# Patient Record
Sex: Male | Born: 1945 | Race: Black or African American | Hispanic: No | Marital: Married | Smoking: Former smoker
Health system: Southern US, Community
[De-identification: ages and names within clinical notes are randomized; demographics above are authoritative.]

## PROBLEM LIST (undated history)

## (undated) DIAGNOSIS — N529 Male erectile dysfunction, unspecified: Secondary | ICD-10-CM

## (undated) DIAGNOSIS — J449 Chronic obstructive pulmonary disease, unspecified: Secondary | ICD-10-CM

## (undated) DIAGNOSIS — R0902 Hypoxemia: Secondary | ICD-10-CM

## (undated) DIAGNOSIS — I739 Peripheral vascular disease, unspecified: Secondary | ICD-10-CM

## (undated) DIAGNOSIS — R0600 Dyspnea, unspecified: Secondary | ICD-10-CM

## (undated) DIAGNOSIS — R0989 Other specified symptoms and signs involving the circulatory and respiratory systems: Secondary | ICD-10-CM

## (undated) DIAGNOSIS — I272 Pulmonary hypertension, unspecified: Secondary | ICD-10-CM

## (undated) DIAGNOSIS — E785 Hyperlipidemia, unspecified: Secondary | ICD-10-CM

## (undated) DIAGNOSIS — J45909 Unspecified asthma, uncomplicated: Secondary | ICD-10-CM

## (undated) DIAGNOSIS — Z87442 Personal history of urinary calculi: Secondary | ICD-10-CM

## (undated) DIAGNOSIS — I10 Essential (primary) hypertension: Secondary | ICD-10-CM

## (undated) DIAGNOSIS — R0609 Other forms of dyspnea: Secondary | ICD-10-CM

## (undated) DIAGNOSIS — E119 Type 2 diabetes mellitus without complications: Secondary | ICD-10-CM

## (undated) HISTORY — PX: OTHER SURGICAL HISTORY: SHX169

## (undated) HISTORY — DX: Personal history of urinary calculi: Z87.442

## (undated) HISTORY — DX: Other specified symptoms and signs involving the circulatory and respiratory systems: R09.89

## (undated) HISTORY — DX: Male erectile dysfunction, unspecified: N52.9

## (undated) HISTORY — DX: Essential (primary) hypertension: I10

## (undated) HISTORY — DX: Peripheral vascular disease, unspecified: I73.9

## (undated) HISTORY — DX: Type 2 diabetes mellitus without complications: E11.9

## (undated) HISTORY — DX: Hyperlipidemia, unspecified: E78.5

---

## 1993-11-11 HISTORY — PX: ROTATOR CUFF REPAIR: SHX139

## 2007-11-06 ENCOUNTER — Inpatient Hospital Stay (HOSPITAL_COMMUNITY): Admission: EM | Admit: 2007-11-06 | Discharge: 2007-11-08 | Payer: Self-pay | Admitting: Emergency Medicine

## 2007-11-07 ENCOUNTER — Ambulatory Visit: Payer: Self-pay | Admitting: Internal Medicine

## 2007-11-09 ENCOUNTER — Ambulatory Visit (HOSPITAL_COMMUNITY): Admission: RE | Admit: 2007-11-09 | Discharge: 2007-11-09 | Payer: Self-pay | Admitting: Internal Medicine

## 2007-11-19 ENCOUNTER — Ambulatory Visit: Payer: Self-pay | Admitting: Internal Medicine

## 2007-11-25 ENCOUNTER — Ambulatory Visit (HOSPITAL_COMMUNITY): Admission: RE | Admit: 2007-11-25 | Discharge: 2007-11-25 | Payer: Self-pay | Admitting: Internal Medicine

## 2007-11-25 ENCOUNTER — Encounter: Payer: Self-pay | Admitting: Internal Medicine

## 2007-11-25 ENCOUNTER — Ambulatory Visit: Payer: Self-pay | Admitting: Internal Medicine

## 2007-11-25 HISTORY — PX: COLONOSCOPY: SHX174

## 2007-12-01 ENCOUNTER — Ambulatory Visit (HOSPITAL_COMMUNITY): Admission: RE | Admit: 2007-12-01 | Discharge: 2007-12-01 | Payer: Self-pay | Admitting: Urology

## 2007-12-28 ENCOUNTER — Encounter (INDEPENDENT_AMBULATORY_CARE_PROVIDER_SITE_OTHER): Payer: Self-pay | Admitting: *Deleted

## 2008-01-20 ENCOUNTER — Ambulatory Visit (HOSPITAL_COMMUNITY): Admission: RE | Admit: 2008-01-20 | Discharge: 2008-01-20 | Payer: Self-pay | Admitting: Urology

## 2008-01-21 ENCOUNTER — Ambulatory Visit (HOSPITAL_COMMUNITY): Admission: RE | Admit: 2008-01-21 | Discharge: 2008-01-21 | Payer: Self-pay | Admitting: Urology

## 2008-02-17 ENCOUNTER — Ambulatory Visit (HOSPITAL_COMMUNITY): Admission: RE | Admit: 2008-02-17 | Discharge: 2008-02-17 | Payer: Self-pay | Admitting: Urology

## 2008-03-01 ENCOUNTER — Ambulatory Visit: Payer: Self-pay | Admitting: Family Medicine

## 2008-03-01 LAB — CONVERTED CEMR LAB: Microalb, Ur: 1.09 mg/dL (ref 0.00–1.89)

## 2008-03-03 ENCOUNTER — Encounter: Payer: Self-pay | Admitting: Family Medicine

## 2008-03-03 LAB — CONVERTED CEMR LAB
AST: 13 units/L (ref 0–37)
Alkaline Phosphatase: 53 units/L (ref 39–117)
Bilirubin, Direct: 0.1 mg/dL (ref 0.0–0.3)
Calcium: 9.6 mg/dL (ref 8.4–10.5)
Cholesterol: 219 mg/dL — ABNORMAL HIGH (ref 0–200)
Eosinophils Absolute: 0.1 10*3/uL (ref 0.0–0.7)
Eosinophils Relative: 2 % (ref 0–5)
HDL: 31 mg/dL — ABNORMAL LOW (ref >39)
Hemoglobin: 15 g/dL (ref 13.0–17.0)
Indirect Bilirubin: 0.5 mg/dL (ref 0.0–0.9)
LDL Cholesterol: 167 mg/dL — ABNORMAL HIGH (ref 0–99)
Lymphs Abs: 2.6 10*3/uL (ref 0.7–4.0)
MCV: 88.7 fL (ref 78.0–100.0)
Potassium: 4.3 meq/L (ref 3.5–5.3)
RBC: 5.03 M/uL (ref 4.22–5.81)
RDW: 13.9 % (ref 11.5–15.5)
Total Bilirubin: 0.6 mg/dL (ref 0.3–1.2)
Total CHOL/HDL Ratio: 7.1
Total Protein: 7.4 g/dL (ref 6.0–8.3)
Triglycerides: 104 mg/dL (ref <150)
VLDL: 21 mg/dL (ref 0–40)

## 2008-03-09 ENCOUNTER — Encounter (INDEPENDENT_AMBULATORY_CARE_PROVIDER_SITE_OTHER): Payer: Self-pay | Admitting: *Deleted

## 2008-03-09 DIAGNOSIS — I10 Essential (primary) hypertension: Secondary | ICD-10-CM

## 2008-05-19 ENCOUNTER — Ambulatory Visit (HOSPITAL_COMMUNITY): Admission: RE | Admit: 2008-05-19 | Discharge: 2008-05-19 | Payer: Self-pay | Admitting: Urology

## 2008-06-06 ENCOUNTER — Ambulatory Visit: Payer: Self-pay | Admitting: Family Medicine

## 2008-06-06 ENCOUNTER — Encounter: Payer: Self-pay | Admitting: Family Medicine

## 2008-06-10 ENCOUNTER — Encounter: Payer: Self-pay | Admitting: Family Medicine

## 2008-06-10 LAB — CONVERTED CEMR LAB
Calcium: 9.4 mg/dL (ref 8.4–10.5)
Chloride: 105 meq/L (ref 96–112)
Cholesterol: 145 mg/dL (ref 0–200)
Creatinine, Ser: 0.94 mg/dL (ref 0.40–1.50)
HDL: 30 mg/dL — ABNORMAL LOW (ref >39)
Total Bilirubin: 0.6 mg/dL (ref 0.3–1.2)
Total Protein: 7.4 g/dL (ref 6.0–8.3)
Triglycerides: 97 mg/dL (ref <150)

## 2008-09-08 ENCOUNTER — Ambulatory Visit: Payer: Self-pay | Admitting: Family Medicine

## 2008-09-08 LAB — CONVERTED CEMR LAB: Hgb A1c MFr Bld: 7.1 %

## 2008-11-11 HISTORY — PX: LITHOTRIPSY: SUR834

## 2008-12-14 ENCOUNTER — Ambulatory Visit: Payer: Self-pay | Admitting: Family Medicine

## 2008-12-14 DIAGNOSIS — N3090 Cystitis, unspecified without hematuria: Secondary | ICD-10-CM | POA: Insufficient documentation

## 2008-12-14 DIAGNOSIS — E785 Hyperlipidemia, unspecified: Secondary | ICD-10-CM | POA: Insufficient documentation

## 2008-12-14 DIAGNOSIS — F528 Other sexual dysfunction not due to a substance or known physiological condition: Secondary | ICD-10-CM

## 2008-12-14 LAB — CONVERTED CEMR LAB: Hgb A1c MFr Bld: 7.8 %

## 2008-12-15 LAB — CONVERTED CEMR LAB
BUN: 13 mg/dL (ref 6–23)
Bilirubin Urine: NEGATIVE
CO2: 23 meq/L (ref 19–32)
Calcium: 9.5 mg/dL (ref 8.4–10.5)
Chloride: 103 meq/L (ref 96–112)
Cholesterol: 137 mg/dL (ref 0–200)
Glucose, Bld: 144 mg/dL — ABNORMAL HIGH (ref 70–99)
HDL: 31 mg/dL — ABNORMAL LOW (ref >39)
Hemoglobin, Urine: NEGATIVE
Ketones, ur: NEGATIVE mg/dL
LDL Cholesterol: 77 mg/dL (ref 0–99)
Nitrite: NEGATIVE
Total CHOL/HDL Ratio: 4.4
Total Protein: 7.5 g/dL (ref 6.0–8.3)
Triglycerides: 145 mg/dL (ref <150)
Urine Glucose: NEGATIVE mg/dL
VLDL: 29 mg/dL (ref 0–40)
pH: 7 (ref 5.0–8.0)

## 2009-01-03 ENCOUNTER — Encounter: Payer: Self-pay | Admitting: Family Medicine

## 2009-03-15 ENCOUNTER — Ambulatory Visit: Payer: Self-pay | Admitting: Family Medicine

## 2009-03-15 LAB — CONVERTED CEMR LAB
Blood Glucose, Fasting: 167 mg/dL
Hgb A1c MFr Bld: 7.1 %

## 2009-03-16 ENCOUNTER — Encounter: Payer: Self-pay | Admitting: Family Medicine

## 2009-03-17 LAB — CONVERTED CEMR LAB
Creatinine, Urine: 233.6 mg/dL
Eosinophils Absolute: 0.2 10*3/uL (ref 0.0–0.7)
Eosinophils Relative: 2 % (ref 0–5)
HCT: 47 % (ref 39.0–52.0)
Lymphocytes Relative: 31 % (ref 12–46)
Lymphs Abs: 2.5 10*3/uL (ref 0.7–4.0)
Microalb Creat Ratio: 11.3 mg/g (ref 0.0–30.0)
Microalb, Ur: 2.63 mg/dL — ABNORMAL HIGH (ref 0.00–1.89)
Monocytes Absolute: 0.6 10*3/uL (ref 0.1–1.0)
Neutro Abs: 4.7 10*3/uL (ref 1.7–7.7)
Neutrophils Relative %: 59 % (ref 43–77)
RDW: 14.4 % (ref 11.5–15.5)

## 2009-04-26 ENCOUNTER — Ambulatory Visit: Payer: Self-pay | Admitting: Family Medicine

## 2009-05-01 DIAGNOSIS — K5289 Other specified noninfective gastroenteritis and colitis: Secondary | ICD-10-CM

## 2009-06-20 ENCOUNTER — Ambulatory Visit (HOSPITAL_COMMUNITY): Admission: RE | Admit: 2009-06-20 | Discharge: 2009-06-20 | Payer: Self-pay | Admitting: Family Medicine

## 2009-06-21 ENCOUNTER — Encounter: Payer: Self-pay | Admitting: Family Medicine

## 2009-06-21 LAB — CONVERTED CEMR LAB
ALT: 14 units/L (ref 0–53)
Calcium: 9.7 mg/dL (ref 8.4–10.5)
Chloride: 107 meq/L (ref 96–112)
Cholesterol: 137 mg/dL (ref 0–200)
Indirect Bilirubin: 0.4 mg/dL (ref 0.0–0.9)
Potassium: 4.5 meq/L (ref 3.5–5.3)
Sodium: 143 meq/L (ref 135–145)
Total Bilirubin: 0.5 mg/dL (ref 0.3–1.2)
Total Protein: 7.1 g/dL (ref 6.0–8.3)
Triglycerides: 72 mg/dL (ref <150)

## 2009-07-03 ENCOUNTER — Ambulatory Visit: Payer: Self-pay | Admitting: Family Medicine

## 2009-10-24 ENCOUNTER — Ambulatory Visit: Payer: Self-pay | Admitting: Family Medicine

## 2009-10-24 LAB — CONVERTED CEMR LAB
Glucose, Bld: 167 mg/dL
OCCULT 1: NEGATIVE

## 2009-10-27 LAB — CONVERTED CEMR LAB
ALT: 10 units/L (ref 0–53)
Albumin: 4.7 g/dL (ref 3.5–5.2)
BUN: 12 mg/dL (ref 6–23)
Bilirubin, Direct: 0.1 mg/dL (ref 0.0–0.3)
CO2: 23 meq/L (ref 19–32)
Chloride: 106 meq/L (ref 96–112)
Cholesterol: 160 mg/dL (ref 0–200)
Glucose, Bld: 142 mg/dL — ABNORMAL HIGH (ref 70–99)
HDL: 34 mg/dL — ABNORMAL LOW (ref >39)
Indirect Bilirubin: 0.2 mg/dL (ref 0.0–0.9)
Potassium: 4.4 meq/L (ref 3.5–5.3)
Total Bilirubin: 0.3 mg/dL (ref 0.3–1.2)
Total Protein: 7.6 g/dL (ref 6.0–8.3)

## 2009-11-02 ENCOUNTER — Ambulatory Visit: Payer: Self-pay | Admitting: Cardiology

## 2009-11-02 DIAGNOSIS — F17200 Nicotine dependence, unspecified, uncomplicated: Secondary | ICD-10-CM

## 2009-11-06 DIAGNOSIS — R0989 Other specified symptoms and signs involving the circulatory and respiratory systems: Secondary | ICD-10-CM | POA: Insufficient documentation

## 2009-11-07 ENCOUNTER — Ambulatory Visit: Payer: Self-pay | Admitting: Cardiology

## 2009-11-07 ENCOUNTER — Encounter (INDEPENDENT_AMBULATORY_CARE_PROVIDER_SITE_OTHER): Payer: Self-pay | Admitting: *Deleted

## 2009-11-07 DIAGNOSIS — R9439 Abnormal result of other cardiovascular function study: Secondary | ICD-10-CM

## 2009-11-14 ENCOUNTER — Encounter: Payer: Self-pay | Admitting: Cardiology

## 2009-11-14 ENCOUNTER — Ambulatory Visit (HOSPITAL_COMMUNITY): Admission: RE | Admit: 2009-11-14 | Discharge: 2009-11-14 | Payer: Self-pay | Admitting: Family Medicine

## 2009-11-28 ENCOUNTER — Ambulatory Visit: Payer: Self-pay | Admitting: Family Medicine

## 2009-11-28 ENCOUNTER — Encounter: Payer: Self-pay | Admitting: Cardiology

## 2009-11-28 ENCOUNTER — Ambulatory Visit: Payer: Self-pay | Admitting: Cardiology

## 2009-11-28 ENCOUNTER — Ambulatory Visit (HOSPITAL_COMMUNITY): Admission: RE | Admit: 2009-11-28 | Discharge: 2009-11-28 | Payer: Self-pay | Admitting: Cardiology

## 2010-01-25 ENCOUNTER — Ambulatory Visit: Payer: Self-pay | Admitting: Family Medicine

## 2010-01-25 DIAGNOSIS — R5381 Other malaise: Secondary | ICD-10-CM

## 2010-01-25 DIAGNOSIS — R5383 Other fatigue: Secondary | ICD-10-CM

## 2010-01-29 ENCOUNTER — Encounter: Payer: Self-pay | Admitting: Family Medicine

## 2010-05-31 ENCOUNTER — Ambulatory Visit: Payer: Self-pay | Admitting: Family Medicine

## 2010-05-31 DIAGNOSIS — I739 Peripheral vascular disease, unspecified: Secondary | ICD-10-CM

## 2010-05-31 DIAGNOSIS — L259 Unspecified contact dermatitis, unspecified cause: Secondary | ICD-10-CM

## 2010-06-01 LAB — CONVERTED CEMR LAB
BUN: 14 mg/dL (ref 6–23)
Basophils Absolute: 0 10*3/uL (ref 0.0–0.1)
Eosinophils Absolute: 0.2 10*3/uL (ref 0.0–0.7)
Eosinophils Relative: 2 % (ref 0–5)
HCT: 45.1 % (ref 39.0–52.0)
HDL: 30 mg/dL — ABNORMAL LOW (ref >39)
Hemoglobin: 15.1 g/dL (ref 13.0–17.0)
LDL Cholesterol: 94 mg/dL (ref 0–99)
Lymphocytes Relative: 27 % (ref 12–46)
Lymphs Abs: 2.2 10*3/uL (ref 0.7–4.0)
MCHC: 33.5 g/dL (ref 30.0–36.0)
Microalb Creat Ratio: 8.9 mg/g (ref 0.0–30.0)
Microalb, Ur: 1.66 mg/dL (ref 0.00–1.89)
Monocytes Absolute: 0.4 10*3/uL (ref 0.1–1.0)
Neutro Abs: 5.4 10*3/uL (ref 1.7–7.7)
Neutrophils Relative %: 66 % (ref 43–77)
PSA: 0.37 ng/mL (ref 0.10–4.00)
Potassium: 4.6 meq/L (ref 3.5–5.3)
RBC: 5.06 M/uL (ref 4.22–5.81)
Sodium: 140 meq/L (ref 135–145)

## 2010-06-07 ENCOUNTER — Ambulatory Visit (HOSPITAL_COMMUNITY)
Admission: RE | Admit: 2010-06-07 | Discharge: 2010-06-07 | Payer: Self-pay | Source: Home / Self Care | Admitting: Family Medicine

## 2010-06-16 ENCOUNTER — Encounter: Payer: Self-pay | Admitting: Family Medicine

## 2010-08-02 ENCOUNTER — Ambulatory Visit: Payer: Self-pay | Admitting: Family Medicine

## 2010-08-27 ENCOUNTER — Telehealth: Payer: Self-pay | Admitting: Family Medicine

## 2010-08-27 ENCOUNTER — Ambulatory Visit: Payer: Self-pay | Admitting: Family Medicine

## 2010-08-27 DIAGNOSIS — J209 Acute bronchitis, unspecified: Secondary | ICD-10-CM | POA: Insufficient documentation

## 2010-08-27 DIAGNOSIS — J019 Acute sinusitis, unspecified: Secondary | ICD-10-CM | POA: Insufficient documentation

## 2010-09-14 ENCOUNTER — Ambulatory Visit: Payer: Self-pay | Admitting: Family Medicine

## 2010-09-17 LAB — CONVERTED CEMR LAB
BUN: 14 mg/dL (ref 6–23)
Calcium: 10.2 mg/dL (ref 8.4–10.5)
Creatinine, Ser: 0.94 mg/dL (ref 0.40–1.50)
Glucose, Bld: 142 mg/dL — ABNORMAL HIGH (ref 70–99)
Potassium: 4.7 meq/L (ref 3.5–5.3)
Sodium: 145 meq/L (ref 135–145)
Testosterone: 295.67 ng/dL (ref 250–890)

## 2010-12-11 NOTE — Assessment & Plan Note (Signed)
Summary: cold   Vital Signs:  Patient profile:   65 year old male Weight:      195.04 pounds Pulse rhythm:   regular Resp:     14 per minute BP sitting:   150 / 90  (right arm)  Primary Care Provider:  Dr. Tula Nakayama   History of Present Illness: 1 week h/o  cough and chest congestion, also has has clear nasal drainage. No fever or chills.Symptoms are worsening anfd at times his sputum is yellow. e has sleep disturbance and fatigue. Prior to tis he hd been doing well.Reports  that they are doing well.  Denies chest pain, palpitations, PND, orthopnea or leg swelling. Denies abdominal pain, nausea, vomitting, diarrhea or constipation. Denies change in bowel movements or bloody stool. Denies dysuria , frequency, incontinence or hesitancy. Denies  joint pain, swelling, or reduced mobility. Denies headaches, vertigo, seizures. Denies depression, anxiety or insomnia. Denies  rash, lesions, or itch.     Allergies: No Known Drug Allergies  Review of Systems      See HPI General:  Complains of chills, fatigue, malaise, and sleep disorder. ENT:  Complains of nasal congestion, postnasal drainage, and sinus pressure. Resp:  Complains of cough, shortness of breath, sputum productive, and wheezing. Endo:  Denies excessive thirst and excessive urination. Heme:  Denies abnormal bruising and bleeding. Allergy:  Denies hives or rash and itching eyes.  Physical Exam  General:  Well-developed,well-nourished,in no acute distress; alert,appropriate and cooperative throughout examinationill appearing. HEENT: No facial asymmetry,  EOMI, frontal and maxillary  sinus tenderness, TM's Clear, oropharynx  pink and moist.   Chest: decreased air entry, scattered crackles and wheezes.  CVS: S1, S2, No murmurs, No S3.   Abd: Soft, Nontender.  MS: Adequate ROM spine, hips, shoulders and knees.  Ext: No edema.   CNS: CN 2-12 intact, power tone and sensation normal throughout.   Skin:  Intact,decreased hyperpigmentation of the skin on his legs, no ulceration Psych: Good eye contact, normal affect.  Memory intact, not anxious or depressed appearing.    Impression & Recommendations:  Problem # 1:  ACUTE BRONCHITIS (ICD-466.0) Assessment Comment Only  His updated medication list for this problem includes:    Penicillin V Potassium 500 Mg Tabs (Penicillin v potassium) .[name]... Take 1 tablet by mouth three times a day  Orders: Depo- Medrol 78m (J1040) Rocephin  2543m([id number] of Therapeutic Inj  intramuscular or subcutaneous ([id number] Electronic Prescription ([id number] Problem # 2:  ACUTE SINUSITIS, UNSPECIFIED (ICD-461.9) Assessment: Comment Only  His updated medication list for this problem includes:    Penicillin V Potassium 500 Mg Tabs (Penicillin v potassium) ...[name]. Take 1 tablet by mouth three times a day  Problem # 3:  DERMATITIS (ICD-692.9) Assessment: Improved  Problem # 4:  HYPERTENSION (ICD-401.9) Assessment: Improved  His updated medication list for this problem includes:    Lotensin 40 Mg Tabs (Benazepril hcl) ...[name]. Take 1 tablet by mouth once a day    Amlodipine Besylate 2.5 Mg Tabs (Amlodipine besylate) ...[name]. Take 1 tablet by mouth once a day Patient advised to follow low sodium diet rich in fruit and vegetables, and to commit to at least 30 minutes 5 days per week of regular exercise , to improve blood presure control.   Orders: T-Basic Metabolic Panel ([id number] BP today: 150/90 Prior BP: 160/100 (08/02/2010)  Prior 10 Yr Risk Heart Disease: 47 % (11/07/2009)  Labs Reviewed: K+: 4.6 (05/31/2010) Creat: : 1.01 (05/31/2010)   Chol:  151 (05/31/2010)   HDL: 30 (05/31/2010)   LDL: 94 (05/31/2010)   TG: 137 (05/31/2010)  Problem # 5:  HYPERLIPIDEMIA (ICD-272.4) Assessment: Comment Only  His updated medication list for this problem includes:    Lovastatin 40 Mg Tabs (Lovastatin) .[name]... Take 1 tab by mouth at bedtime  Labs  Reviewed: SGOT: 13 (10/24/2009)   SGPT: 10 (10/24/2009)  Prior 10 Yr Risk Heart Disease: 47 % (11/07/2009)   HDL:30 (05/31/2010), 34 (10/24/2009)  LDL:94 (05/31/2010), 106 (10/24/2009)  Chol:151 (05/31/2010), 160 (10/24/2009)  Trig:137 (05/31/2010), 102 (10/24/2009)  Problem # 6:  DIABETES MELLITUS-NO INSULIN (ICD-250.00) Assessment: Comment Only  His updated medication list for this problem includes:    Metformin Hcl 1000 Mg Tabs (Metformin hcl) .[name]... Take 1 tablet by mouth two times a day    Lotensin 40 Mg Tabs (Benazepril hcl) .[name]... Take 1 tablet by mouth once a day Patient advised to reduce carbs and sweets, commit to regular physical activity, take meds as prescribed, test blood sugars as directed, and attempt to lose weight , to improve blood sugar control.  Orders: T- Hemoglobin A1C ([id number])  Labs Reviewed: Creat: 1.01 (05/31/2010)     Last Eye Exam: normal (10/24/2009) Reviewed HgBA1c results: 7.0 (05/31/2010)  6.7 (01/25/2010)  Problem # 7:  TOBACCO ABUSE (ICD-305.1) Assessment: Unchanged  Encouraged smoking cessation and discussed different methods for smoking cessation.   Complete Medication List: 1)  Lovastatin 40 Mg Tabs (Lovastatin) .... Take 1 tab by mouth at bedtime 2)  Metformin Hcl 1000 Mg Tabs (Metformin hcl) .... Take 1 tablet by mouth two times a day 3)  Lotensin 40 Mg Tabs (Benazepril hcl) .... Take 1 tablet by mouth once a day 4)  Amlodipine Besylate 2.5 Mg Tabs (Amlodipine besylate) .... Take 1 tablet by mouth once a day 5)  Penicillin V Potassium 500 Mg Tabs (Penicillin v potassium) .... Take 1 tablet by mouth three times a day  Other Orders: T-TSH ([id number])  Patient Instructions: 1)  f/u as before. 2)  you are being treated for uncontrolled allergies with flare of cOPD/bronchitis. 3)  you will  get rocephin and depomedrol in the office. 4)  Meds are also being sent in for infection and allergies pls take all. 5)  Delsyn 1 teaspoon 3 times  daily for 6 days, samples from the office  will be given Prescriptions: PREDNISONE (PAK) 5 MG TABS (PREDNISONE) Use as directed  #21 x 0   Entered and Authorized by:   Tula Nakayama MD   Signed by:   Tula Nakayama MD on 08/27/2010   Method used:   Electronically to        [facility] [phone]* (retail)       [address] [place]  [address]       Ph: [phone]       Fax: [phone]   RxID:   [id number] PENICILLIN V POTASSIUM 500 MG TABS (PENICILLIN V POTASSIUM) Take 1 tablet by mouth three times a day  #30 x 0   Entered and Authorized by:   Tula Nakayama MD   Signed by:   Tula Nakayama MD on 08/27/2010   Method used:   Electronically to        [facility] [phone]* (retail)       [address] [place]  [address]       Ph: [phone]  Fax: [phone]   RxID:   [id number]    Medication Administration  Injection # 1:    Medication: Depo- Medrol 61m    Diagnosis: ACUTE BRONCHITIS (ICD-466.0)    Route: IM    Site: R deltoid    Exp Date: 06/12    Lot #: [id number]    Mfr: Pharmacia    Patient tolerated injection without complications    Given by: JBaldomero LamyLPN (October 17, 270964:40 PM)  Injection # 2:    Medication: Rocephin  2552m   Diagnosis: ACUTE BRONCHITIS (ICD-466.0)    Route: IM    Site: RUOQ gluteus    Exp Date: 05/14    Lot #: [id number]  Mfr: novaplus    Comments: rocephin 50012miven    Patient tolerated injection without complications    Given by: JaiBaldomero LamyN (October 17, 201840341 PM)  Orders Added: 1)  Est. Patient Level IV [99214] 2)  T-Basic Metabolic Panel [[id number]]  T- Hemoglobin A1C [83036-23375] 4)  T-TSH [84[[id number]]  Depo- Medrol 23m55m1040] 6)  Rocephin  250mg38m696] 7)  Admin of Therapeutic Inj  intramuscular or subcutaneous [96372] 8)  Medicare Electronic Prescription [G855[H9093]Medication Administration  Injection # 1:    Medication: Depo-  Medrol 23mg 68miagnosis: ACUTE BRONCHITIS (ICD-466.0)    Route: IM    Site: R deltoid    Exp Date: 06/12    Lot #: [id number]    Mfr: Pharmacia    Patient tolerated injection without complications    Given by: Jaime Baldomero LamyOctober 17, 2011 41121PM)  Injection # 2:    Medication: Rocephin  250mg  19magnosis: ACUTE BRONCHITIS (ICD-466.0)    Route: IM    Site: RUOQ gluteus    Exp Date: 05/14    Lot #: [id number]: novaplus    Comments: rocephin 500mg gi53m   Patient tolerated injection without complications    Given by: Jaime BoBaldomero Lamytober 17, 2011 4:45072)  Orders Added: 1)  Est. Patient Level IV [99214] [25750]asic Metabolic Panel [80048-2[51833-58251]Hemoglobin A1C [83036-23375] 4)  T-TSH [84443-2[89842-10312]o- Medrol 23mg [J169m 6)  Rocephin  250mg [J0663m7)  Admin of Therapeutic Inj  intramuscular or subcutaneous [96372] 8)  Medicare Electronic Prescription [G8553][O1188]

## 2010-12-11 NOTE — Progress Notes (Signed)
Summary: sick  Phone Note Call from Patient   Summary of Call: pt thinks he has a sinus infection and wants to see doc. but no appts today. can he get a antibotic called in [phone] Initial call taken by: Lenn Cal,  August 27, 2010 11:09 AM  Follow-up for Phone Call        no fever, no chills or body aches  complains of cough runny nose, clear mucous  states been going on for a couple weeks has tried otc meds Follow-up by: Baldomero Lamy LPN,  August 27, 9233 11:57 AM  Additional Follow-up for Phone Call Additional follow up Details #1::        pls set up appt with PA Additional Follow-up by: Tula Nakayama MD,  August 27, 2010 1:09 PM    Additional Follow-up for Phone Call Additional follow up Details #2::    [place] 10.18.11 @ 8:30 AND HE SAID THAT HE WOULD JUST GO TO URGENT CARE . THERE IS NOTHING OPEN EVEN WITH DAWN Follow-up by: Dierdre Harness,  August 27, 2010 1:18 PM  Additional Follow-up for Phone Call Additional follow up Details #3:: Details for Additional Follow-up Action Taken: seen today Additional Follow-up by: Tula Nakayama MD,  August 27, 2010 5:13 PM

## 2010-12-11 NOTE — Assessment & Plan Note (Signed)
Summary: office visit   Vital Signs:  Patient profile:   65 year old male Height:      75 inches Weight:      195 pounds BMI:     24.46 O2 Sat:      96 % Pulse rate:   78 / minute Pulse rhythm:   regular Resp:     16 per minute BP sitting:   148 / 78  (left arm) Cuff size:   regular  Vitals Entered By: Kate Sable LPN (January 25, 5461 7:03 AM) CC: Follow up chronic problems   Primary Care Provider:  Dr. Tula Nakayama  CC:  Follow up chronic problems.  History of Present Illness: Reports  thathe has been  doing well. Denies recent fever or chills. Denies sinus pressure, nasal congestion , ear pain or sore throat. Denies chest congestion, or cough productive of sputum. Denies chest pain, palpitations, PND, orthopnea or leg swelling. Denies abdominal pain, nausea, vomitting, diarrhea or constipation. Denies change in bowel movements or bloody stool. Denies dysuria , frequency, incontinence or hesitancy. Denies  joint pain, swelling, or reduced mobility. Denies headaches, vertigo, seizures. Denies depression, anxiety or insomnia. Denies  rash, lesions, or itch.     Preventive Screening-Counseling & Management  Alcohol-Tobacco     Smoking Cessation Counseling: yes  Current Medications (verified): 1)  Lovastatin 40 Mg  Tabs (Lovastatin) .... Take 1 Tab By Mouth At Bedtime 2)  Metformin Hcl 1000 Mg  Tabs (Metformin Hcl) .... Take 1 Tablet By Mouth Two Times A Day 3)  Lotensin 40 Mg Tabs (Benazepril Hcl) .... Take 1 Tablet By Mouth Once A Day  Allergies (verified): No Known Drug Allergies  Review of Systems      See HPI Eyes:  Denies blurring, discharge, eye pain, and red eye. Endo:  Denies cold intolerance, excessive hunger, excessive thirst, excessive urination, heat intolerance, polyuria, and weight change. Heme:  Denies abnormal bruising and bleeding. Allergy:  Complains of seasonal allergies; denies hives or rash and itching eyes.  Physical Exam  General:   Well-developed,well-nourished,in no acute distress; alert,appropriate and cooperative throughout examination HEENT: No facial asymmetry,  EOMI, No sinus tenderness, TM's Clear, oropharynx  pink and moist.   Chest: Clear to auscultation bilaterally.  CVS: S1, S2, No murmurs, No S3.   Abd: Soft, Nontender.  MS: Adequate ROM spine, hips, shoulders and knees.  Ext: No edema.   CNS: CN 2-12 intact, power tone and sensation normal throughout.   Skin: Intact, no visible lesions or rashes.  Psych: Good eye contact, normal affect.  Memory intact, not anxious or depressed appearing.    Impression & Recommendations:  Problem # 1:  HYPERTENSION (ICD-401.9) Assessment Deteriorated  The following medications were removed from the medication list:    Chlorthalidone 25 Mg Tabs (Chlorthalidone) .[name]... Take one half  tablet by mouth daily His updated medication list for this problem includes:    Lotensin 40 Mg Tabs (Benazepril hcl) .[name]... Take 1 tablet by mouth once a day  Orders: T-Basic Metabolic Panel ([id number])  BP today: 148/78, has not taking med today Prior BP: 120/74 (11/28/2009)  Prior 10 Yr Risk Heart Disease: 47 % (11/07/2009)  Labs Reviewed: K+: 4.4 (10/24/2009) Creat: : 0.96 (10/24/2009)   Chol: 160 (10/24/2009)   HDL: 34 (10/24/2009)   LDL: 106 (10/24/2009)   TG: 102 (10/24/2009)  Problem # 2:  HYPERLIPIDEMIA (ICD-272.4) Assessment: Deteriorated  His updated medication list for this problem includes:    Lovastatin 40 Mg Tabs (  Lovastatin) .[name]... Take 1 tab by mouth at bedtime  Orders: T-Hepatic Function [id number]) T-Lipid Profile [phone])  Labs Reviewed: SGOT: 13 (10/24/2009)   SGPT: 10 (10/24/2009)  Prior 10 Yr Risk Heart Disease: 47 % (11/07/2009)   HDL:34 (10/24/2009), 31 (06/21/2009)  LDL:106 (10/24/2009), 92 (06/21/2009)  Chol:160 (10/24/2009), 137 (06/21/2009)  Trig:102 (10/24/2009), 72 (06/21/2009)  Problem # 3:  TOBACCO ABUSE (ICD-305.1) Assessment:  Unchanged  Encouraged smoking cessation and discussed different methods for smoking cessation.   Problem # 4:  DIABETES MELLITUS-NO INSULIN (ICD-250.00) Assessment: Comment Only  His updated medication list for this problem includes:    Metformin Hcl 1000 Mg Tabs (Metformin hcl) .[name]... Take 1 tablet by mouth two times a day    Lotensin 40 Mg Tabs (Benazepril hcl) .[name]... Take 1 tablet by mouth once a day  Orders: T- Hemoglobin A1C ([id number]) T- Hemoglobin A1C ([id number]) T-Urine Microalbumin w/creat. ratio [phone])  Labs Reviewed: Creat: 0.96 (10/24/2009)     Last Eye Exam: normal (10/24/2009) Reviewed HgBA1c results: 6.7 (10/24/2009)  6.7 (07/03/2009)  Complete Medication List: 1)  Lovastatin 40 Mg Tabs (Lovastatin) .... Take 1 tab by mouth at bedtime 2)  Metformin Hcl 1000 Mg Tabs (Metformin hcl) .... Take 1 tablet by mouth two times a day 3)  Lotensin 40 Mg Tabs (Benazepril hcl) .... Take 1 tablet by mouth once a day  Other Orders: T-CBC w/Diff ([id number]) T-PSA ([id number])  Patient Instructions: 1)  Please schedule a follow-up appointment in 4 months. 2)  It is important that you exercise regularly at least 20 minutes 5 times a week. If you develop chest pain, have severe difficulty breathing, or feel very tired , stop exercising immediately and seek medical attention. 3)  Tobacco is very bad for your health and your loved ones! You Should stop smoking!. 4)  Stop Smoking Tips: Choose a Quit date. Cut down before the Quit date. decide what you will do as a substitute when you feel the urge to smoke(gum,toothpick,exercise). 5)  bp  is 148/80, pls make changes discussed. 6)  hBA1c today 7)  BMP prior to visit, ICD-9: 8)  Hepatic Panel prior to visit, ICD-9: 9)  Lipid Panel prior to visit, ICD-9: 10)  CBC w/ Diff prior to visit, ICD-9:   fasting in 3.5 months 11)  HbgA1C prior to visit, ICD-9: 12)  Urine Microalbumin prior to visit, ICD-9: 13)  pSA

## 2010-12-11 NOTE — Letter (Signed)
Summary: Letter  Letter   Imported By: Dierdre Harness 01/29/2010 15:32:52  _____________________________________________________________________  External Attachment:    Type:   Image     Comment:   External Document

## 2010-12-11 NOTE — Assessment & Plan Note (Signed)
Summary: office visit   Vital Signs:  Patient profile:   65 year old male Height:      75 inches Weight:      196 pounds BMI:     24.59 O2 Sat:      95 % Pulse rate:   83 / minute Pulse rhythm:   regular Resp:     16 per minute BP sitting:   160 / 100  (right arm)  Vitals Entered By: Kate Sable LPN (August 02, 1609 8:35 AM) CC: Follow up chronic problems    Primary Care Provider:  Dr. Tula Nakayama  CC:  Follow up chronic problems .  History of Present Illness: Reports  that he has been doing well. He has cut down to.75 PPD , and is trying to quit. He unfortunately has not taken his amlodipine tabs for over 1 mth,andwillbe checking to see if it ios at home Denies recent fever or chills. Denies sinus pressure, nasal congestion , ear pain or sore throat. Denies chest congestion, or cough productive of sputum. Denies chest pain, palpitations, PND, orthopnea or leg swelling. Denies abdominal pain, nausea, vomitting, diarrhea or constipation. Denies change in bowel movements or bloody stool. Denies dysuria , frequency, incontinence or hesitancy. Denies  joint pain, swelling, or reduced mobility. Denies headaches, vertigo, seizures. Denies depression, anxiety or insomnia. reports improvement in the rash on his legs, using temovate and elevating legs.     Preventive Screening-Counseling & Management  Alcohol-Tobacco     Smoking Cessation Counseling: yes  Allergies: No Known Drug Allergies  Review of Systems      See HPI Eyes:  Denies discharge, eye pain, and red eye. Derm:  Complains of itching and rash; improvement in stasis dermatits on temovate and elevating legs. Endo:  Denies excessive thirst and excessive urination; increased sweet tea, not testing sugars. Heme:  Denies abnormal bruising and bleeding. Allergy:  Complains of seasonal allergies; denies hives or rash and itching eyes.  Physical Exam  General:  Well-developed,well-nourished,in no acute  distress; alert,appropriate and cooperative throughout examination HEENT: No facial asymmetry,  EOMI, No sinus tenderness, TM's Clear, oropharynx  pink and moist.   Chest: Clear to auscultation bilaterally.  CVS: S1, S2, No murmurs, No S3.   Abd: Soft, Nontender.  MS: Adequate ROM spine, hips, shoulders and knees.  Ext: No edema.   CNS: CN 2-12 intact, power tone and sensation normal throughout.   Skin: Intact,decreased hyperpigmentation of the skin on his legs, no ulceration Psych: Good eye contact, normal affect.  Memory intact, not anxious or depressed appearing.    Impression & Recommendations:  Problem # 1:  HYPERLIPIDEMIA (RUE-454.4) Assessment Improved  His updated medication list for this problem includes:    Lovastatin 40 Mg Tabs (Lovastatin) .[name]... Take 1 tab by mouth at bedtime Low fat dietdiscussed and encouraged  Labs Reviewed: SGOT: 13 (10/24/2009)   SGPT: 10 (10/24/2009)  Prior 10 Yr Risk Heart Disease: 47 % (11/07/2009)   HDL:30 (05/31/2010), 34 (10/24/2009)  LDL:94 (05/31/2010), 106 (10/24/2009)  Chol:151 (05/31/2010), 160 (10/24/2009)  Trig:137 (05/31/2010), 102 (10/24/2009)  Problem # 2:  DERMATITIS (ICD-692.9) Assessment: Improved  Problem # 3:  TOBACCO ABUSE (ICD-305.1) Assessment: Unchanged  Encouraged smoking cessation and discussed different methods for smoking cessation.   Problem # 4:  HYPERTENSION (ICD-401.9) Assessment: Deteriorated  His updated medication list for this problem includes:    Lotensin 40 Mg Tabs (Benazepril hcl) .[name]... Take 1 tablet by mouth once a day    Amlodipine Besylate  2.5 Mg Tabs (Amlodipine besylate) .[name]... Take 1 tablet by mouth once a day pt does not have the amlodipine BP today: 160/100 Prior BP: 152/90 (05/31/2010)  Prior 10 Yr Risk Heart Disease: 47 % (11/07/2009)  Labs Reviewed: K+: 4.6 (05/31/2010) Creat: : 1.01 (05/31/2010)   Chol: 151 (05/31/2010)   HDL: 30 (05/31/2010)   LDL: 94 (05/31/2010)   TG: 137  (05/31/2010)  Problem # 5:  DIABETES MELLITUS-NO INSULIN (ICD-250.00) Assessment: Comment Only  His updated medication list for this problem includes:    Metformin Hcl 1000 Mg Tabs (Metformin hcl) .[name]... Take 1 tablet by mouth two times a day    Lotensin 40 Mg Tabs (Benazepril hcl) .[name]... Take 1 tablet by mouth once a day Patient advised to reduce carbs and sweets, commit to regular physical activity, take meds as prescribed, test blood sugars as directed, and attempt to lose weight , to improve blood sugar control.  Labs Reviewed: Creat: 1.01 (05/31/2010)     Last Eye Exam: normal (10/24/2009) Reviewed HgBA1c results: 7.0 (05/31/2010)  6.7 (01/25/2010)  Complete Medication List: 1)  Lovastatin 40 Mg Tabs (Lovastatin) .... Take 1 tab by mouth at bedtime 2)  Metformin Hcl 1000 Mg Tabs (Metformin hcl) .... Take 1 tablet by mouth two times a day 3)  Lotensin 40 Mg Tabs (Benazepril hcl) .... Take 1 tablet by mouth once a day 4)  Amlodipine Besylate 2.5 Mg Tabs (Amlodipine besylate) .... Take 1 tablet by mouth once a day  Patient Instructions: 1)  F/U in 6 weeks 2)  Your BP is too high, PLS find your otherpill, amlodipine and start taking it also. 3)  It is important that you exercise regularly at least 20 minutes 5 times a week. If you develop chest pain, have severe difficulty breathing, or feel very tired , stop exercising immediately and seek medical attention. 4)  You need to lose weight. Consider a lower calorie diet and regular exercise.  5)  Tobacco is very bad for your health and your loved ones! You Should stop smoking!. 6)  Stop Smoking Tips: Choose a Quit date. Cut down before the Quit date. decide what you will do as a substitute when you feel the urge to smoke(gum,toothpick,exercise). 7)  The medication list was reviewed and reconciled..All changed/newly prescribed medications were explained. A complete medication list was provided to the patient/caregiver.

## 2010-12-11 NOTE — Assessment & Plan Note (Signed)
Summary: office visit   Vital Signs:  Patient profile:   65 year old male Height:      75 inches Weight:      194 pounds BMI:     24.34 O2 Sat:      98 % Pulse rate:   84 / minute Pulse rhythm:   irregular Resp:     16 per minute BP sitting:   152 / 90  (left arm) Cuff size:   regular  Vitals Entered By: Kate Sable LPN (May 31, 7528 5:53 AM) CC: Follow up chronic problems   Primary Care Provider:  Dr. Tula Nakayama  CC:  Follow up chronic problems.  History of Present Illness: Reports  that he has been  doing well. His nicotine use has not changed since his last visit , however he realizes the need to change this. Denies recent fever or chills. Denies sinus pressure, nasal congestion , ear pain or sore throat. Denies chest congestion, or cough productive of sputum. Denies chest pain, palpitations, PND, orthopnea or leg swelling. Denies abdominal pain, nausea, vomitting, diarrhea or constipation. Denies change in bowel movements or bloody stool. Denies dysuria , frequency, incontinence or hesitancy. Occasional   joint pain, swelling, and reduced mobility. Denies headaches, vertigo, seizures. Denies depression, anxiety or insomnia. c/o hyperpigmented rash on both lower extremeties     Current Medications (verified): 1)  Lovastatin 40 Mg  Tabs (Lovastatin) .... Take 1 Tab By Mouth At Bedtime 2)  Metformin Hcl 1000 Mg  Tabs (Metformin Hcl) .... Take 1 Tablet By Mouth Two Times A Day 3)  Lotensin 40 Mg Tabs (Benazepril Hcl) .... Take 1 Tablet By Mouth Once A Day  Allergies (verified): No Known Drug Allergies  Review of Systems      See HPI General:  Complains of fatigue. Eyes:  Denies double vision and red eye. MS:  Complains of stiffness; intermiyyrnt stiffness of knees nd shoulders. Derm:  Complains of changes in color of skin and lesion(s); both lower extremeties, distal to mid calf. Endo:  Denies cold intolerance, excessive hunger, excessive thirst,  excessive urination, heat intolerance, polyuria, and weight change; test infrequently, but when he does, regardless of the time of day, his blood sugars are under 130. Heme:  Denies abnormal bruising and bleeding. Allergy:  Complains of seasonal allergies; denies hives or rash and itching eyes.  Physical Exam  General:  Well-developed,well-nourished,in no acute distress; alert,appropriate and cooperative throughout examination HEENT: No facial asymmetry,  EOMI, No sinus tenderness, TM's Clear, oropharynx  pink and moist.   Chest: Clear to auscultation bilaterally.  CVS: S1, S2, No murmurs, No S3. Decreased dP bilaterally, left greater than ight  Abd: Soft, Nontender.  MS: Adequate ROM spine, hips, shoulders and knees.  Ext: No edema.   CNS: CN 2-12 intact, power tone and sensation normal throughout.   Skin: Intact, hyperpigmented macular lesions on both lower extremeties  Psych: Good eye contact, normal affect.  Memory intact, not anxious or depressed appearing.   Diabetes Management Exam:    Foot Exam (with socks and/or shoes not present):       Sensory-Monofilament:          Left foot: diminished          Right foot: diminished       Inspection:          Left foot: normal          Right foot: normal       Nails:  Left foot: normal          Right foot: normal   Impression & Recommendations:  Problem # 1:  DERMATITIS (ICD-692.9) Assessment Deteriorated  Orders: Dermatology Referral (Derma)  Problem # 2:  PERIPHERAL VASCULAR DISEASE (ICD-443.9) Assessment: Comment Only  Orders: Radiology Referral (Radiology), decreased dorsalis pedis in left lower ext with skin changes suggestive of PVD bilaterally  Problem # 3:  HYPERLIPIDEMIA (ICD-272.4) Assessment: Comment Only  His updated medication list for this problem includes:    Lovastatin 40 Mg Tabs (Lovastatin) .[name]... Take 1 tab by mouth at bedtime  Orders: T-Lipid Profile ([id number]), past due  Labs  Reviewed: SGOT: 13 (10/24/2009)   SGPT: 10 (10/24/2009)  Prior 10 Yr Risk Heart Disease: 47 % (11/07/2009)   HDL:34 (10/24/2009), 31 (06/21/2009)  LDL:106 (10/24/2009), 92 (06/21/2009)  Chol:160 (10/24/2009), 137 (06/21/2009)  Trig:102 (10/24/2009), 72 (06/21/2009)  Problem # 4:  HYPERTENSION (ICD-401.9) Assessment: Deteriorated  His updated medication list for this problem includes:    Lotensin 40 Mg Tabs (Benazepril hcl) .[name]... Take 1 tablet by mouth once a day    Amlodipine Besylate 2.5 Mg Tabs (Amlodipine besylate) .[name]... Take 1 tablet by mouth once a day  Orders: T-Basic Metabolic Panel ([id number])  BP today: 152/90 Prior BP: 148/78 (01/25/2010)  Prior 10 Yr Risk Heart Disease: 47 % (11/07/2009)  Labs Reviewed: K+: 4.4 (10/24/2009) Creat: : 0.96 (10/24/2009)   Chol: 160 (10/24/2009)   HDL: 34 (10/24/2009)   LDL: 106 (10/24/2009)   TG: 102 (10/24/2009)  Problem # 5:  TOBACCO ABUSE (ICD-305.1) Assessment: Unchanged  Encouraged smoking cessation and discussed different methods for smoking cessation.   Problem # 6:  DIABETES MELLITUS-NO INSULIN (ICD-250.00) Assessment: Comment Only  His updated medication list for this problem includes:    Metformin Hcl 1000 Mg Tabs (Metformin hcl) .[name]... Take 1 tablet by mouth two times a day    Lotensin 40 Mg Tabs (Benazepril hcl) .[name]... Take 1 tablet by mouth once a day  Orders: T- Hemoglobin A1C ([id number]) T-Urine Microalbumin w/creat. ratio [id number])  Labs Reviewed: Creat: 0.96 (10/24/2009)     Last Eye Exam: normal (10/24/2009) Reviewed HgBA1c results: 6.7 (01/25/2010)  6.7 (10/24/2009)  Complete Medication List: 1)  Lovastatin 40 Mg Tabs (Lovastatin) .... Take 1 tab by mouth at bedtime 2)  Metformin Hcl 1000 Mg Tabs (Metformin hcl) .... Take 1 tablet by mouth two times a day 3)  Lotensin 40 Mg Tabs (Benazepril hcl) .... Take 1 tablet by mouth once a day 4)  Amlodipine Besylate 2.5 Mg Tabs (Amlodipine besylate)  .... Take 1 tablet by mouth once a day  Other Orders: T-CBC w/Diff ([id number]) T-PSA ([id number])  Patient Instructions: 1)  f/u in 2 months. 2)  new med for your bP pls continue all other meds. 3)  pls cut back on smoking as we discussed , goal is 7/day. 4)  BMP prior to visit, ICD-9:, with egfr 5)  Lipid Panel prior to visit, ICD-9: 6)  CBC w/ Diff prior to visit, ICD-9:   today 7)  PSA prior to visit, ICD-9: 8)  HbgA1C prior to visit, ICD-9: 9)  Urine Microalbumin prior to visit, ICD-9: 10)  you will be referred for testing of your circulationan also to the dermatologist 11)  pls continue to enjoy life Prescriptions: AMLODIPINE BESYLATE 2.5 MG TABS (AMLODIPINE BESYLATE) Take 1 tablet by mouth once a day  #90 x 1   Entered by:   Kate Sable LPN   Authorized by:   Joycelyn Schmid  Simpson MD   Signed by:   Kate Sable LPN on 09/07/9021   Method used:   Faxed to ...       [facility])       [place]  [address]       Ph: [phone]       Fax: [phone]   RxID:   [id number] AMLODIPINE BESYLATE 2.5 MG TABS (AMLODIPINE BESYLATE) Take 1 tablet by mouth once a day  #30 x 0   Entered by:   Kate Sable LPN   Authorized by:   Tula Nakayama MD   Signed by:   Kate Sable LPN on 97/18/2099   Method used:   Electronically to        [facility]. [phone]* (retail)       [address]       [place], [place]  [address]       Ph: [phone] or [phone]       Fax: [phone]   RxID:   [id number] AMLODIPINE BESYLATE 2.5 MG TABS (AMLODIPINE BESYLATE) Take 1 tablet by mouth once a day  #30 x 4   Entered and Authorized by:   Tula Nakayama MD   Signed by:   Tula Nakayama MD on 05/31/2010   Method used:   Printed then faxed to ...       [facility]  [address]       Ph: [phone]       Fax: [phone]   RxID:   [id number]  Patient wanted one month to  local pharmacy and then a 3 month supply to [facility]

## 2010-12-11 NOTE — Assessment & Plan Note (Signed)
Summary: F UP   Vital Signs:  Patient profile:   65 year old male Height:      75 inches Weight:      196.75 pounds BMI:     24.68 O2 Sat:      98 % on Room air Pulse rate:   96 / minute Pulse rhythm:   regular Resp:     16 per minute BP sitting:   130 / 70  (left arm)  Vitals Entered By: Baldomero Lamy LPN (September 14, 5464 7:58 AM)  O2 Flow:  Room air CC: follow-up visit Is Patient Diabetic? Yes Did you bring your meter with you today? No Pain Assessment Patient in pain? no        Primary Care Faatima Tench:  Dr. Tula Nakayama  CC:  follow-up visit.  History of Present Illness: Nicotine approx 14/day, plans to quit. Reports  that he has been  doing well. Denies current  fever or chills.Hwe recently rcovered from acute bronchitis fully. Denies sinus pressure, nasal congestion , ear pain or sore throat. Denies chest congestion, or cough productive of sputum. Denies chest pain, palpitations, PND, orthopnea or leg swelling. Denies abdominal pain, nausea, vomitting, diarrhea or constipation. Denies change in bowel movements or bloody stool. Denies dysuria , frequency, incontinence or hesitancy.He c/o ED wants testosterone injections if he qualifies. Denies  joint pain, swelling, or reduced mobility. Denies headaches, vertigo, seizures. Denies depression, anxiety or insomnia. Denies  rash, lesions, or itch.     Preventive Screening-Counseling & Management  Alcohol-Tobacco     Smoking Cessation Counseling: yes  Current Medications (verified): 1)  Lovastatin 40 Mg  Tabs (Lovastatin) .... Take 1 Tab By Mouth At Bedtime 2)  Metformin Hcl 1000 Mg  Tabs (Metformin Hcl) .... Take 1 Tablet By Mouth Two Times A Day 3)  Lotensin 40 Mg Tabs (Benazepril Hcl) .... Take 1 Tablet By Mouth Once A Day 4)  Amlodipine Besylate 2.5 Mg Tabs (Amlodipine Besylate) .... Take 1 Tablet By Mouth Once A Day  Allergies (verified): No Known Drug Allergies  Review of Systems      See  HPI General:  Complains of fatigue. Eyes:  Denies discharge, eye pain, and red eye. GU:  Complains of decreased libido. Endo:  Denies cold intolerance, excessive thirst, excessive urination, and heat intolerance. Heme:  Denies abnormal bruising and bleeding. Allergy:  Denies hives or rash and itching eyes.  Physical Exam  General:  Well-developed,well-nourished,in no acute distress; alert,appropriate and cooperative throughout examination HEENT: No facial asymmetry,  EOMI, No sinus tenderness, TM's Clear, oropharynx  pink and moist.   Chest: Clear to auscultation bilaterally.  CVS: S1, S2, No murmurs, No S3.   Abd: Soft, Nontender.  MS: Adequate ROM spine, hips, shoulders and knees.  Ext: No edema.   CNS: CN 2-12 intact, power tone and sensation normal throughout.   Skin: Intact, no visible lesions or rashes.  Psych: Good eye contact, normal affect.  Memory intact, not anxious or depressed appearing.    Impression & Recommendations:  Problem # 1:  DERMATITIS (ICD-692.9) Assessment Improved  Problem # 2:  HYPERTENSION (ICD-401.9) Assessment: Improved  His updated medication list for this problem includes:    Lotensin 40 Mg Tabs (Benazepril hcl) .[name]... Take 1 tablet by mouth once a day    Amlodipine Besylate 2.5 Mg Tabs (Amlodipine besylate) .[name]... Take 1 tablet by mouth once a day  Orders: T-Basic Metabolic Panel ([id number])  BP today: 130/70 Prior BP: 150/90 (08/27/2010)  Prior  10 Yr Risk Heart Disease: 47 % (11/07/2009)  Labs Reviewed: K+: 4.6 (05/31/2010) Creat: : 1.01 (05/31/2010)   Chol: 151 (05/31/2010)   HDL: 30 (05/31/2010)   LDL: 94 (05/31/2010)   TG: 137 (05/31/2010)  Problem # 3:  HYPERLIPIDEMIA (ICD-272.4) Assessment: Unchanged  His updated medication list for this problem includes:    Lovastatin 40 Mg Tabs (Lovastatin) .[name]... Take 1 tab by mouth at bedtime  Orders: T-Hepatic Function [phone]) T-Lipid Profile [phone])  Labs  Reviewed: SGOT: 13 (10/24/2009)   SGPT: 10 (10/24/2009)  Prior 10 Yr Risk Heart Disease: 47 % (11/07/2009)   HDL:30 (05/31/2010), 34 (10/24/2009)  LDL:94 (05/31/2010), 106 (10/24/2009)  Chol:151 (05/31/2010), 160 (10/24/2009)  Trig:137 (05/31/2010), 102 (10/24/2009)  Problem # 4:  TOBACCO ABUSE (ICD-305.1) Assessment: Unchanged  Encouraged smoking cessation and discussed different methods for smoking cessation.   Problem # 5:  ERECTILE DYSFUNCTION (ICD-302.72) Assessment: Deteriorated  Orders: T-Testosterone; Total [phone])  Problem # 6:  DIABETES MELLITUS-NO INSULIN (ICD-250.00) Assessment: Deteriorated  His updated medication list for this problem includes:    Metformin Hcl 1000 Mg Tabs (Metformin hcl) .[name]... Take 1 tablet by mouth two times a day    Lotensin 40 Mg Tabs (Benazepril hcl) .[name]... Take 1 tablet by mouth once a day    Glipizide 5 Mg Xr24h-tab (Glipizide) .[name]... Take 1 tablet by mouth once a day  Orders: T- Hemoglobin A1C ([id number]) Patient advised to reduce carbs and sweets, commit to regular physical activity, take meds as prescribed, test blood sugars as directed, and attempt to lose weight , to improve blood sugar control.  Labs Reviewed: Creat: 1.01 (05/31/2010)     Last Eye Exam: normal (10/24/2009) Reviewed HgBA1c results: 7.0 (05/31/2010)  6.7 (01/25/2010)  Complete Medication List: 1)  Lovastatin 40 Mg Tabs (Lovastatin) .... Take 1 tab by mouth at bedtime 2)  Metformin Hcl 1000 Mg Tabs (Metformin hcl) .... Take 1 tablet by mouth two times a day 3)  Lotensin 40 Mg Tabs (Benazepril hcl) .... Take 1 tablet by mouth once a day 4)  Amlodipine Besylate 2.5 Mg Tabs (Amlodipine besylate) .... Take 1 tablet by mouth once a day 5)  Glipizide 5 Mg Xr24h-tab (Glipizide) .... Take 1 tablet by mouth once a day  Patient Instructions: 1)  Please schedule a follow-up appointment in 3.5 months. 2)  BMP prior to visit, ICD-9: 3)  Hepatic Panel prior to visit,  ICD-9: 4)  Lipid Panel prior to visit, ICD-9:  fasting in 3.5 months 5)  HbgA1C prior to visit, ICD-9: 6)  Tobacco is very bad for your health and your loved ones! You Should stop smoking!. 7)  Stop Smoking Tips: Choose a Quit date. Cut down before the Quit date. decide what you will do as a substitute when you feel the urge to smoke(gum,toothpick,exercise). Prescriptions: GLIPIZIDE 5 MG XR24H-TAB (GLIPIZIDE) Take 1 tablet by mouth once a day  #90 x 1   Entered and Authorized by:   Tula Nakayama MD   Signed by:   Tula Nakayama MD on 09/17/2010   Method used:   Historical   RxID:   [id number]    Orders Added: 1)  Est. Patient Level IV [29037] 2)  T-Testosterone; Total [[id number]] 3)  T-Basic Metabolic Panel [[id number]] 4)  T-Hepatic Function [80076-22960] 5)  T-Lipid Profile [80061-22930] 6)  T- Hemoglobin A1C [83036-23375]

## 2010-12-11 NOTE — Assessment & Plan Note (Signed)
Summary: bp check  Nurse Visit   Vital Signs:  Patient profile:   65 year old male Height:      75 inches Weight:      200.25 pounds O2 Sat:      97 % Pulse rate:   70 / minute Resp:     16 per minute BP sitting:   120 / 74 Cuff size:   regular  Vitals Entered By: Kate Sable (November 28, 2009 4:07 PM) CC: BP check   Allergies: No Known Drug Allergies  Orders Added: 1)  No Charge Patient Arrived (NCPA0) [NCPA0]

## 2010-12-14 NOTE — Progress Notes (Signed)
Summary: DERMATOLOGY  DERMATOLOGY   Imported By: Dierdre Harness 07/02/2010 13:55:16  _____________________________________________________________________  External Attachment:    Type:   Image     Comment:   External Document

## 2010-12-28 ENCOUNTER — Encounter (INDEPENDENT_AMBULATORY_CARE_PROVIDER_SITE_OTHER): Payer: Self-pay

## 2011-01-02 NOTE — Letter (Signed)
Summary: Recall Colonoscopy/Endoscopy, Change to Office Visit  [facility]  [address]   [place], [place] [address]   Phone: [phone]  Fax: [phone]      December 28, 2010   MARSHUN DUVA [address] [place], [place]  [address] 02/27/1946   Dear Mr. Garman,   According to our records, it is time for you to schedule a Colonoscopy/Endoscopy. However, after reviewing your medical record, we recommend an office visit in order to determine your need for a repeat procedure.  Please call [phone] at your convenience to schedule an office visit. If you have any questions or concerns, please feel free to contact our office.   Sincerely,   Waldon Merl LPN  [facility] Ph: [phone]   Fax: [phone]

## 2011-01-25 ENCOUNTER — Other Ambulatory Visit: Payer: Self-pay | Admitting: Family Medicine

## 2011-01-25 ENCOUNTER — Encounter: Payer: Self-pay | Admitting: Family Medicine

## 2011-01-25 ENCOUNTER — Ambulatory Visit (INDEPENDENT_AMBULATORY_CARE_PROVIDER_SITE_OTHER): Payer: Medicare Other | Admitting: Family Medicine

## 2011-01-25 DIAGNOSIS — E119 Type 2 diabetes mellitus without complications: Secondary | ICD-10-CM

## 2011-01-25 DIAGNOSIS — F17200 Nicotine dependence, unspecified, uncomplicated: Secondary | ICD-10-CM

## 2011-01-25 DIAGNOSIS — I10 Essential (primary) hypertension: Secondary | ICD-10-CM

## 2011-01-25 DIAGNOSIS — N200 Calculus of kidney: Secondary | ICD-10-CM | POA: Insufficient documentation

## 2011-01-25 DIAGNOSIS — N529 Male erectile dysfunction, unspecified: Secondary | ICD-10-CM

## 2011-01-25 DIAGNOSIS — E785 Hyperlipidemia, unspecified: Secondary | ICD-10-CM

## 2011-01-25 DIAGNOSIS — N289 Disorder of kidney and ureter, unspecified: Secondary | ICD-10-CM

## 2011-01-29 ENCOUNTER — Encounter: Payer: Self-pay | Admitting: Family Medicine

## 2011-01-29 ENCOUNTER — Ambulatory Visit (HOSPITAL_COMMUNITY): Admission: RE | Admit: 2011-01-29 | Payer: Medicare Other | Source: Ambulatory Visit

## 2011-01-29 LAB — CONVERTED CEMR LAB
ALT: 10 units/L (ref 0–53)
AST: 15 units/L (ref 0–37)
Albumin: 4.6 g/dL (ref 3.5–5.2)
Chloride: 98 meq/L (ref 96–112)
Glucose, Bld: 155 mg/dL — ABNORMAL HIGH (ref 70–99)
HDL: 32 mg/dL — ABNORMAL LOW (ref >39)
Hgb A1c MFr Bld: 7.7 % — ABNORMAL HIGH (ref <5.7)
LDL Cholesterol: 114 mg/dL — ABNORMAL HIGH (ref 0–99)
Sodium: 136 meq/L (ref 135–145)
Total Protein: 7.8 g/dL (ref 6.0–8.3)
Triglycerides: 110 mg/dL (ref <150)

## 2011-02-04 ENCOUNTER — Ambulatory Visit (HOSPITAL_COMMUNITY)
Admission: RE | Admit: 2011-02-04 | Discharge: 2011-02-04 | Disposition: A | Discharge status: Home / Self Care | Payer: Medicare Other | Source: Ambulatory Visit | Attending: Family Medicine | Admitting: Family Medicine

## 2011-02-04 DIAGNOSIS — I10 Essential (primary) hypertension: Secondary | ICD-10-CM | POA: Insufficient documentation

## 2011-02-04 DIAGNOSIS — E119 Type 2 diabetes mellitus without complications: Secondary | ICD-10-CM | POA: Insufficient documentation

## 2011-02-04 DIAGNOSIS — N289 Disorder of kidney and ureter, unspecified: Secondary | ICD-10-CM

## 2011-02-07 NOTE — Assessment & Plan Note (Signed)
Summary: f up   Vital Signs:  Patient profile:   65 year old male Height:      75 inches Weight:      194.50 pounds BMI:     24.40 O2 Sat:      98 % on Room air Pulse rate:   87 / minute Pulse rhythm:   regular Resp:     16 per minute BP sitting:   130 / 80  (left arm)  Vitals Entered By: Baldomero Lamy LPN (January 24, 5637 9:37 AM)  O2 Flow:  Room air CC: follow-up visit Is Patient Diabetic? Yes Comments did not bring meds but brought a hand written med list   Primary Care Provider:  Dr. Tula Nakayama  CC:  follow-up visit.  History of Present Illness: Reports  that  he has been doing well. He still does not test his blood sugars, andhas started a rigouros exercise program, so he anticipates improved blood sugars  Denies recent fever or chills. Denies sinus pressure, nasal congestion , ear pain or sore throat. Denies chest congestion, or cough productive of sputum. Denies chest pain, palpitations, PND, orthopnea or leg swelling. Denies abdominal pain, nausea, vomitting, diarrhea or constipation. Denies change in bowel movements or bloody stool. Denies dysuria , frequency, incontinence or hesitancy. Denies  joint pain, swelling, or reduced mobility. Denies headaches, vertigo, seizures. Denies depression, anxiety or insomnia.      Preventive Screening-Counseling & Management  Alcohol-Tobacco     Smoking Cessation Counseling: yes  Current Medications (verified): 1)  Lovastatin 40 Mg  Tabs (Lovastatin) .... Take 1 Tab By Mouth At Bedtime 2)  Metformin Hcl 1000 Mg  Tabs (Metformin Hcl) .... Take 1 Tablet By Mouth Two Times A Day 3)  Amlodipine Besylate 2.5 Mg Tabs (Amlodipine Besylate) .... Take 1 Tablet By Mouth Once A Day 4)  Glipizide 2.5 Mg Xr24h-Tab (Glipizide) .... One Tab By Mouth Once Daily 5)  Chlorthalidone 25 Mg Tabs (Chlorthalidone) .... One Tab By Mouth Once Daily  Allergies (verified): No Known Drug Allergies  Review of Systems      See HPI Eyes:   Denies discharge and red eye. Derm:  Complains of lesion(s) and rash; improved on lower ext. Endo:  Denies cold intolerance, excessive hunger, excessive thirst, and excessive urination. Heme:  Denies abnormal bruising and bleeding. Allergy:  Complains of seasonal allergies.  Physical Exam  General:  Well-developed,well-nourished,in no acute distress; alert,appropriate and cooperative throughout examination HEENT: No facial asymmetry,  EOMI, No sinus tenderness, TM's Clear, oropharynx  pink and moist.   Chest: Clear to auscultation bilaterally.  CVS: S1, S2, No murmurs, No S3.   Abd: Soft, Nontender.  MS: Adequate ROM spine, hips, shoulders and knees.  Ext: No edema.   CNS: CN 2-12 intact, power tone and sensation normal throughout.   Skin: Intact, no visible lesions or rashes.  Psych: Good eye contact, normal affect.  Memory intact, not anxious or depressed appearing.    Impression & Recommendations:  Problem # 1:  DERMATITIS (ICD-692.9) Assessment Improved  Problem # 2:  HYPERLIPIDEMIA (DSK-876.4) Assessment: Comment Only  The following medications were removed from the medication list:    Lovastatin 40 Mg Tabs (Lovastatin) .[name]... Take 1 tab by mouth at bedtime His updated medication list for this problem includes:    Lovastatin 40 Mg Tabs (Lovastatin) .[name]..[name] Two tablets at bedtime , dose increase effective 01/28/2011 Low fat dietdiscussed and encouraged  Orders: T-Lipid Profile [phone]) T-Hepatic Function [phone]) T-Lipid Profile [phone]) T-Hepatic  Function [phone]) Medicare Electronic Prescription [phone])  Labs Reviewed: SGOT: 13 (10/24/2009)   SGPT: 10 (10/24/2009)  Prior 10 Yr Risk Heart Disease: 47 % (11/07/2009)   HDL:30 (05/31/2010), 34 (10/24/2009)  LDL:94 (05/31/2010), 106 (10/24/2009)  Chol:151 (05/31/2010), 160 (10/24/2009)  Trig:137 (05/31/2010), 102 (10/24/2009)  Problem # 3:  TOBACCO ABUSE (ICD-305.1) Assessment:  Unchanged  Encouraged smoking cessation and discussed different methods for smoking cessation.   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 Take 1 tablet by mouth once a day    Glipizide 5 Mg Xr24h-tab (Glipizide) .[name]... Take 1 tablet by mouth once a day    Glipizide 2.5 Mg Xr24h-tab (Glipizide) ..... One tab by mouth once daily His updated medication list for this problem includes:    Metformin Hcl 1000 Mg Tabs (Metformin hcl) .[name]... Take 1 tablet by mouth two times a day    Glipizide 5 Mg Xr24h-tab (Glipizide) .[name]... Take 1 tablet by mouth once a day dose increase effective 01/28/2011 Patient advised to reduce carbs and sweets, commit to regular physical activity, take meds as prescribed, test blood sugars as directed, and attempt to lose weight , to improve blood sugar control.  Orders: T- Hemoglobin A1C [phone]) T- Hemoglobin A1C ([id number]) Medicare Electronic Prescription (G8553)Future Orders: Radiology Referral (Radiology) ... 01/29/2011  Labs Reviewed: Creat: 0.94 (09/14/2010)     Last Eye Exam: normal (10/24/2009) Reviewed HgBA1c results: 7.7 (09/14/2010)  7.0 (05/31/2010)  Complete Medication List: 1)  Metformin Hcl 1000 Mg Tabs (Metformin hcl) .... Take 1 tablet by mouth two times a day 2)  Amlodipine Besylate 2.5 Mg Tabs (Amlodipine besylate) .... Take 1 tablet by mouth once a day 3)  Chlorthalidone 25 Mg Tabs (Chlorthalidone) .... One tab by mouth once daily 4)  Fluticasone Propionate 50 Mcg/act Susp (Fluticasone propionate) .... One to two puffs per nostril once daily  as needed for allergies 5)  Glipizide 5 Mg Xr24h-tab (Glipizide) .... Take 1 tablet by mouth once a day dose increase effective 01/28/2011 6)  Lovastatin 40 Mg Tabs (Lovastatin) .... Two tablets at bedtime , dose increase effective 01/28/2011  Other  Orders: Radiology other (Radiology Other) CXR- 2view (CXR) T-Basic Metabolic Panel ([id number]) T-Basic Metabolic Panel ([id number]) T-CBC w/Diff ([id number]) T-PSA ([id number])  Patient Instructions: 1)  Please schedule a follow-up appointment Juky 27 or after 2)  renal US eval med renal disease, and also for obstr 3)  Call and schedule colonoscopy, since due. 4)  Pls call and schedule  your eye appointemnt. 5)  Congrats on starting your regular exercise   program, [place] in the [place]ics! 6)    Renal ultrasound eval medical renal disease. 7)    8)  BMP prior to visit, ICD-9: 9)  Hepatic Panel prior to visit, ICD-9: 10)  Lipid Panel prior to visit, ICD-9:, HBA1C   fasting today 11)  CBC, chem7, lipid, hepatic and HBA1C, PSA   Juy 23 or after 12)  Tobacco is very bad for your health and your loved ones! You Should stop smoking!. 13)  Stop Smoking Tips: Choose a Quit date. Cut down before the Quit date. decide what you will do as a substitute when you feel the urge to smoke(gum,toothpick,exercise). Prescriptions: LOVASTATIN 40 MG TABS (LOVASTATIN) two tablets at bedtime , dose increase effective 01/28/2011  #180 x 1   Entered and Authorized by:   Tula Nakayama MD  Signed by:   Tula Nakayama MD on 01/28/2011   Method used:   Historical   RxID:   [id number] GLIPIZIDE 5 MG XR24H-TAB (GLIPIZIDE) Take 1 tablet by mouth once a day dose increase effective 01/28/2011  #90 x 1   Entered and Authorized by:   Tula Nakayama MD   Signed by:   Tula Nakayama MD on 01/28/2011   Method used:   Historical   RxID:   [id number] FLUTICASONE PROPIONATE 50 MCG/ACT SUSP (FLUTICASONE PROPIONATE) one to two puffs per nostril once daily  as needed for allergies  #1 x 3   Entered and Authorized by:   Tula Nakayama MD   Signed by:   Tula Nakayama MD on 01/25/2011   Method used:   Electronically to        [facility] [phone]* (retail)       [address], [place]  [address]       Ph: [phone]       Fax: [phone]   RxID:   [id number]    Orders Added: 1)  Est. Patient Level IV [97948] 2)  Radiology other [Radiology Other] 3)  CXR- 2view [CXR] 4)  T-Basic Metabolic Panel [[id number]] 5)  T-Lipid Profile [80061-22930] 6)  T-Hepatic Function [80076-22960] 7)  T- Hemoglobin A1C [83036-23375] 8)  T-Basic Metabolic Panel [[id number]] 9)  T-CBC w/Diff [[id number]] 10)  T-Lipid Profile [80061-22930] 11)  T-Hepatic Function [80076-22960] 12)  T- Hemoglobin A1C [83036-23375] 13)  T-PSA [75883-25498] 14)  Radiology Referral [Radiology] 15)  Medicare Electronic Prescription [phone]

## 2011-03-26 NOTE — Procedures (Signed)
[facility]                              EXERCISE TREADMILL   MUSSA, GROESBECK                     MRN:          [id number]  DATE:11/07/2009                            DOB:          15-Dec-1945    CLINICAL DATA:  A 65 year old gentleman with multiple cardiovascular  risk factors.  1. Treadmill exercise performed to a workload of 8.5 METS and a heart      rate of 155, 99% of age predicted maximum.  Exercise discontinued      due to dyspnea and fatigue; no chest pain described.  2. Blood pressure increased from a resting value of 160/75 to 200/80      early in recovery, a mildly hypertensive response from a      hypertensive baseline.  3. No significant arrhythmias noted.  4. EKG:  Normal sinus rhythm; left atrial abnormality; right      ventricular conduction delay; prominent voltage; intraatrial      conduction delay; borderline first-degree AV block.   Stress EKG:  Upsloping ST-segment depression early in exercise;  subsequently became virtually flat at peak exercise reaching a maximum  value of 1.5 - 2.5 mm in the inferior leads as well as 2 - 3 mm in leads  V4-V6.  Flat ST-segment depression persisted early in recovery, but  subsequently became downsloping with persistent depression of  approximately 1 mm.   IMPRESSION:  Abnormal graded exercise test revealing somewhat impaired  exercise capacity, no symptoms to suggest myocardial ischemia, but  potentially ischemic EKG changes as described.  Due to these  abnormalities, a stress echocardiogram will be scheduled.  As a result  of his resting and exercise-induced hypertension, chlorthalidone 12.5 mg  will be added to his medical regime.     Cristopher Estimable. Lattie Haw, MD, [facility]  Electronically Signed    RMR/MedQ  DD: 11/07/2009  DT: 11/08/2009  Job #: [id number]
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
## 2011-03-26 NOTE — H&P (Signed)
[name], STRENGTH NO.:  [id number]   MEDICAL RECORD NO.:  [id number]          PATIENT TYPE:  INP   LOCATION:  A224                          FACILITY:  [facility]   PHYSICIAN:  [name], DO    DATE OF BIRTH:  [birth year]   DATE OF ADMISSION:  11/06/2007  DATE OF DISCHARGE:  LH                              HISTORY & PHYSICAL   The patient is a 65 year old African American male who presented to the  [facility] emergency room with a 1-day history of nausea and  vomiting.  The patient states on [name] he had no complaints.  He  woke up on Christmas day with a little bit of nausea and vomiting which  progressed through the night and became worse.  He also admits not  having a primary care physician.  He has not been seen by a doctor in  years.  Also, during his bouts of emesis he noticed that it was a dark  brown in color and was very gritty in nature.  At that point in time he  was brought to the emergency room.   PAST MEDICAL HISTORY:  He has been told that he has had stomach  problems in the past and took a pill for a year but does not remember  what it was called.   SURGICAL HISTORY:  1. Back surgery, where they ablated a nerve.  2. Hand surgery.  3. Rotator cuff surgery.   SOCIAL HISTORY:  Currently he is married with 2 children.  He is a  nondrinker.  No drug abuse.  Smokes at least 1 pack a day x40 year.   ALLERGIES:  He denies any known allergies to any drugs.   He currently is not taking any medications.   REVIEW OF SYSTEMS:  Negative for fever, chills.  Positive for weakness.  ENT:  Negative for ear pain, epistaxis, or hoarseness.  CARDIOVASCULAR:  Negative for chest pain or dyspnea.  RESPIRATORY:  Negative for cough or  wheezing.  GASTROINTESTINAL:  Positive for nausea, vomiting, abdominal  pain, hematemesis.  Negative for diarrhea or blood in stool.  GU:  Negative for dysuria, flank pain.  MUSCULOSKELETAL:  Negative for  arthralgias or back pain.  SKIN:  Negative for pruritus or rash.  NEUROLOGIC:  Negative for headache or confusion.  METABOLIC:  Noncontributory.  HEMATOLOGIC:  Noncontributory.  ALLERGIC:  Negative  for rash or pruritus.   PHYSICAL EXAMINATION:  VITAL SIGNS:  Currently, temperature 98.4, pulse  80, respirations 20, and blood pressure is 167/83.  GENERAL:  This is a 65 year old African American male who is well  developed, well nourished in no acute distress.  SKIN:  Good turgor.  Good texture.  No bruises or tattoos noted.  HEENT:  Head is atraumatic, normocephalic.  Pupils are normal pupil size  and shape.  No conjunctival injection or scleral icterus.  Ears:  TMs  visualized bilaterally, symmetrical.  Gross auditory acuity intact.  Nose:  Symmetrical.  No tenderness and no turbinate inflammation.  Throat and mouth:  Fair dental hygiene with some partials noted, no  erythema or exudate.  NECK:  No masses.  Full range of motion.  No tracheal deviation.  The  thyroid is within normal limits.  LUNGS:  The chest is symmetric with respirations.  Decreased breath  sounds throughout lung fields.  No wheezes or crackles noted.  HEART:  Regular rate and rhythm.  No rubs, gallops, or clicks.  ABDOMEN:  Soft, nontender, nondistended.  No masses or guarding noted.  MUSCULOSKELETAL:  No muscular atrophy noted.  No weakness.  Full range  of motion of bilateral extremities.  No edema, cyanosis, or ecchymosis  noted.  Positive dorsalis pedis bilaterally.  NEUROLOGIC:  Cranial nerves II-XII are grossly intact.   LABORATORY:  He was typed and screened, he is A positive.  Rh negative.  Lipase 14, amylase 24.  His comprehensive metabolic panel everything was  normal except for his glucose at 259.  His BUN creatinine were all  within normal limits.  His white count was 13.1, hemoglobin 17.4,  hematocrit 52.2, and platelet count of 255.   ASSESSMENT/PLAN:  1. Hematemesis.  2. Possible gastrointestinal  bleed.  3. New diagnosis of hypertension.  4. Hyperglycemia.   PLAN:  1. We will admit the patient to the service of [facility] and initial      admitting orders were given by Dr. Rolena Infante.  2. We will keep the patient NPO.  3. We will also consult GI.  4. We will give him IV fluids until he can be seen by GI to determine      what the next course of action is.  5. Also we will place the patient on DVT and GI prophylaxis.  6. We will do serial H&H.  7. We will type and cross him as well.  8. We will also start him on low dose hypertensive medication.  We      have had currently 3 readings that show him to be stage II      hypertensive.  9. Also, we will get a hemoglobin A1c in the morning as well as a B-      MET in the morning to see his blood sugars, where his level has      been for the last 3 months.      [name], DO  Electronically Signed     CB/MEDQ  D:  11/06/2007  T:  11/06/2007  Job:  [id number]

## 2011-03-26 NOTE — H&P (Signed)
NAME:  [name], [name]              ACCOUNT NO.:  [id number]   MEDICAL RECORD NO.:  [id number]          PATIENT TYPE:  AMB   LOCATION:  DAY                           FACILITY:  [facility]   PHYSICIAN:  R. Garfield Cornea, M.D. DATE OF BIRTH:  Mar 26, 1946   DATE OF ADMISSION:  DATE OF DISCHARGE:  LH                              HISTORY & PHYSICAL   CHIEF COMPLAINT:  Need a screening colonoscopy.   [name] is a very pleasant 65 year old African-American male  admitted to hospital over the Christmas holidays with acute onset  nausea, vomiting, upper abdominal pain which resolved.  He was felt to  have food-borne illness.  He had Gastroccult positive emesis but  clinically no significant GI bleeding.  His hemoglobin remained well in  the normal range.  He has never had his lower GI tract evaluated.  There  is no family history of colorectal neoplasia.  In addition he was noted  to have hematuria and was diagnosed with type 2 diabetes mellitus.  He  is in the process of seeing Dr. Michela Pitcher and Dr.  Maryland Pink for hematuria  and is being followed by Dr. Moshe Cipro regarding his type 2 diabetes  mellitus.  He is not having any odynophagia, dysphagia, early satiety,  reflux symptoms or abdominal pain.  He is on Protonix 40 grams orally  daily empirically for the time being since hospitalization.   PAST MEDICAL HISTORY:  Distant history of gastroesophageal reflux  disease symptoms, history of hand surgery, back surgery, rotator cuff  surgery.  He denies any GI surgery, possibly undergoing an EGD some 20  years ago.   CURRENT MEDICATIONS:  Metformin 500 grams daily.  Pantoprazole 40 mg  daily.   ALLERGIES:  NO KNOWN DRUG ALLERGIES   FAMILY HISTORY:  Mom and dad lived to their 30s and 69s respectively.  No history chronic GI or liver illness.   SOCIAL HISTORY:  The patient is married, lives in [place], [place], has three children.  He is retired from [employer], smokes one  pack  cigarettes per day.  No illicit drugs or alcohol.   REVIEW OF SYSTEMS:  No chest pain, no dyspnea on exertion.  No weight  loss.  No fever, chills.   PHYSICAL EXAMINATION:  Pleasant 65 year old gentleman resting  comfortably.  Weight 205.5, height 6 feet 3 __________ 79, BP 120/80,  pulse 88.  SKIN:  Warm and dry.  There is no scleral icterus.  CHEST:  Lungs are clear to auscultation.  CARDIOVASCULAR:  Regular rate and rhythm without murmur, gallop, rub.  ABDOMEN:  Nondistended.  Positive bowel sounds, soft, nontender without  appreciable mass or organomegaly.  RECTAL:  Exam deferred until time of colonoscopy.   IMPRESSION:  [name] is a very pleasant 52 year old gentleman  whom I met on November 07, 2007 while hospitalized with what I feel was  a self-limiting food-borne illness.  If he had an upper GI bleed, it was  trivial in nature.   The pertinent issue now is the need for screening colonoscopy.  He is  somewhat overdue.  He has  hematuria which is being evaluated by the  urologist.   RECOMMENDATIONS:  Will set him up for screening colonoscopy in a couple  of weeks.  I have reviewed potential risks, benefits, alternatives and  limitations with [name] and his questions were answered.  He is  agreeable.  I will make further recommendations once screening  colonoscopy has been performed.      Bridgette Habermann, M.D.  Electronically Signed     RMR/MEDQ  D:  11/19/2007  T:  11/19/2007  Job:  [id number]   cc:   Norwood Levo. Moshe Cipro, M.D.  Fax: [phone]

## 2011-03-26 NOTE — Discharge Summary (Signed)
[name], [name]              ACCOUNT NO.:  [id number]   MEDICAL RECORD NO.:  [id number]          PATIENT TYPE:  INP   LOCATION:  A224                          FACILITY:  [facility]   PHYSICIAN:  Merry Lofty, MD   DATE OF BIRTH:  03/28/1946   DATE OF ADMISSION:  11/06/2007  DATE OF DISCHARGE:  LH                               DISCHARGE SUMMARY   PRIMARY MEDICAL DOCTOR:  He has no PMD.   GASTROENTEROLOGIST:  Dr. Gala Romney.   DISCHARGE DIAGNOSIS:  1. Nausea, vomiting with coffee ground, resolved.  Hemoglobin is      stable, and assessed by gastroenterologist, Dr. Gala Romney, and has      followup for possible colonoscopy in 1 week.  2. Mild hematuria with urinalysis, large blood, and he will have a CAT      scan of the abdomen, and have followup with one of the urologists      in our area, and the names and their address will be given to      patient.  3. Slightly elevated blood glucose level.  He needs followup with      chemistry.  4. Diabetes mellitus, newly diagnosed.   HOME MEDICATIONS:  He will be discharged with:  1. Protonix 40 mg p.o. daily.  2. Metformin 500 mg p.o. daily.   HOSPITAL COURSE:  [name] is a 65 year old male patient without  significant past medical history, who presents to emergency room with  persistent nausea and vomiting, and his vomitus was __________  to  include dark coffee ground, and was admitted with possibility of upper  GI bleed, and he was put on IV fluids and Protonix, and he was evaluated  by GI, and the patient remained stable and his vomiting stopped, and his  hemoglobin remains very stable in the normal range.  The only thing is  he had urine with large blood and even though there is no significant  frank bleeding, and the patient is very stable and he is ready to go  home, and he will have followup with GI in 1 week with Dr. Gala Romney, and  will have a urologist followup also after having CAT scan tomorrow, and  he needs followup also for  his high blood sugar.  If this persists, he  might need treatment, and I advised to have PMD and his locale  in 1-2  weeks.   VITAL SIGNS:  Temperature 97.9, pulse is 85, respiratory rate 20, blood  pressure is 136/88.  HEENT:  Pink conjunctivae, nonicteric sclera.  NECK:  Supple.  CHEST:  He has good air entry bilaterally.  CVS:  S1, S2 well-heard.  ABDOMEN:  Soft.  EXTREMITIES:  No pedal edema.  CNS:  Alert and well-oriented.   His labs today:  White blood cells 8.3, hemoglobin is 14.8, hematocrit  13 and platelet count is 212.  Chemistries within normal range.  Urinalysis showed yesterday blood large, protein 30, and urine glucose  is 500, and hemoglobin A1c is 9.7.  Chemistry:  sodium 138, potassium  3.9 and chloride 105, bicarb is 25, glucose is 181, and  BUN is 18,  creatinine 0.9.   DISCHARGE PLAN:  He will be discharged today, to have followup with GI  in 1 week, and with PMD in 1 week, and with urologist also after he has  CAT scan of the abdomen in the coming 1 or 2 weeks to be evaluated for  his cause of hematuria, and this was discussed with the patient in  detail, and he agreed.      Merry Lofty, MD  Electronically Signed     MT/MEDQ  D:  11/08/2007  T:  11/09/2007  Job:  [id number]

## 2011-03-26 NOTE — H&P (Signed)
[name], [name]              ACCOUNT NO.:  [id number]   MEDICAL RECORD NO.:  [id number]          PATIENT TYPE:  AMB   LOCATION:  DAY                           FACILITY:  [facility]   PHYSICIAN:  Miguel Dibble, M.D.   DATE OF BIRTH:  12/14/1945   DATE OF ADMISSION:  01/20/2008  DATE OF DISCHARGE:  LH                              HISTORY & PHYSICAL   CHIEF COMPLAINT:  Left renal calculus, microhematuria.   HISTORY OF PRESENT ILLNESS:  This 65 year old male was referred to me by  Dr. Gala Romney.  He was hospitalized in November 2008 with nausea, vomiting,  and lower abdominal pain associated with microhematuria.  CT scan of  abdomen and pelvis without and with contrast revealed a 7 mm size stone  in the lower pole of the left kidney.  There was no evidence of any  obstruction.  There were no ureteral calculi, hydronephrosis, or renal  calculi.   The patient has a history of urolithiasis in the past.  He has passed a  stone about 4 or 5 years ago.  He denied having any flank pain or gross  hematuria at present.  He is voiding well.  He has urinary frequency x3-  4 and nocturia x0.   PAST MEDICAL HISTORY:  1. History of diabetes mellitus.  2. Gastroesophageal reflux disease.  3. Impotence.   MEDICATIONS:  1. Metformin 1 p.o. daily, dose unknown.  2. Nexium 40 mg p.o. daily.   FAMILY HISTORY:  Positive for diabetes mellitus.  There is no family  history of cancer, heart disease, or hypertension.   PHYSICAL EXAMINATION:  VITAL SIGNS:  Height 6 feet 3 inches, weight 205  pounds.  HEENT:  Normal.  NECK:  No masses.  LUNGS:  Clear to auscultation.  HEART:  Regular rate and rhythm.  No murmurs.  ABDOMEN:  Soft.  No palpable flank mass or CVA tenderness.  Bladder is  not palpable.  PELVIC:  Penis and testes are normal.  RECTAL:  40 g benign prostate.   X-ray of the KUB area confirmed a 7 mm right lower pole left renal  calculus.   IMPRESSION:  1. Left renal calculus.  2.  Microhematuria.   PLAN:  Extracorporeal shock wave lithotripsy of the large left renal  calculus with IV sedation in short stay center.  I have discussed with  the patient regarding diagnosis, operative details, alternative  treatments, outcome, possible risks and complications.  He has agreed  for the procedure to be done.      Miguel Dibble, M.D.  Electronically Signed     SK/MEDQ  D:  01/20/2008  T:  01/20/2008  Job:  [id number]   cc:   R. Garfield Cornea, M.D.  [address]  [place]  [place] [address]   Margaret E. Moshe Cipro, M.D.  Fax: [phone]

## 2011-03-29 NOTE — Op Note (Signed)
NAME:  [name], [name]              ACCOUNT NO.:  [id number]   MEDICAL RECORD NO.:  [id number]          PATIENT TYPE:  AMB   LOCATION:  DAY                           FACILITY:  [facility]   PHYSICIAN:  R. Garfield Cornea, M.D. DATE OF BIRTH:  03-22-1946   DATE OF PROCEDURE:  DATE OF DISCHARGE:  11/25/2007                               OPERATIVE REPORT   PROCEDURE:  Screening colonoscopy.   INDICATIONS:  The patient is a 65 year old gentleman who comes for  screening colonoscopy.  He has no lower GI tract symptoms.  There is no  family history of GI neoplasia.  He has never had his lower GI tract  evaluated.  Colonoscopy is now being done as a screening maneuver.  This  approach has been discussed with the patient.  The potential risks,  benefits and alternatives have been reviewed, questions answered and he  is agreeable.  Please see documentation in the medical record.   DESCRIPTION OF PROCEDURE:  Oxygen saturation, blood pressure, pulse and  respiration were monitored throughout the entire procedure.  Conscious  sedation with Versed 3 mg IV and Demerol 75 mg IV in divided doses.  The  instrument was the Pentax video chip system.   FINDINGS:  Digital rectal exam revealed no abnormalities.   ENDOSCOPIC FINDINGS:  Prep was adequate.  Colon:  Colonic mucosa was surveyed from the rectosigmoid junction  through the left, transverse, right colon to the area of the appendiceal  orifice, ileocecal valve and cecum.  These structures were photographed  for the record.  From this level, the scope was  withdrawn.  All  previously mentioned mucosal surfaces were again.  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  There was also some left-sided diverticula.  There were  two diminutive polyps in the sigmoid segment, both of which were cold  biopsied/removed.  There was one polyp at the splenic flexure which was  also cold biopsied and removed.   The remaining colonic mucosa appeared  normal.  The scope was pulled down into the rectum for thorough  examination of the rectal mucosa including retroflexion to view the anal  verge demonstrated no abnormalities  .  The patient tolerated the  procedure well and was reactive after endoscopy.   IMPRESSION:  1. Normal rectum and splenic flexure and sigmoid polyps, which were      cold biopsied/removal.  2. Diverticula.  3. 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  The      remainder of the colonic mucosa remained normal.   RECOMMENDATIONS:  1. Diverticulosis literature provided to Mr. Leh.  2  follow up on pathology.  1. Further recommendations to follow.      Bridgette Habermann, M.D.  Electronically Signed     RMR/MEDQ  D:  12/13/2007  T:  12/14/2007  Job:  [id number]   cc:   Norwood Levo. Moshe Cipro, M.D.  Fax: [phone]

## 2011-06-07 ENCOUNTER — Encounter: Payer: Self-pay | Admitting: Family Medicine

## 2011-06-12 ENCOUNTER — Telehealth: Payer: Self-pay | Admitting: Family Medicine

## 2011-06-12 ENCOUNTER — Ambulatory Visit (INDEPENDENT_AMBULATORY_CARE_PROVIDER_SITE_OTHER): Payer: Medicare Other | Admitting: Family Medicine

## 2011-06-12 ENCOUNTER — Encounter: Payer: Self-pay | Admitting: Family Medicine

## 2011-06-12 VITALS — BP 140/80 | HR 78 | Resp 16 | Ht 75.5 in | Wt 199.4 lb

## 2011-06-12 DIAGNOSIS — B350 Tinea barbae and tinea capitis: Secondary | ICD-10-CM

## 2011-06-12 DIAGNOSIS — E785 Hyperlipidemia, unspecified: Secondary | ICD-10-CM

## 2011-06-12 DIAGNOSIS — Z125 Encounter for screening for malignant neoplasm of prostate: Secondary | ICD-10-CM

## 2011-06-12 DIAGNOSIS — L259 Unspecified contact dermatitis, unspecified cause: Secondary | ICD-10-CM

## 2011-06-12 DIAGNOSIS — E119 Type 2 diabetes mellitus without complications: Secondary | ICD-10-CM

## 2011-06-12 DIAGNOSIS — I10 Essential (primary) hypertension: Secondary | ICD-10-CM

## 2011-06-12 DIAGNOSIS — B353 Tinea pedis: Secondary | ICD-10-CM | POA: Insufficient documentation

## 2011-06-12 DIAGNOSIS — E1129 Type 2 diabetes mellitus with other diabetic kidney complication: Secondary | ICD-10-CM | POA: Insufficient documentation

## 2011-06-12 DIAGNOSIS — R5381 Other malaise: Secondary | ICD-10-CM

## 2011-06-12 DIAGNOSIS — F17200 Nicotine dependence, unspecified, uncomplicated: Secondary | ICD-10-CM

## 2011-06-12 LAB — CBC WITH DIFFERENTIAL/PLATELET
Basophils Absolute: 0 10*3/uL (ref 0.0–0.1)
Basophils Relative: 0 % (ref 0–1)
Eosinophils Absolute: 0.1 10*3/uL (ref 0.0–0.7)
HCT: 47.2 % (ref 39.0–52.0)
MCH: 30.3 pg (ref 26.0–34.0)
MCHC: 33.7 g/dL (ref 30.0–36.0)
Monocytes Absolute: 0.4 10*3/uL (ref 0.1–1.0)
Neutro Abs: 4.1 10*3/uL (ref 1.7–7.7)
Neutrophils Relative %: 58 % (ref 43–77)
RDW: 14 % (ref 11.5–15.5)

## 2011-06-12 LAB — LIPID PANEL
Cholesterol: 225 mg/dL — ABNORMAL HIGH (ref 0–200)
HDL: 31 mg/dL — ABNORMAL LOW (ref >39)
Total CHOL/HDL Ratio: 7.3 Ratio
Triglycerides: 180 mg/dL — ABNORMAL HIGH (ref <150)

## 2011-06-12 MED ORDER — GLIPIZIDE ER 5 MG PO TB24: 5.0000 mg | ORAL_TABLET | Freq: Every day | ORAL | Status: DC

## 2011-06-12 MED ORDER — LOVASTATIN 40 MG PO TABS: ORAL_TABLET | ORAL | Status: DC

## 2011-06-12 MED ORDER — KETOCONAZOLE 2 % EX SHAM: MEDICATED_SHAMPOO | CUTANEOUS | Status: DC

## 2011-06-12 MED ORDER — AMLODIPINE BESYLATE 2.5 MG PO TABS: 2.5000 mg | ORAL_TABLET | Freq: Every day | ORAL | Status: DC

## 2011-06-12 MED ORDER — FLUTICASONE PROPIONATE 50 MCG/ACT NA SUSP: 2.0000 | Freq: Every day | NASAL | Status: DC

## 2011-06-12 MED ORDER — METFORMIN HCL 1000 MG PO TABS: 1000.0000 mg | ORAL_TABLET | Freq: Two times a day (BID) | ORAL | Status: DC

## 2011-06-12 NOTE — Assessment & Plan Note (Signed)
Improved, not yet willing to set quit date

## 2011-06-12 NOTE — Assessment & Plan Note (Signed)
Sub optimal control,no med change at this visit

## 2011-06-12 NOTE — Assessment & Plan Note (Signed)
Ketoconazole prescribed

## 2011-06-12 NOTE — Progress Notes (Signed)
  Subjective:    Patient ID: [name], male    DOB: 1946/11/05, 65 y.o.   MRN: [id number]  HPI Pt reports he feels well, he is now exercising daily for at least 45 minutes. Smokes about 10 ciggs daily, trying to quit. HYPERTENSION  Disease Monitoring  Blood pressure range-avg 130/70 Chest pain- no      Dyspnea- no  Compliance- good Lightheadedness- no   Edema- no  DIABETES  Disease Monitoring  Blood Sugar ranges-not testing Polyuria- no New Visual problems- no  Compliance- fair Hypoglycemic symptoms- no  Last eye exam: due      Podiatry visit: no  HYPERLIPIDEMIA  Compliance- good RUQ pain- no  Muscle aches- no   REGULAR EXERCISE-yes      Review of Systems See HPI Denies recent fever or chills. Denies sinus pressure, nasal congestion, ear pain or sore throat. Denies chest congestion, productive cough or wheezing. Denies chest pains, palpitations and leg swelling Denies abdominal pain, nausea, vomiting,diarrhea or constipation.   Denies dysuria, frequency, hesitancy or incontinence. Denies joint pain, swelling and limitation in mobility. Denies headaches, seizures, numbness, or tingling. Denies depression, anxiety or insomnia. C/o itching on scalp, OTC shampoos not helping      Objective:   Physical Exam Patient alert and oriented and in no cardiopulmonary distress.  HEENT: No facial asymmetry, EOMI, no sinus tenderness,  oropharynx pink and moist.  Neck supple no adenopathy.  Chest: Clear to auscultation bilaterally.  CVS: S1, S2 no murmurs, no S3.  ABD: Soft non tender. Bowel sounds normal.  Ext: No edema  MS: Adequate ROM spine, shoulders, hips and knees.  Skin: Intact, flaking on scalp, no ulcers  Psych: Good eye contact, normal affect. Memory intact not anxious or depressed appearing.  CNS: CN 2-12 intact, power, tone and sensation normal throughout.        Assessment & Plan:

## 2011-06-12 NOTE — Patient Instructions (Addendum)
CPE in  m 3.5 months.    Fasting labs  Today.  hBA1C in 3.5 months before next visit.  Pls start documenting your blood sugar every morning.  Range is 80 to 120.    Blood pressure today is elevated, goal range is 130/80 or less, pls bring your cuff to next visit.  Congrats on regular exercise and your gold medals!!!  Shampoo is sent to your pharmacy.   Please think about quitting smoking.  This is very important for your health.  Consider setting a quit date, then cutting back or switching brands to prepare to stop.  Also think of the money you will save every day by not smoking.  Quick Tips to Quit Smoking: Fix a date i.e. keep a date in mind from when you would not touch a tobacco product to smoke  Keep yourself busy and block your mind with work loads or reading books or watching movies in malls where smoking is not allowed  Vanish off the things which reminds you about smoking for example match box, or your favorite lighter, or the pipe you used for smoking, or your favorite jeans and shirt with which you used to enjoy smoking, or the club where you used to do smoking  Try to avoid certain people places and incidences where and with whom smoking is a common factor to add on  Praise yourself with some token gifts from the money you saved by stopping smoking  Anti Smoking teams are there to help you. Join their programs  Anti-smoking Gums are there in many medical shops. Try them to quit smoking   Side-effects of Smoking: Disease caused by smoking cigarettes are emphysema, bronchitis, heart failures  Premature death  Cancer is the major side effect of smoking  Heart attacks and strokes are the quick effects of smoking causing sudden death  Some smokers lives end up with limbs amputated  Breathing problem or fast breathing is another side effect of smoking  Due to more intakes of smokes, carbon mono-oxide goes into your brain and other muscles of the body which leads to  swelling of the veins and blockage to the air passage to lungs  Carbon monoxide blocks blood vessels which leads to blockage in the flow of blood to different major body organs like heart lungs and thus leads to attacks and deaths  During pregnancy smoking is very harmful and leads to premature birth of the infant, spontaneous abortions, low weight of the infant during birth  Fat depositions to narrow and blocked blood vessels causing heart attacks  In many cases cigarette smoking caused infertility in men

## 2011-06-12 NOTE — Assessment & Plan Note (Signed)
Sub optimal control, will rept lab, low fat diet discussed and encouraged

## 2011-06-12 NOTE — Assessment & Plan Note (Signed)
pruritic flaking of scalp, will treat for tinea

## 2011-06-13 LAB — COMPLETE METABOLIC PANEL WITH GFR
Alkaline Phosphatase: 50 U/L (ref 39–117)
Creat: 0.9 mg/dL (ref 0.50–1.35)
GFR, Est African American: >60 mL/min (ref >60)
GFR, Est Non African American: >60 mL/min (ref >60)
Glucose, Bld: 152 mg/dL — ABNORMAL HIGH (ref 70–99)
Sodium: 139 mEq/L (ref 135–145)
Total Bilirubin: 0.5 mg/dL (ref 0.3–1.2)
Total Protein: 7.6 g/dL (ref 6.0–8.3)

## 2011-06-13 LAB — HEMOGLOBIN A1C
Hgb A1c MFr Bld: 8.2 % — ABNORMAL HIGH (ref <5.7)
Mean Plasma Glucose: 189 mg/dL — ABNORMAL HIGH (ref <117)

## 2011-06-13 MED ORDER — KETOCONAZOLE 2 % EX SHAM: MEDICATED_SHAMPOO | CUTANEOUS | Status: AC

## 2011-06-13 NOTE — Telephone Encounter (Signed)
Sent in electronically .  

## 2011-06-27 ENCOUNTER — Telehealth: Payer: Self-pay | Admitting: Family Medicine

## 2011-06-28 ENCOUNTER — Other Ambulatory Visit: Payer: Self-pay

## 2011-06-28 MED ORDER — GLIPIZIDE ER 10 MG PO TB24: 10.0000 mg | ORAL_TABLET | Freq: Every day | ORAL | Status: DC

## 2011-06-28 MED ORDER — ROSUVASTATIN CALCIUM 20 MG PO TABS: 20.0000 mg | ORAL_TABLET | Freq: Every day | ORAL | Status: DC

## 2011-06-28 NOTE — Telephone Encounter (Signed)
Spoke with patient and he is aware

## 2011-08-05 LAB — BASIC METABOLIC PANEL
BUN: 11
CO2: 28
Calcium: 9
GFR calc non Af Amer: >60
Glucose, Bld: 150 — ABNORMAL HIGH
Potassium: 4.1

## 2011-08-16 LAB — RETICULOCYTES
RBC.: 5.32
Retic Ct Pct: 1.2

## 2011-08-16 LAB — CBC
HCT: 52.2 — ABNORMAL HIGH
Hemoglobin: 15.6
Hemoglobin: 17.4 — ABNORMAL HIGH
MCHC: 33.3
MCHC: 34
MCHC: 34.4
MCV: 86.9
Platelets: 212
Platelets: 239
Platelets: 255
RDW: 13
RDW: 13.1
RDW: 13.3

## 2011-08-16 LAB — COMPREHENSIVE METABOLIC PANEL
Albumin: 4.2
Alkaline Phosphatase: 59
BUN: 14
Calcium: 9.7
Glucose, Bld: 259 — ABNORMAL HIGH
Potassium: 3.9
Total Protein: 8.1

## 2011-08-16 LAB — BASIC METABOLIC PANEL
BUN: 14
GFR calc Af Amer: >60
GFR calc non Af Amer: >60
Potassium: 3.9

## 2011-08-16 LAB — URINE CULTURE
Colony Count: 10000
Special Requests: NEGATIVE

## 2011-08-16 LAB — HEMOGLOBIN AND HEMATOCRIT, BLOOD
HCT: 45.1
HCT: 48.8
Hemoglobin: 15.1
Hemoglobin: 16.5

## 2011-08-16 LAB — TYPE AND SCREEN
ABO/RH(D): A POS
Antibody Screen: NEGATIVE

## 2011-08-16 LAB — DIFFERENTIAL
Basophils Absolute: 0
Basophils Relative: 0
Basophils Relative: 0
Basophils Relative: 1
Eosinophils Absolute: 0.1
Eosinophils Relative: 0
Lymphocytes Relative: 23
Lymphocytes Relative: 9 — ABNORMAL LOW
Monocytes Absolute: 0.6
Monocytes Relative: 2 — ABNORMAL LOW
Monocytes Relative: 5
Neutro Abs: 11.6 — ABNORMAL HIGH
Neutro Abs: 8.7 — ABNORMAL HIGH
Neutrophils Relative %: 55
Neutrophils Relative %: 72
Neutrophils Relative %: 89 — ABNORMAL HIGH

## 2011-08-16 LAB — LIPASE, BLOOD: Lipase: 14

## 2011-08-16 LAB — URINALYSIS, ROUTINE W REFLEX MICROSCOPIC
Bilirubin Urine: NEGATIVE
Specific Gravity, Urine: >1.03 — ABNORMAL HIGH
pH: 6

## 2011-08-16 LAB — IRON AND TIBC
Iron: 99
TIBC: 304
UIBC: 205

## 2011-08-16 LAB — URINE MICROSCOPIC-ADD ON

## 2011-10-18 ENCOUNTER — Encounter: Payer: Self-pay | Admitting: Family Medicine

## 2011-10-25 ENCOUNTER — Encounter: Payer: Medicare Other | Admitting: Family Medicine

## 2012-02-14 ENCOUNTER — Telehealth: Payer: Self-pay

## 2012-02-14 DIAGNOSIS — R5383 Other fatigue: Secondary | ICD-10-CM

## 2012-02-14 DIAGNOSIS — E785 Hyperlipidemia, unspecified: Secondary | ICD-10-CM

## 2012-02-14 DIAGNOSIS — E119 Type 2 diabetes mellitus without complications: Secondary | ICD-10-CM

## 2012-02-14 DIAGNOSIS — I10 Essential (primary) hypertension: Secondary | ICD-10-CM

## 2012-02-14 NOTE — Telephone Encounter (Signed)
Labs ordered

## 2012-03-03 ENCOUNTER — Ambulatory Visit (INDEPENDENT_AMBULATORY_CARE_PROVIDER_SITE_OTHER): Payer: Medicare Other | Admitting: Family Medicine

## 2012-03-03 ENCOUNTER — Encounter: Payer: Self-pay | Admitting: Family Medicine

## 2012-03-03 VITALS — BP 142/82 | HR 83 | Resp 16 | Ht 75.0 in | Wt 193.0 lb

## 2012-03-03 DIAGNOSIS — E785 Hyperlipidemia, unspecified: Secondary | ICD-10-CM

## 2012-03-03 DIAGNOSIS — I10 Essential (primary) hypertension: Secondary | ICD-10-CM

## 2012-03-03 DIAGNOSIS — J209 Acute bronchitis, unspecified: Secondary | ICD-10-CM | POA: Diagnosis not present

## 2012-03-03 DIAGNOSIS — F17200 Nicotine dependence, unspecified, uncomplicated: Secondary | ICD-10-CM

## 2012-03-03 DIAGNOSIS — R5381 Other malaise: Secondary | ICD-10-CM

## 2012-03-03 DIAGNOSIS — E119 Type 2 diabetes mellitus without complications: Secondary | ICD-10-CM | POA: Diagnosis not present

## 2012-03-03 MED ORDER — BENZONATATE 100 MG PO CAPS: 100.0000 mg | ORAL_CAPSULE | Freq: Four times a day (QID) | ORAL | Status: DC | PRN

## 2012-03-03 MED ORDER — FLUTICASONE PROPIONATE 50 MCG/ACT NA SUSP: 2.0000 | Freq: Every day | NASAL | Status: DC

## 2012-03-03 MED ORDER — PENICILLIN V POTASSIUM 500 MG PO TABS: 500.0000 mg | ORAL_TABLET | Freq: Three times a day (TID) | ORAL | Status: AC

## 2012-03-03 MED ORDER — LOSARTAN POTASSIUM 50 MG PO TABS: 50.0000 mg | ORAL_TABLET | Freq: Every day | ORAL | Status: DC

## 2012-03-03 NOTE — Progress Notes (Signed)
  Subjective:    Patient ID: ARIQ KHAMIS, male    DOB: [birth year], 66 y.o.   MRN: [id number]  HPI The PT is here for follow up and re-evaluation of chronic medical conditions, medication management and review of any available recent lab and radiology data.  Preventive health is updated, specifically  Cancer screening and Immunization.   Questions or concerns regarding consultations or procedures which the PT has had in the interim are  addressed. The PT denies any adverse reactions to current medications since the last visit.  1 week h/o chest congestion with cough productive of yelllow sputum and inermittent chills Denies sinus pressure Still not testing blood sugars, but involved in program with the senior's group which he states is very helpful in helping him to understand diet and commit to exercise regime    Review of Systems See HPI Denies recent fever or chills. Denies sinus pressure, nasal congestion, ear pain or sore throat. Denies chest pains, palpitations and leg swelling Denies abdominal pain, nausea, vomiting,diarrhea or constipation.   Denies dysuria, frequency, hesitancy or incontinence. Denies joint pain, swelling and limitation in mobility. Denies headaches, seizures, numbness, or tingling. Denies depression, anxiety or insomnia. Denies skin break down or rash.        Objective:   Physical Exam Patient alert and oriented and in no cardiopulmonary distress.  HEENT: No facial asymmetry, EOMI, no sinus tenderness,  oropharynx pink and moist.  Neck supple no adenopathy.  Chest: decreased air entry, few scattered crackles, no wheezes  CVS: S1, S2 no murmurs, no S3.  ABD: Soft non tender. Bowel sounds normal.  Ext: No edema  MS: Adequate ROM spine, shoulders, hips and knees.  Skin: Intact, no ulcerations or rash noted.  Psych: Good eye contact, normal affect. Memory intact not anxious or depressed appearing.  CNS: CN 2-12 intact, power, tone and  sensation normal throughout.        Assessment & Plan:

## 2012-03-03 NOTE — Patient Instructions (Addendum)
F/u in 3.5 month, with rectal exam  New medication for blood pressure, other medication for cholesterol and blood sugar will be sent after lab work is obtained  Remember to cut back on cigarettes, your birthday should be your quit date if you do not quit before.  Antibiotics, decongestant and allergy medication is prescribed  Labs today, HBA1C, cmp and egfr, lipid and TSH.  Microalb will be sent  Please work on enjoying getting to 90 plus a little bit more!

## 2012-03-04 LAB — MICROALBUMIN / CREATININE URINE RATIO
Creatinine, Urine: 142.4 mg/dL
Microalb, Ur: 2.19 mg/dL — ABNORMAL HIGH (ref 0.00–1.89)

## 2012-03-04 LAB — COMPLETE METABOLIC PANEL WITH GFR
ALT: 10 U/L (ref 0–53)
CO2: 28 mEq/L (ref 19–32)
Calcium: 9.5 mg/dL (ref 8.4–10.5)
Chloride: 104 mEq/L (ref 96–112)
Creat: 0.88 mg/dL (ref 0.50–1.35)
GFR, Est African American: >89 mL/min

## 2012-03-04 LAB — LIPID PANEL
Cholesterol: 163 mg/dL (ref 0–200)
LDL Cholesterol: 111 mg/dL — ABNORMAL HIGH (ref 0–99)
Triglycerides: 111 mg/dL (ref <150)

## 2012-03-04 LAB — HEMOGLOBIN A1C: Mean Plasma Glucose: 189 mg/dL — ABNORMAL HIGH (ref <117)

## 2012-03-05 ENCOUNTER — Other Ambulatory Visit: Payer: Self-pay | Admitting: Family Medicine

## 2012-03-24 NOTE — Assessment & Plan Note (Signed)
Uncontrolled, adjustment in medication, improved pt education so he is better able to follow through with plan

## 2012-03-24 NOTE — Assessment & Plan Note (Signed)
Not at goal, increased exercise and reduced sodium intake discussed and encouraged

## 2012-03-24 NOTE — Assessment & Plan Note (Signed)
Improved, still needs to reduce fat intake to reach goal

## 2012-03-24 NOTE — Assessment & Plan Note (Signed)
Antibiotic and decongestant prescribed

## 2012-03-24 NOTE — Assessment & Plan Note (Signed)
Improved, trying to quit

## 2012-03-26 ENCOUNTER — Telehealth: Payer: Self-pay

## 2012-03-26 MED ORDER — GLIPIZIDE ER 10 MG PO TB24: 10.0000 mg | ORAL_TABLET | Freq: Every day | ORAL | Status: DC

## 2012-03-26 MED ORDER — PRAVASTATIN SODIUM 40 MG PO TABS: 40.0000 mg | ORAL_TABLET | Freq: Every day | ORAL | Status: DC

## 2012-03-26 MED ORDER — GLIPIZIDE ER 5 MG PO TB24: 5.0000 mg | ORAL_TABLET | Freq: Every day | ORAL | Status: DC

## 2012-03-26 MED ORDER — METFORMIN HCL 1000 MG PO TABS: 1000.0000 mg | ORAL_TABLET | Freq: Two times a day (BID) | ORAL | Status: DC

## 2012-03-27 ENCOUNTER — Telehealth: Payer: Self-pay | Admitting: Family Medicine

## 2012-04-06 NOTE — Telephone Encounter (Signed)
noted 

## 2012-04-14 NOTE — Telephone Encounter (Signed)
No refills until patient calls office. Have been unable to reach him

## 2012-04-14 NOTE — Telephone Encounter (Signed)
Patient contacted and aware

## 2012-06-11 ENCOUNTER — Encounter: Payer: Self-pay | Admitting: Family Medicine

## 2012-06-11 ENCOUNTER — Ambulatory Visit (INDEPENDENT_AMBULATORY_CARE_PROVIDER_SITE_OTHER): Payer: Medicare Other | Admitting: Family Medicine

## 2012-06-11 VITALS — BP 156/90 | HR 84 | Resp 16 | Ht 75.0 in | Wt 196.4 lb

## 2012-06-11 DIAGNOSIS — E119 Type 2 diabetes mellitus without complications: Secondary | ICD-10-CM

## 2012-06-11 DIAGNOSIS — Z125 Encounter for screening for malignant neoplasm of prostate: Secondary | ICD-10-CM | POA: Diagnosis not present

## 2012-06-11 DIAGNOSIS — R5383 Other fatigue: Secondary | ICD-10-CM

## 2012-06-11 DIAGNOSIS — I10 Essential (primary) hypertension: Secondary | ICD-10-CM | POA: Diagnosis not present

## 2012-06-11 DIAGNOSIS — E785 Hyperlipidemia, unspecified: Secondary | ICD-10-CM

## 2012-06-11 DIAGNOSIS — F17200 Nicotine dependence, unspecified, uncomplicated: Secondary | ICD-10-CM | POA: Diagnosis not present

## 2012-06-11 DIAGNOSIS — Z9889 Other specified postprocedural states: Secondary | ICD-10-CM

## 2012-06-11 DIAGNOSIS — R5381 Other malaise: Secondary | ICD-10-CM

## 2012-06-11 DIAGNOSIS — N058 Unspecified nephritic syndrome with other morphologic changes: Secondary | ICD-10-CM

## 2012-06-11 DIAGNOSIS — Z860100 Personal history of colon polyps, unspecified: Secondary | ICD-10-CM | POA: Insufficient documentation

## 2012-06-11 DIAGNOSIS — E1129 Type 2 diabetes mellitus with other diabetic kidney complication: Secondary | ICD-10-CM

## 2012-06-11 LAB — CBC WITH DIFFERENTIAL/PLATELET
Basophils Absolute: 0 K/uL (ref 0.0–0.1)
Basophils Relative: 0 % (ref 0–1)
Eosinophils Absolute: 0 K/uL (ref 0.0–0.7)
Eosinophils Relative: 0 % (ref 0–5)
HCT: 46.5 % (ref 39.0–52.0)
Hemoglobin: 16.7 g/dL (ref 13.0–17.0)
Lymphocytes Relative: 11 % — ABNORMAL LOW (ref 12–46)
Lymphs Abs: 0.9 K/uL (ref 0.7–4.0)
MCH: 30.3 pg (ref 26.0–34.0)
MCHC: 35.9 g/dL (ref 30.0–36.0)
MCV: 84.4 fL (ref 78.0–100.0)
Monocytes Absolute: 0.4 K/uL (ref 0.1–1.0)
Monocytes Relative: 5 % (ref 3–12)
Neutro Abs: 6.8 K/uL (ref 1.7–7.7)
Neutrophils Relative %: 84 % — ABNORMAL HIGH (ref 43–77)
Platelets: 226 K/uL (ref 150–400)
RBC: 5.51 MIL/uL (ref 4.22–5.81)
RDW: 14.1 % (ref 11.5–15.5)
WBC: 8.1 K/uL (ref 4.0–10.5)

## 2012-06-11 LAB — BASIC METABOLIC PANEL WITH GFR
BUN: 15 mg/dL (ref 6–23)
CO2: 26 meq/L (ref 19–32)
Calcium: 9.8 mg/dL (ref 8.4–10.5)
Chloride: 103 meq/L (ref 96–112)
Creat: 0.99 mg/dL (ref 0.50–1.35)
Glucose, Bld: 114 mg/dL — ABNORMAL HIGH (ref 70–99)
Potassium: 4.7 meq/L (ref 3.5–5.3)
Sodium: 139 meq/L (ref 135–145)

## 2012-06-11 LAB — HEMOGLOBIN A1C
Hgb A1c MFr Bld: 7.1 % — ABNORMAL HIGH (ref <5.7)
Mean Plasma Glucose: 157 mg/dL — ABNORMAL HIGH (ref <117)

## 2012-06-11 LAB — PSA, MEDICARE: PSA: 0.47 ng/mL

## 2012-06-11 LAB — GLUCOSE, POCT (MANUAL RESULT ENTRY): POC Glucose: 129 mg/dL — AB (ref 70–99)

## 2012-06-11 NOTE — Patient Instructions (Addendum)
Annual wellness in 6 weeks  HBA1C, PSa, CBC, chem 7   Blood pressure is up to 156/90 today, please cut back on salt, and cigarettes as dicussed.  I hope you continue to feel well  It is important that you exercise regularly at least 60 minutes 5 times a week. If you develop chest pain, have severe difficulty breathing, or feel very tired, stop exercising immediately and seek medical attention    A healthy diet is rich in fruit, vegetables and whole grains. Poultry fish, nuts and beans are a healthy choice for protein rather then red meat. A low sodium diet and drinking 64 ounces of water daily is generally recommended. Oils and sweet should be limited. Carbohydrates especially for those who are diabetic or overweight, should be limited to 30-45 gram per meal. It is important to eat on a regular schedule, at least 3   You will be referred for screening colonsccopy.

## 2012-06-11 NOTE — Assessment & Plan Note (Signed)
Uncontrolled, intolerant of metformin will review today's lab.  Blood sugar this am 129

## 2012-06-11 NOTE — Assessment & Plan Note (Signed)
Hyperlipidemia:Low fat diet discussed and encouraged.  Uncontrolled with elevated LDL when last checked, no med change at this time

## 2012-06-11 NOTE — Assessment & Plan Note (Signed)
Uncontrolled, no med change at this visit  DASH diet and commitment to daily physical activity for a minimum of 30 minutes discussed and encouraged, as a part of hypertension management. The importance of attaining a healthy weight is also discussed.

## 2012-06-11 NOTE — Assessment & Plan Note (Signed)

## 2012-06-11 NOTE — Progress Notes (Signed)
  Subjective:    Patient ID: [name], male    DOB: 1945-11-19, 66 y.o.   MRN: [id number]  HPI The PT is here for follow up and re-evaluation of chronic medical conditions, medication management and review of any available recent lab and radiology data.  Preventive health is updated, specifically  Cancer screening and Immunization. States last colonoscopy was over 6 years ago and he should have rept in 5 years due to polyps Questions or concerns regarding consultations or procedures which the PT has had in the interim are  addressed. States blood sugar is being tested at the center 130 to 140. States blood pressure is good when checked Still smokes 10 ciggs per day, no quit date set trying to cut back    Review of Systems See HPI Denies recent fever or chills. Denies sinus pressure, nasal congestion, ear pain or sore throat. Denies chest congestion, productive cough or wheezing. Denies chest pains, palpitations and leg swelling Denies abdominal pain, nausea, vomiting,diarrhea or constipation.   Denies dysuria, frequency, hesitancy or incontinence. Denies joint pain, swelling and limitation in mobility. Denies headaches, seizures, numbness, or tingling. Denies depression, anxiety or insomnia. Denies skin break down or rash.        Objective:   Physical Exam  Patient alert and oriented and in no cardiopulmonary distress.  HEENT: No facial asymmetry, EOMI, no sinus tenderness,  oropharynx pink and moist.  Neck supple no adenopathy.  Chest: Clear to auscultation bilaterally.Decreased air entry throughout  CVS: S1, S2 no murmurs, no S3.  ABD: Soft non tender. Bowel sounds normal.  Ext: No edema  MS: Adequate ROM spine, shoulders, hips and knees.  Skin: Intact, no ulcerations or rash noted.  Psych: Good eye contact, normal affect. Memory intact not anxious or depressed appearing.  CNS: CN 2-12 intact, power, tone and sensation normal throughout.         Assessment & Plan:

## 2012-06-16 ENCOUNTER — Encounter (INDEPENDENT_AMBULATORY_CARE_PROVIDER_SITE_OTHER): Payer: Self-pay | Admitting: *Deleted

## 2012-07-20 ENCOUNTER — Telehealth: Payer: Self-pay | Admitting: Family Medicine

## 2012-07-20 NOTE — Telephone Encounter (Signed)
Called patient and left message stating I needed more information on symptoms and to call me back at the office

## 2012-07-21 NOTE — Telephone Encounter (Signed)
Pt states its related to his tooth. Got some antibiotics and will call dentist

## 2012-07-31 ENCOUNTER — Encounter: Payer: Medicare Other | Admitting: Family Medicine

## 2012-08-05 ENCOUNTER — Encounter: Payer: Self-pay | Admitting: Family Medicine

## 2012-08-05 ENCOUNTER — Ambulatory Visit (INDEPENDENT_AMBULATORY_CARE_PROVIDER_SITE_OTHER): Payer: Medicare Other | Admitting: Family Medicine

## 2012-08-05 VITALS — BP 150/84 | HR 89 | Resp 16 | Ht 75.0 in | Wt 198.0 lb

## 2012-08-05 DIAGNOSIS — F17200 Nicotine dependence, unspecified, uncomplicated: Secondary | ICD-10-CM

## 2012-08-05 DIAGNOSIS — E785 Hyperlipidemia, unspecified: Secondary | ICD-10-CM

## 2012-08-05 DIAGNOSIS — R5381 Other malaise: Secondary | ICD-10-CM | POA: Diagnosis not present

## 2012-08-05 DIAGNOSIS — E1129 Type 2 diabetes mellitus with other diabetic kidney complication: Secondary | ICD-10-CM

## 2012-08-05 DIAGNOSIS — R5383 Other fatigue: Secondary | ICD-10-CM

## 2012-08-05 DIAGNOSIS — R7301 Impaired fasting glucose: Secondary | ICD-10-CM | POA: Diagnosis not present

## 2012-08-05 DIAGNOSIS — N058 Unspecified nephritic syndrome with other morphologic changes: Secondary | ICD-10-CM

## 2012-08-05 DIAGNOSIS — I10 Essential (primary) hypertension: Secondary | ICD-10-CM | POA: Diagnosis not present

## 2012-08-05 DIAGNOSIS — K5289 Other specified noninfective gastroenteritis and colitis: Secondary | ICD-10-CM

## 2012-08-05 MED ORDER — ASPIRIN EC 81 MG PO TBEC: 81.0000 mg | DELAYED_RELEASE_TABLET | Freq: Every day | ORAL | Status: DC

## 2012-08-05 NOTE — Progress Notes (Signed)
  Subjective:    Patient ID: [name], male    DOB: 1946/04/11, 66 y.o.   MRN: [id number]  HPI The PT is here for follow up and re-evaluation of chronic medical conditions, medication management and review of any available recent lab and radiology data.  Preventive health is updated, specifically  Cancer screening and Immunization.   Questions or concerns regarding consultations or procedures which the PT has had in the interim are  addressed. The PT denies any adverse reactions to current medications since the last visit.  Just recovering in the past 2 days from recent severe stomach virus which he contracted while out of town, had loose stools, no emesis, stool is now formed. States he is now able to test sugar and fasting sugar is seldom over 130, denies hypoglycemic episodes, polyuria or polydipsia       Review of Systems See HPI Denies recent fever or chills. Denies sinus pressure, nasal congestion, ear pain or sore throat. Denies chest congestion, productive cough or wheezing. Denies chest pains, palpitations and leg swelling  Denies dysuria, frequency, hesitancy or incontinence. Denies joint pain, swelling and limitation in mobility. Denies headaches, seizures, numbness, or tingling. Denies depression, anxiety or insomnia. Denies skin break down or rash.        Objective:   Physical Exam  Patient alert and oriented and in no cardiopulmonary distress.  HEENT: No facial asymmetry, EOMI, no sinus tenderness,  oropharynx pink and moist.  Neck supple no adenopathy.  Chest: Clear to auscultation bilaterally.  CVS: S1, S2 no murmurs, no S3.  ABD: Soft non tender. Bowel sounds normal.  Ext: No edema  MS: Adequate ROM spine, shoulders, hips and knees.  Skin: Intact, no ulcerations or rash noted.  Psych: Good eye contact, normal affect. Memory intact not anxious or depressed appearing.  CNS: CN 2-12 intact, power, tone and sensation normal throughout.      Assessment & Plan:

## 2012-08-05 NOTE — Patient Instructions (Addendum)
Annual wellness early December  Fasting lipid, cmp and EGFR, HBA1C  Early December , before visit  I am happy that you have a quit date in mind for Thanksgiving, you will get helpful tips to quit also tele # to call for assistance toll  Free counseling  Congrats on much improved blood sugar.  You have recovered from stomach virus.  Blood pressure remains high in the office, I believe you need more medication   Please start aspirin 58m daily for protection for heart attack

## 2012-08-09 NOTE — Assessment & Plan Note (Signed)
Marked improvement, pt now at goal, no change in meds  Patient advised to reduce carb and sweets, commit to regular physical activity, take meds as prescribed, test blood as directed, and attempt to lose weight, to improve blood sugar control.

## 2012-08-09 NOTE — Assessment & Plan Note (Signed)
Hyperlipidemia:Low fat diet discussed and encouraged.  Not at goal.  Will rept labs prior to next visit

## 2012-08-09 NOTE — Assessment & Plan Note (Signed)
Elevated in office, pt insists normal values obtained elsewhere, though this is never the case here and he has nephropathy. He is to reconsider the decision to resist inc med , hopefully he will agree at next visit

## 2012-08-09 NOTE — Assessment & Plan Note (Addendum)
Smoking less and has a quit date in mind. He is strongly encouraged to follow through on this Patient counseled for approximately 5 minutes regarding the health risks of ongoing nicotine use, specifically all types of cancer, heart disease, stroke and respiratory failure. The options available for help with cessation ,the behavioral changes to assist the process, and the option to either gradully reduce usage  Or abruptly stop.is also discussed. Pt is also encouraged to set specific goals in number of cigarettes used daily, as well as to set a quit date.

## 2012-08-09 NOTE — Assessment & Plan Note (Signed)
Just recovering from acute episode

## 2012-10-14 ENCOUNTER — Other Ambulatory Visit: Payer: Self-pay | Admitting: Family Medicine

## 2012-10-28 ENCOUNTER — Ambulatory Visit: Payer: Medicare Other | Admitting: Family Medicine

## 2012-12-09 ENCOUNTER — Ambulatory Visit (INDEPENDENT_AMBULATORY_CARE_PROVIDER_SITE_OTHER): Payer: Medicare Other | Admitting: Family Medicine

## 2012-12-09 ENCOUNTER — Encounter: Payer: Self-pay | Admitting: Family Medicine

## 2012-12-09 VITALS — BP 140/82 | HR 73 | Resp 16 | Ht 75.0 in | Wt 211.0 lb

## 2012-12-09 DIAGNOSIS — E119 Type 2 diabetes mellitus without complications: Secondary | ICD-10-CM | POA: Diagnosis not present

## 2012-12-09 DIAGNOSIS — E785 Hyperlipidemia, unspecified: Secondary | ICD-10-CM | POA: Diagnosis not present

## 2012-12-09 DIAGNOSIS — I10 Essential (primary) hypertension: Secondary | ICD-10-CM

## 2012-12-09 DIAGNOSIS — E1129 Type 2 diabetes mellitus with other diabetic kidney complication: Secondary | ICD-10-CM

## 2012-12-09 DIAGNOSIS — J019 Acute sinusitis, unspecified: Secondary | ICD-10-CM

## 2012-12-09 DIAGNOSIS — R5383 Other fatigue: Secondary | ICD-10-CM | POA: Diagnosis not present

## 2012-12-09 DIAGNOSIS — R5381 Other malaise: Secondary | ICD-10-CM

## 2012-12-09 DIAGNOSIS — N058 Unspecified nephritic syndrome with other morphologic changes: Secondary | ICD-10-CM

## 2012-12-09 DIAGNOSIS — F17200 Nicotine dependence, unspecified, uncomplicated: Secondary | ICD-10-CM

## 2012-12-09 MED ORDER — FLUTICASONE PROPIONATE 50 MCG/ACT NA SUSP: 2.0000 | Freq: Every day | NASAL | Status: DC

## 2012-12-09 MED ORDER — ASPIRIN EC 81 MG PO TBEC: 81.0000 mg | DELAYED_RELEASE_TABLET | Freq: Every day | ORAL | Status: AC

## 2012-12-09 MED ORDER — PRAVASTATIN SODIUM 40 MG PO TABS: 40.0000 mg | ORAL_TABLET | Freq: Every day | ORAL | Status: DC

## 2012-12-09 MED ORDER — AZITHROMYCIN 250 MG PO TABS: ORAL_TABLET | ORAL | Status: AC

## 2012-12-09 MED ORDER — GLIPIZIDE ER 10 MG PO TB24: 10.0000 mg | ORAL_TABLET | Freq: Every day | ORAL | Status: DC

## 2012-12-09 MED ORDER — LOSARTAN POTASSIUM 50 MG PO TABS: ORAL_TABLET | ORAL | Status: DC

## 2012-12-09 NOTE — Assessment & Plan Note (Signed)
Uncontrolled.Pt out of his losartan. DASH diet and commitment to daily physical activity for a minimum of 30 minutes discussed and encouraged, as a part of hypertension management. The importance of attaining a healthy weight is also discussed.

## 2012-12-09 NOTE — Assessment & Plan Note (Signed)
Updated lab needed, reports good blood sugars when [facility]. Patient educated about the importance of limiting  Carbohydrate intake , the need to commit to daily physical activity for a minimum of 30 minutes , and to commit weight loss. The fact that changes in all these areas will reduce or eliminate all together the development of diabetes is stressed.

## 2012-12-09 NOTE — Progress Notes (Signed)
  Subjective:    Patient ID: [name], male    DOB: 1946-06-04, 67 y.o.   MRN: [id number]  HPI The PT is here for follow up and re-evaluation of chronic medical conditions, medication management and review of any available recent lab and radiology data.  Preventive health is updated, specifically  Cancer screening and Immunization.   Questions or concerns regarding consultations or procedures which the PT has had in the interim are  addressed. The PT denies any adverse reactions to current medications since the last visit.  2 week h/o sinus pressure with increased nasal drainage at times bloody    Review of Systems See HPI Denies recent fever or chills. Denies ear pain or sore throat. Denies chest congestion, productive cough or wheezing. Denies chest pains, palpitations and leg swelling Denies abdominal pain, nausea, vomiting,diarrhea or constipation.   Denies dysuria, frequency, hesitancy or incontinence. Denies joint pain, swelling and limitation in mobility. Denies headaches, seizures, numbness, or tingling. Denies depression, anxiety or insomnia. Denies skin break down or rash.        Objective:   Physical Exam  Patient alert and oriented and in no cardiopulmonary distress.  HEENT: No facial asymmetry, EOMI, maxillary  sinus tenderness,  oropharynx pink and moist.  Neck supple no adenopathy.  Chest: Clear to auscultation bilaterally.  CVS: S1, S2 no murmurs, no S3.  ABD: Soft non tender. Bowel sounds normal.  Ext: No edema  MS: Adequate ROM spine, shoulders, hips and knees.  Skin: Intact, no ulcerations or rash noted.  Psych: Good eye contact, normal affect. Memory intact not anxious or depressed appearing.  CNS: CN 2-12 intact, power, tone and sensation normal throughout.       Assessment & Plan:

## 2012-12-09 NOTE — Patient Instructions (Signed)
Annual wellness, in 4 month.    Your blood pressure is slightly elevated at this visit, you NEED the losartan  Please stop smoking  Fasting lipid, cmp and EGFR, HBA1C as soon as possible.  Please sign for your colonoscopy records espescialy since you have polyps

## 2012-12-09 NOTE — Assessment & Plan Note (Signed)
Acute infection , antibiotic prescribed 

## 2012-12-09 NOTE — Assessment & Plan Note (Signed)
Hyperlipidemia:Low fat diet discussed and encouraged.  Updated lab this week

## 2012-12-09 NOTE — Assessment & Plan Note (Signed)
Unchanged. Patient counseled for approximately 5 minutes regarding the health risks of ongoing nicotine use, specifically all types of cancer, heart disease, stroke and respiratory failure. The options available for help with cessation ,the behavioral changes to assist the process, and the option to either gradully reduce usage  Or abruptly stop.is also discussed. Pt is also encouraged to set specific goals in number of cigarettes used daily, as well as to set a quit date.

## 2012-12-11 DIAGNOSIS — E119 Type 2 diabetes mellitus without complications: Secondary | ICD-10-CM | POA: Diagnosis not present

## 2012-12-11 DIAGNOSIS — I10 Essential (primary) hypertension: Secondary | ICD-10-CM | POA: Diagnosis not present

## 2012-12-11 DIAGNOSIS — E785 Hyperlipidemia, unspecified: Secondary | ICD-10-CM | POA: Diagnosis not present

## 2012-12-11 LAB — LIPID PANEL
Cholesterol: 115 mg/dL (ref 0–200)
HDL: 27 mg/dL — ABNORMAL LOW (ref >39)
Total CHOL/HDL Ratio: 4.3 Ratio
VLDL: 28 mg/dL (ref 0–40)

## 2012-12-11 LAB — COMPLETE METABOLIC PANEL WITH GFR
AST: 15 U/L (ref 0–37)
Albumin: 4.4 g/dL (ref 3.5–5.2)
Alkaline Phosphatase: 51 U/L (ref 39–117)
BUN: 14 mg/dL (ref 6–23)
Creat: 1.03 mg/dL (ref 0.50–1.35)
GFR, Est Non African American: 75 mL/min
Glucose, Bld: 123 mg/dL — ABNORMAL HIGH (ref 70–99)
Potassium: 4.6 mEq/L (ref 3.5–5.3)
Total Bilirubin: 0.4 mg/dL (ref 0.3–1.2)

## 2012-12-11 LAB — HEMOGLOBIN A1C
Hgb A1c MFr Bld: 7.4 % — ABNORMAL HIGH (ref <5.7)
Mean Plasma Glucose: 166 mg/dL — ABNORMAL HIGH (ref <117)

## 2013-03-24 ENCOUNTER — Ambulatory Visit (INDEPENDENT_AMBULATORY_CARE_PROVIDER_SITE_OTHER): Payer: Medicare Other | Admitting: Family Medicine

## 2013-03-24 ENCOUNTER — Encounter: Payer: Self-pay | Admitting: Family Medicine

## 2013-03-24 VITALS — BP 150/84 | HR 82 | Resp 16 | Ht 75.0 in | Wt 214.0 lb

## 2013-03-24 DIAGNOSIS — E1129 Type 2 diabetes mellitus with other diabetic kidney complication: Secondary | ICD-10-CM | POA: Diagnosis not present

## 2013-03-24 DIAGNOSIS — R5383 Other fatigue: Secondary | ICD-10-CM | POA: Diagnosis not present

## 2013-03-24 DIAGNOSIS — Z23 Encounter for immunization: Secondary | ICD-10-CM | POA: Diagnosis not present

## 2013-03-24 DIAGNOSIS — H9193 Unspecified hearing loss, bilateral: Secondary | ICD-10-CM

## 2013-03-24 DIAGNOSIS — H9190 Unspecified hearing loss, unspecified ear: Secondary | ICD-10-CM

## 2013-03-24 DIAGNOSIS — E1149 Type 2 diabetes mellitus with other diabetic neurological complication: Secondary | ICD-10-CM | POA: Diagnosis not present

## 2013-03-24 DIAGNOSIS — R5381 Other malaise: Secondary | ICD-10-CM

## 2013-03-24 DIAGNOSIS — E785 Hyperlipidemia, unspecified: Secondary | ICD-10-CM | POA: Diagnosis not present

## 2013-03-24 DIAGNOSIS — N058 Unspecified nephritic syndrome with other morphologic changes: Secondary | ICD-10-CM

## 2013-03-24 DIAGNOSIS — E1142 Type 2 diabetes mellitus with diabetic polyneuropathy: Secondary | ICD-10-CM

## 2013-03-24 DIAGNOSIS — Z0000 Encounter for general adult medical examination without abnormal findings: Secondary | ICD-10-CM

## 2013-03-24 DIAGNOSIS — I10 Essential (primary) hypertension: Secondary | ICD-10-CM

## 2013-03-24 DIAGNOSIS — B353 Tinea pedis: Secondary | ICD-10-CM

## 2013-03-24 DIAGNOSIS — J42 Unspecified chronic bronchitis: Secondary | ICD-10-CM

## 2013-03-24 DIAGNOSIS — R0989 Other specified symptoms and signs involving the circulatory and respiratory systems: Secondary | ICD-10-CM

## 2013-03-24 DIAGNOSIS — E1140 Type 2 diabetes mellitus with diabetic neuropathy, unspecified: Secondary | ICD-10-CM

## 2013-03-24 LAB — COMPLETE METABOLIC PANEL WITH GFR
AST: 20 U/L (ref 0–37)
Alkaline Phosphatase: 50 U/L (ref 39–117)
BUN: 15 mg/dL (ref 6–23)
Creat: 1.1 mg/dL (ref 0.50–1.35)
GFR, Est Non African American: 69 mL/min
Glucose, Bld: 289 mg/dL — ABNORMAL HIGH (ref 70–99)
Total Bilirubin: 0.5 mg/dL (ref 0.3–1.2)

## 2013-03-24 LAB — LIPID PANEL
Cholesterol: 144 mg/dL (ref 0–200)
HDL: 31 mg/dL — ABNORMAL LOW (ref >39)
LDL Cholesterol: 85 mg/dL (ref 0–99)
Triglycerides: 141 mg/dL (ref <150)

## 2013-03-24 LAB — HEMOGLOBIN A1C
Hgb A1c MFr Bld: 9.7 % — ABNORMAL HIGH (ref <5.7)
Mean Plasma Glucose: 232 mg/dL — ABNORMAL HIGH (ref <117)

## 2013-03-24 MED ORDER — FLUTICASONE PROPIONATE 50 MCG/ACT NA SUSP: 2.0000 | Freq: Every day | NASAL | Status: DC

## 2013-03-24 MED ORDER — CLOTRIMAZOLE 1 % EX CREA: TOPICAL_CREAM | Freq: Two times a day (BID) | CUTANEOUS | Status: DC

## 2013-03-24 NOTE — Assessment & Plan Note (Signed)
Thickening and scaling of feet, needs improved foot care to include soaks as well as use of antifungal cream

## 2013-03-24 NOTE — Assessment & Plan Note (Signed)
Deteriorated Pt needs to resume low carb diet. Will add 2.107m additional glipizide. Needs rept HBa1C in 3 monht

## 2013-03-24 NOTE — Assessment & Plan Note (Signed)
Annual wellness exam as documented. Pt has reduced hearing also needs eye exam , referrals are made. Needs to learn to test blood sugars needs to discuss advanced directives with spouse Recently quit nicotine but blood sugar escalated out of control, needs to learn to test sugars still

## 2013-03-24 NOTE — Assessment & Plan Note (Signed)
50 pack year history , quit x 6 weeks, chronic bronchitis

## 2013-03-24 NOTE — Progress Notes (Signed)
Subjective:    Patient ID: [name], male    DOB: 09-Oct-1946, 67 y.o.   MRN: [id number]  HPI Preventive Screening-Counseling & Management   Patient present here today for a Medicare annual wellness visit. States he quit nicotine 6 weeks ago feels proud, however states he has been overeating also increased sugar intake. Still does not know how to test sugars.Has gained weight and believes sugar not as well controlled Very active, heading to state [employer] for [employer]   Current Problems (verified)   Medications Prior to Visit Allergies (verified)   PAST HISTORY  Family History: no f/o depression and dementia  Social History Married x 8, 2nd marriage , 3 adult kids. Quit nicotine x 40 days. Quit alcohol x20 years no drug use Retired at age 30 from [employer], disability due to rotator  cuff   Risk Factors  Current exercise habits:  Regular  Going to state [employer]   Dietary issues discussed:Fruit and veg and water   Cardiac risk factors: diabetes  Depression Screen  (Note: if answer to either of the following is "Yes", a more complete depression screening is indicated)   Over the past two weeks, have you felt down, depressed or hopeless? No  Over the past two weeks, have you felt little interest or pleasure in doing things? No  Have you lost interest or pleasure in daily life? No  Do you often feel hopeless? No  Do you cry easily over simple problems? No   Activities of Daily Living  In your present state of health, do you have any difficulty performing the following activities?  Driving?: No Managing money?: No Feeding yourself?:No Getting from bed to chair?:No Climbing a flight of stairs?:No Preparing food and eating?:No Bathing or showering?:No Getting dressed?:No Getting to the toilet?:No Using the toilet?:No Moving around from place to place?: No  Fall Risk Assessment In the past year have you fallen or had a near fall?:No Are you currently  taking any medications that make you dizziness?:No   Hearing Difficulties: No Do you often ask people to speak up or repeat themselves?:sometimes Do you experience ringing or noises in your ears?:No Do you have difficulty understanding soft or whispered voices?:yes  Cognitive Testing  Alert? Yes Normal Appearance?Yes  Oriented to person? Yes Place? Yes  Time? Yes  Displays appropriate judgment?Yes  Can read the correct time from a watch face? yes Are you having problems remembering things?No  Advanced Directives have been discussed with the patient?Yes, full code , does not want indefinite support with brain deathor extremely poor outcome    List the Names of Other Physician/Practitioners you currently use: dr Richarda Osmond any recent Medical Services you may have received from other than [facility] providers in the past year (date may be approximate).   Assessment:    Annual Wellness Exam   Plan:    During the course of the visit the patient was educated and counseled about appropriate screening and preventive services including:  A healthy diet is rich in fruit, vegetables and whole grains. Poultry fish, nuts and beans are a healthy choice for protein rather then red meat. A low sodium diet and drinking 64 ounces of water daily is generally recommended. Oils and sweet should be limited. Carbohydrates especially for those who are diabetic or overweight, should be limited to 30-45 gram per meal. It is important to eat on a regular schedule, at least 3 times daily. Snacks should be primarily fruits, vegetables or nuts. It  is important that you exercise regularly at least 30 minutes 5 times a week. If you develop chest pain, have severe difficulty breathing, or feel very tired, stop exercising immediately and seek medical attention  Immunization reviewed and updated. Cancer screening reviewed and updated    Patient Instructions (the written plan) was given to the patient.   Medicare Attestation  I have personally reviewed:  The patient's medical and social history  Their use of alcohol, tobacco or illicit drugs  Their current medications and supplements  The patient's functional ability including ADLs,fall risks, home safety risks, cognitive, and hearing and visual impairment  Diet and physical activities  Evidence for depression or mood disorders  The patient's weight, height, BMI, and visual acuity have been recorded in the chart. I have made referrals, counseling, and provided education to the patient based on review of the above and I have provided the patient with a written personalized care plan for preventive services.      Review of Systems     Objective:   Physical Exam  Patient alert and oriented and in no cardiopulmonary distress.  HEENT: No facial asymmetry, EOMI, no sinus tenderness,  oropharynx pink and moist.  Neck supple no adenopathy.carotid bruit  Chest: Clear to auscultation bilaterally.decreased though adequate air entry  CVS: S1, S2 no murmurs, no S3.  ABD: Soft non tender. Bowel sounds normal.  Ext: No edema  MS: Adequate ROM spine, shoulders, hips and knees.  Skin: Intact, bilateral tinea pedis Psych: Good eye contact, normal affect. Memory intact not anxious or depressed appearing.  CNS: CN 2-12 intact, power, normal throughout       Assessment & Plan:

## 2013-03-24 NOTE — Patient Instructions (Addendum)
F/u in 4 month  CONGRATS on smoking cessation, eat fruits as your sweet  Pneumovaccine today   [employer] [place] your [employer]   You will be referred for eye exam and hearing eval  You do qualify for diabetic shoes and will get a script  Fasting lipid, cmp and EGFR, hBA1C, tsh today  Microalb from office today  Med sent in for fungal foot infection, use for 2 weeks then use vaseline  On your feet at night   Soak feet and wash every week  You are referred for carotid doppler study and for CXR also.  Last colonoscopy to be obtained

## 2013-03-25 LAB — MICROALBUMIN / CREATININE URINE RATIO
Creatinine, Urine: 138.4 mg/dL
Microalb Creat Ratio: 88.5 mg/g — ABNORMAL HIGH (ref 0.0–30.0)

## 2013-03-29 MED ORDER — GLIPIZIDE ER 2.5 MG PO TB24: 2.5000 mg | ORAL_TABLET | Freq: Every day | ORAL | Status: DC

## 2013-03-29 NOTE — Addendum Note (Signed)
Addended by: Denman George B on: 03/29/2013 11:40 AM   Modules accepted: Orders

## 2013-04-06 ENCOUNTER — Other Ambulatory Visit (HOSPITAL_COMMUNITY): Payer: Medicare Other

## 2013-04-09 ENCOUNTER — Ambulatory Visit (HOSPITAL_COMMUNITY)
Admission: RE | Admit: 2013-04-09 | Discharge: 2013-04-09 | Disposition: A | Discharge status: Home / Self Care | Payer: Medicare Other | Source: Ambulatory Visit | Attending: Family Medicine | Admitting: Family Medicine

## 2013-04-09 DIAGNOSIS — R0989 Other specified symptoms and signs involving the circulatory and respiratory systems: Secondary | ICD-10-CM | POA: Insufficient documentation

## 2013-04-09 DIAGNOSIS — I658 Occlusion and stenosis of other precerebral arteries: Secondary | ICD-10-CM | POA: Diagnosis not present

## 2013-04-15 ENCOUNTER — Ambulatory Visit (INDEPENDENT_AMBULATORY_CARE_PROVIDER_SITE_OTHER): Payer: Medicare Other | Admitting: Otolaryngology

## 2013-04-15 DIAGNOSIS — H903 Sensorineural hearing loss, bilateral: Secondary | ICD-10-CM | POA: Diagnosis not present

## 2013-04-20 DIAGNOSIS — H2510 Age-related nuclear cataract, unspecified eye: Secondary | ICD-10-CM | POA: Diagnosis not present

## 2013-04-20 DIAGNOSIS — Z961 Presence of intraocular lens: Secondary | ICD-10-CM | POA: Diagnosis not present

## 2013-06-24 DIAGNOSIS — N058 Unspecified nephritic syndrome with other morphologic changes: Secondary | ICD-10-CM | POA: Diagnosis not present

## 2013-06-24 DIAGNOSIS — E1129 Type 2 diabetes mellitus with other diabetic kidney complication: Secondary | ICD-10-CM | POA: Diagnosis not present

## 2013-06-24 LAB — COMPLETE METABOLIC PANEL WITH GFR
Alkaline Phosphatase: 56 U/L (ref 39–117)
BUN: 16 mg/dL (ref 6–23)
GFR, Est Non African American: 71 mL/min
Glucose, Bld: 315 mg/dL — ABNORMAL HIGH (ref 70–99)
Total Bilirubin: 0.4 mg/dL (ref 0.3–1.2)

## 2013-06-24 LAB — HEMOGLOBIN A1C: Hgb A1c MFr Bld: 11.5 % — ABNORMAL HIGH (ref <5.7)

## 2013-06-28 ENCOUNTER — Telehealth (HOSPITAL_COMMUNITY): Payer: Self-pay | Admitting: Dietician

## 2013-06-28 ENCOUNTER — Ambulatory Visit (INDEPENDENT_AMBULATORY_CARE_PROVIDER_SITE_OTHER): Payer: Medicare Other | Admitting: Family Medicine

## 2013-06-28 ENCOUNTER — Encounter: Payer: Self-pay | Admitting: Family Medicine

## 2013-06-28 VITALS — BP 144/84 | HR 95 | Resp 16 | Wt 213.8 lb

## 2013-06-28 DIAGNOSIS — N058 Unspecified nephritic syndrome with other morphologic changes: Secondary | ICD-10-CM

## 2013-06-28 DIAGNOSIS — E785 Hyperlipidemia, unspecified: Secondary | ICD-10-CM

## 2013-06-28 DIAGNOSIS — I10 Essential (primary) hypertension: Secondary | ICD-10-CM | POA: Diagnosis not present

## 2013-06-28 DIAGNOSIS — E1129 Type 2 diabetes mellitus with other diabetic kidney complication: Secondary | ICD-10-CM

## 2013-06-28 DIAGNOSIS — G4700 Insomnia, unspecified: Secondary | ICD-10-CM | POA: Diagnosis not present

## 2013-06-28 DIAGNOSIS — E1169 Type 2 diabetes mellitus with other specified complication: Secondary | ICD-10-CM | POA: Insufficient documentation

## 2013-06-28 LAB — GLUCOSE, POCT (MANUAL RESULT ENTRY): POC Glucose: 325 mg/dl — AB (ref 70–99)

## 2013-06-28 MED ORDER — INSULIN ASPART 100 UNIT/ML ~~LOC~~ SOLN
5.0000 [IU] | Freq: Once | SUBCUTANEOUS | Status: AC
  Administered 2013-06-28: 5 [IU] via SUBCUTANEOUS

## 2013-06-28 MED ORDER — HYDROXYZINE HCL 10 MG PO TABS: ORAL_TABLET | ORAL | Status: AC

## 2013-06-28 NOTE — Assessment & Plan Note (Signed)
Dose increase to glipizide 15 mg total Pt to test twice daily and call in 3days , needs to stop sweetened drinks also

## 2013-06-28 NOTE — Telephone Encounter (Signed)
Received referral via fax from Dr. Moshe Cipro for dx: diabetes.

## 2013-06-28 NOTE — Assessment & Plan Note (Signed)
Poor sleep with anxiety , hydroxyzine prescribed

## 2013-06-28 NOTE — Assessment & Plan Note (Signed)
Controlled, no change in medication Hyperlipidemia:Low fat diet discussed and encouraged.  \ 

## 2013-06-28 NOTE — Telephone Encounter (Signed)
Called and left message at [phone].

## 2013-06-28 NOTE — Patient Instructions (Addendum)
f/u as before  Please test and write down twice daily, before breakfast and at bedtime, before snack  Goal are between  Before breakfast 110 to 140,  And bedtime 150 to 180  Increase glipizide 2.63m tab to TWO daily, and continue the 116mtablet ONE daily  Call with results on Thursday if you do not hear [place] referred for individual counseling   No sugar drinking, lemon /lime with water is good, diet soda is OK for calories, but remember 2 cans per day  CONGRATS on smoking cessation  Medication sent in to help with itch at bedtime and poor sleep, will calm your nerves

## 2013-06-28 NOTE — Progress Notes (Signed)
  Subjective:    Patient ID: [name], male    DOB: 1945-12-08, 67 y.o.   MRN: [id number]  HPI Pt in earlier than expected due to recent labs which show markedly elevated and uncontrolled blood sugar. States he has been drinking excessive amounts of  Sweetened lemonade, and feels this is the main problem. Most of his meals are obtained on the street also, eating habits need adiustment. Also to date he has not been testing his blood sugars, however , before he left today, he tested his sugar himself, now he will need to test twice  Daily and call in on a regular basis till next seen Reports excessive thirst and urination, denies blurred vision C/o excessive itching in the scalp at bedtime, wants medication for his nerves, thinks this is the [problem, no flaking of skin noted   Review of Systems See HPI Denies recent fever or chills. Denies sinus pressure, nasal congestion, ear pain or sore throat. Denies chest congestion, productive cough or wheezing. Denies chest pains, palpitations and leg swelling Denies abdominal pain, nausea, vomiting,diarrhea or constipation.   Denies joint pain, swelling and limitation in mobility. Denies headaches, seizures, numbness, or tingling. Denies depression, c/o  anxiety and  insomnia.       Objective:   Physical Exam  Patient alert and oriented and in no cardiopulmonary distress.  HEENT: No facial asymmetry, EOMI, no sinus tenderness,  oropharynx pink and moist.  Neck supple no adenopathy.  Chest: Clear to auscultation bilaterally.  CVS: S1, S2 no murmurs, no S3.  ABD: Soft non tender. Bowel sounds normal.  Ext: No edema  MS: Adequate ROM spine, shoulders, hips and knees.  Skin: Intact, no ulcerations or rash noted.  Psych: Good eye contact, normal affect. Memory intact not anxious or depressed appearing.  CNS: CN 2-12 intact, power, tone and sensation normal throughout.       Assessment & Plan:

## 2013-06-28 NOTE — Assessment & Plan Note (Signed)
Controlled, no change in medication DASH diet and commitment to daily physical activity for a minimum of 30 minutes discussed and encouraged, as a part of hypertension management. The importance of attaining a healthy weight is also discussed.

## 2013-06-28 NOTE — Telephone Encounter (Signed)
Received another referral that was sent at 1341. Pt has already been contacted today. Duplicate referral discarded.

## 2013-07-05 NOTE — Telephone Encounter (Signed)
No response from previous contact attempt. Sent letter to pt home via [place] [employer] in attempt to contact pt to schedule appointment.

## 2013-07-08 NOTE — Telephone Encounter (Signed)
Noted.

## 2013-07-08 NOTE — Telephone Encounter (Signed)
Pt has not responded to attempts to contact to schedule appointment. Referral filed.

## 2013-07-27 ENCOUNTER — Ambulatory Visit (INDEPENDENT_AMBULATORY_CARE_PROVIDER_SITE_OTHER): Payer: Medicare Other | Admitting: Family Medicine

## 2013-07-27 ENCOUNTER — Encounter: Payer: Self-pay | Admitting: Family Medicine

## 2013-07-27 VITALS — BP 140/84 | HR 94 | Resp 16 | Wt 214.0 lb

## 2013-07-27 DIAGNOSIS — E1129 Type 2 diabetes mellitus with other diabetic kidney complication: Secondary | ICD-10-CM | POA: Diagnosis not present

## 2013-07-27 DIAGNOSIS — I10 Essential (primary) hypertension: Secondary | ICD-10-CM

## 2013-07-27 DIAGNOSIS — Z125 Encounter for screening for malignant neoplasm of prostate: Secondary | ICD-10-CM

## 2013-07-27 DIAGNOSIS — R5381 Other malaise: Secondary | ICD-10-CM

## 2013-07-27 DIAGNOSIS — N058 Unspecified nephritic syndrome with other morphologic changes: Secondary | ICD-10-CM | POA: Diagnosis not present

## 2013-07-27 DIAGNOSIS — E785 Hyperlipidemia, unspecified: Secondary | ICD-10-CM | POA: Diagnosis not present

## 2013-07-27 LAB — GLUCOSE, POCT (MANUAL RESULT ENTRY): POC Glucose: 319 mg/dl — AB (ref 70–99)

## 2013-07-27 MED ORDER — INSULIN ASPART 100 UNIT/ML ~~LOC~~ SOLN
5.0000 [IU] | Freq: Once | SUBCUTANEOUS | Status: AC
  Administered 2013-07-27: 5 [IU] via SUBCUTANEOUS

## 2013-07-27 NOTE — Progress Notes (Signed)
  Subjective:    Patient ID: [name], male    DOB: 1946/10/14, 67 y.o.   MRN: [id number]  HPI  Pt brought in due to marked deterioration in his blood sugar control. He denies polyuria, polydipsia or fatigue, states he feels well, unfortunately neither testing regulalrly nor taking medication as prescribed and clearly stated on his bottle. He needs  Help, and I will look for this through home health referral, he is in agree,ment  Review of Systems See HPI Denies recent fever or chills. Denies sinus pressure, nasal congestion, ear pain or sore throat. Denies chest congestion, productive cough or wheezing. Denies chest pains, palpitations and leg swelling Denies abdominal pain, nausea, vomiting,diarrhea or constipation.   Denies dysuria, frequency, hesitancy or incontinence. Denies joint pain, swelling and limitation in mobility. Denies headaches, seizures, numbness, or tingling. Denies depression, anxiety or insomnia. Denies skin break down or rash.        Objective:   Physical Exam  Patient alert and oriented and in no cardiopulmonary distress.  HEENT: No facial asymmetry, EOMI, no sinus tenderness,  oropharynx pink and moist.  Neck supple no adenopathy.  Chest: Clear to auscultation bilaterally.  CVS: S1, S2 no murmurs, no S3.  ABD: Soft non tender. Bowel sounds normal.  Ext: No edema  MS: Adequate ROM spine, shoulders, hips and knees.  Skin: Intact, no ulcerations or rash noted.  Psych: Good eye contact, normal affect. Memory intact not anxious or depressed appearing.  CNS: CN 2-12 intact, power, tone and sensation normal throughout.       Assessment & Plan:

## 2013-07-27 NOTE — Patient Instructions (Addendum)
F/u Nov 17 or after, call if you need me before  You are referred for home health nurse to visit you at home, test sugar for 3 to 5 days with you and let us know what your values are since they are not coinciding with what we have here. Call in 2 days if you have not got a call from [place]    Glipizide 36m ONE every morning  Glipizide 2.586mTWO every morniing  5 units of regular insulin will be given today in office since blood sugar is high  Please start regular exercise again  Fasting lipid, cmp and EGFr , hBA1C Nov 14 or after but before visit, also cBC and PSA  PLEASE BRING medication bottles to visit  Goal for fasting blood sugar ranges from 90 to 150 in the morning before breakfast or any meal and 140 to 180 2hrs after any meal or at bedtime.

## 2013-08-16 NOTE — Assessment & Plan Note (Signed)
Continues to deteriorate unfortunately. Home health to help pt with blood suagr testing , goals written and explained, also pt had not been taking his medication as prescribed, I went over this at the visit. He still needs to leatrn and be comfortable with blood sugar testing

## 2013-08-16 NOTE — Assessment & Plan Note (Signed)
Updated lab needed Hyperlipidemia:Low fat diet discussed and encouraged.

## 2013-08-16 NOTE — Assessment & Plan Note (Signed)
adeequate control, no change in medication

## 2013-08-19 ENCOUNTER — Other Ambulatory Visit: Payer: Self-pay | Admitting: Family Medicine

## 2013-09-27 DIAGNOSIS — E1129 Type 2 diabetes mellitus with other diabetic kidney complication: Secondary | ICD-10-CM | POA: Diagnosis not present

## 2013-09-27 DIAGNOSIS — Z125 Encounter for screening for malignant neoplasm of prostate: Secondary | ICD-10-CM | POA: Diagnosis not present

## 2013-09-27 DIAGNOSIS — N058 Unspecified nephritic syndrome with other morphologic changes: Secondary | ICD-10-CM | POA: Diagnosis not present

## 2013-09-27 DIAGNOSIS — E785 Hyperlipidemia, unspecified: Secondary | ICD-10-CM | POA: Diagnosis not present

## 2013-09-27 DIAGNOSIS — R5381 Other malaise: Secondary | ICD-10-CM | POA: Diagnosis not present

## 2013-09-27 LAB — LIPID PANEL
LDL Cholesterol: 83 mg/dL (ref 0–99)
Triglycerides: 157 mg/dL — ABNORMAL HIGH (ref <150)
VLDL: 31 mg/dL (ref 0–40)

## 2013-09-27 LAB — COMPLETE METABOLIC PANEL WITH GFR
ALT: 20 U/L (ref 0–53)
AST: 19 U/L (ref 0–37)
Albumin: 4 g/dL (ref 3.5–5.2)
CO2: 26 mEq/L (ref 19–32)
Calcium: 9.4 mg/dL (ref 8.4–10.5)
Chloride: 103 mEq/L (ref 96–112)
GFR, Est African American: 86 mL/min
Potassium: 4.3 mEq/L (ref 3.5–5.3)
Sodium: 138 mEq/L (ref 135–145)
Total Protein: 7.4 g/dL (ref 6.0–8.3)

## 2013-09-27 LAB — CBC WITH DIFFERENTIAL/PLATELET
Basophils Absolute: 0 10*3/uL (ref 0.0–0.1)
Eosinophils Absolute: 0.1 10*3/uL (ref 0.0–0.7)
Eosinophils Relative: 2 % (ref 0–5)
Lymphocytes Relative: 41 % (ref 12–46)
Lymphs Abs: 2.4 10*3/uL (ref 0.7–4.0)
MCV: 82.2 fL (ref 78.0–100.0)
Neutrophils Relative %: 49 % (ref 43–77)
Platelets: 215 10*3/uL (ref 150–400)
RBC: 5.28 MIL/uL (ref 4.22–5.81)
RDW: 14.1 % (ref 11.5–15.5)
WBC: 5.8 10*3/uL (ref 4.0–10.5)

## 2013-09-27 LAB — HEMOGLOBIN A1C: Hgb A1c MFr Bld: 12.1 % — ABNORMAL HIGH (ref <5.7)

## 2013-09-28 LAB — PSA, MEDICARE: PSA: 0.5 ng/mL (ref <=4.00)

## 2013-09-29 ENCOUNTER — Encounter: Payer: Self-pay | Admitting: Family Medicine

## 2013-09-29 ENCOUNTER — Ambulatory Visit (INDEPENDENT_AMBULATORY_CARE_PROVIDER_SITE_OTHER): Payer: Medicare Other | Admitting: Family Medicine

## 2013-09-29 ENCOUNTER — Encounter (INDEPENDENT_AMBULATORY_CARE_PROVIDER_SITE_OTHER): Payer: Self-pay

## 2013-09-29 VITALS — BP 142/82 | HR 94 | Resp 16 | Ht 75.0 in | Wt 213.4 lb

## 2013-09-29 DIAGNOSIS — Z9119 Patient's noncompliance with other medical treatment and regimen: Secondary | ICD-10-CM

## 2013-09-29 DIAGNOSIS — E1129 Type 2 diabetes mellitus with other diabetic kidney complication: Secondary | ICD-10-CM

## 2013-09-29 DIAGNOSIS — I10 Essential (primary) hypertension: Secondary | ICD-10-CM | POA: Diagnosis not present

## 2013-09-29 DIAGNOSIS — E785 Hyperlipidemia, unspecified: Secondary | ICD-10-CM

## 2013-09-29 DIAGNOSIS — N058 Unspecified nephritic syndrome with other morphologic changes: Secondary | ICD-10-CM | POA: Diagnosis not present

## 2013-09-29 MED ORDER — LOSARTAN POTASSIUM 50 MG PO TABS: ORAL_TABLET | ORAL | Status: DC

## 2013-09-29 MED ORDER — SITAGLIP PHOS-METFORMIN HCL ER 100-1000 MG PO TB24: ORAL_TABLET | ORAL | Status: DC

## 2013-09-29 MED ORDER — INSULIN ASPART 100 UNIT/ML ~~LOC~~ SOLN
5.0000 [IU] | Freq: Once | SUBCUTANEOUS | Status: AC
  Administered 2013-09-29: 5 [IU] via SUBCUTANEOUS

## 2013-09-29 MED ORDER — GLIPIZIDE ER 10 MG PO TB24: ORAL_TABLET | ORAL | Status: DC

## 2013-09-29 NOTE — Progress Notes (Signed)
  Subjective:    Patient ID: [name], male    DOB: [birth year], 67 y.o.   MRN: [id number]  HPI  The PT is here for follow up and re-evaluation of chronic medical conditions, medication management and review of any available recent lab and radiology data.  Preventive health is updated, specifically  Cancer screening and Immunization.   Questions or concerns regarding consultations or procedures which the PT has had in the interim are  addressed. The PT denies any adverse reactions to current medications since the last visit.  States he knows his sugar is not good since he has noted it to be fluctuating a lot in the past several weeks. He has noted fatigue, polyuria and polydipsia also, denies blurry vision. Does not believe tha diet has changed much to explain the blood sugar and no recent infection   Review of Systems See HPI Denies recent fever or chills. Denies sinus pressure, nasal congestion, ear pain or sore throat. Denies chest congestion, productive cough or wheezing. Denies chest pains, palpitations and leg swelling Denies abdominal pain, nausea, vomiting,diarrhea or constipation.   Denies joint pain, swelling and limitation in mobility. Denies headaches, seizures, numbness, or tingling. Denies depression, anxiety or insomnia. Denies skin break down or rash.        Objective:   Physical Exam Patient alert and oriented and in no cardiopulmonary distress.  HEENT: No facial asymmetry, EOMI, no sinus tenderness,  oropharynx pink and moist.  Neck supple no adenopathy.  Chest: Clear to auscultation bilaterally.  CVS: S1, S2 no murmurs, no S3.  ABD: Soft non tender. Bowel sounds normal.  Ext: No edema  MS: Adequate ROM spine, shoulders, hips and knees.  Skin: Intact, no ulcerations or rash noted.  Psych: Good eye contact, normal affect. Memory intact not anxious or depressed appearing.  CNS: CN 2-12 intact, power, tone and sensation normal throughout.        Assessment & Plan:

## 2013-09-29 NOTE — Patient Instructions (Addendum)
F/u in 2 weeks with your meter and diary  Blood sugar before breakfast or any meal should be 90 to 140, blood sugar at bedtime , 2 hrs after your popcorn snack should be 140 to 180.  Call with questions  Check sugars two times every day until you return and write down  5 units regular insulin in the office today  Stop glipizide 2.76m tablet   NEW starting today is glipizide 150mTWO every morning with breakfast, also janumet XR 100/1000 ONE tablet every day at supper (you will get samples to take with you of the bnew medication for 4 weeks total  If blood sugar is still too high when you return, then you will be referred to a diabetes specialist  Reconsider the flu vaccine please

## 2013-10-12 ENCOUNTER — Encounter: Payer: Self-pay | Admitting: Family Medicine

## 2013-10-12 ENCOUNTER — Ambulatory Visit (INDEPENDENT_AMBULATORY_CARE_PROVIDER_SITE_OTHER): Payer: Medicare Other | Admitting: Family Medicine

## 2013-10-12 VITALS — BP 148/88 | HR 99 | Resp 16 | Ht 75.0 in | Wt 214.1 lb

## 2013-10-12 DIAGNOSIS — I10 Essential (primary) hypertension: Secondary | ICD-10-CM

## 2013-10-12 DIAGNOSIS — N058 Unspecified nephritic syndrome with other morphologic changes: Secondary | ICD-10-CM

## 2013-10-12 DIAGNOSIS — E1129 Type 2 diabetes mellitus with other diabetic kidney complication: Secondary | ICD-10-CM | POA: Diagnosis not present

## 2013-10-12 DIAGNOSIS — E785 Hyperlipidemia, unspecified: Secondary | ICD-10-CM

## 2013-10-12 DIAGNOSIS — Z9119 Patient's noncompliance with other medical treatment and regimen: Secondary | ICD-10-CM

## 2013-10-12 NOTE — Progress Notes (Signed)
Subjective:    Patient ID: [name], male    DOB: [birth year], 67 y.o.   MRN: [id number]  HPI Checking blood sugars  Twice daily, morning sugars reportedly range from 122 to 141, this am in office bG is 191, pt states he ate sweet potato pie at 2:30 this morning.  Also testing 2 hours lunch states running around 150 Review of the meter he has brought in are not showing consistent testing that he is reporting Pt again not following through on referral for individual teaching in the hospital, states he feels better however, and he sounds convinced of reported numbers  Review of Systems See HPI Denies recent fever or chills. Denies sinus pressure, nasal congestion, ear pain or sore throat. Denies chest congestion, productive cough or wheezing. Denies chest pains, palpitations and leg swelling Denies abdominal pain, nausea, vomiting,diarrhea or constipation.   Denies dysuria, frequency, hesitancy or incontinence. Denies joint pain, swelling and limitation in mobility. Denies headaches, seizures, numbness, or tingling. Denies depression or  Anxiety, does have mild insomnia Denies skin break down or rash.        Objective:   Physical Exam  Patient alert and oriented and in no cardiopulmonary distress.  HEENT: No facial asymmetry, EOMI, no sinus tenderness,  oropharynx pink and moist.  Neck supple no adenopathy.  Chest: Clear to auscultation bilaterally.  CVS: S1, S2 no murmurs, no S3.  ABD: Soft non tender. Bowel sounds normal.  Ext: No edema  MS: Adequate ROM spine, shoulders, hips and knees.  Skin: Intact, no ulcerations or rash noted.  Psych: Good eye contact, normal affect. Memory intact not anxious or depressed appearing.  CNS: CN 2-12 intact, power, tone and sensation normal throughout.       Assessment & Plan:

## 2013-10-12 NOTE — Patient Instructions (Signed)
F/u 2nd week in February, call if you need me before please   Blood sugars you report are better, I hope that your report is accurate, since they are not showing on the meter you have.. Samples given of janumet 50/500, take tWO once daily , do NOT use with the prescribed janumet  Pls fill script for janumet 50/1000 ONE daily given to you today. If the cost is more than you can afford please call us back  Non fasting cmp and EGFr, HBA1C 2nd week in Feb bEFORE vist, pls get blood test at least 3 days before visit  If blood sugar gets too high or too low please call the office

## 2013-10-30 DIAGNOSIS — Z9119 Patient's noncompliance with other medical treatment and regimen: Secondary | ICD-10-CM | POA: Insufficient documentation

## 2013-10-30 NOTE — Assessment & Plan Note (Signed)
Pt refusing flu vaccine Pt continues to have uncontrolled blood sugars and is resistant to education sessions in he hospital, he will likely need to be referred to endo and I have explained to him that if he rmains uncontrolled , this will be done in his best interest

## 2013-10-30 NOTE — Assessment & Plan Note (Addendum)
Pt reports improved  Numbers however his meter is not at all coinciding with the reported numbers, will see what the HBa1C is when drawn, unfortunately , I am very skeptical of the reliability of the reporting Again encouraged pt to attends diabetic class at the hospital, he again assures me that he is getting help at his local [facility]

## 2013-10-30 NOTE — Assessment & Plan Note (Signed)
Hyperlipidemia:Low fat diet discussed and encouraged.  Tg slightly elevated, no med change , just tighter control of diet

## 2013-10-30 NOTE — Assessment & Plan Note (Signed)
Controlled, no change in medication DASH diet and commitment to daily physical activity for a minimum of 30 minutes discussed and encouraged, as a part of hypertension management. The importance of attaining a healthy weight is also discussed.  

## 2013-10-31 NOTE — Assessment & Plan Note (Signed)
Controlled, no change in medication Hyperlipidemia:Low fat diet discussed and encouraged TG mildly elevated and hDl low needs tro reduce fat and increase activity.

## 2013-10-31 NOTE — Assessment & Plan Note (Addendum)
Marked deterioration in blood sugar control. Educated pt personally as well as nurse in office, referred for assistance through [facility] and encouraged pt to call in re uncontrolled blood suagrs.u Will be referred to endo if no improvement in next hBa1C, my opinion is that he needs to go now but he is resistant to this Patient advised to reduce carb and sweets, commit to regular physical activity, take meds as prescribed, test blood as directed, and attempt to lose weight, to improve blood sugar control.

## 2013-10-31 NOTE — Assessment & Plan Note (Signed)
Controlled, no change in medication

## 2013-10-31 NOTE — Assessment & Plan Note (Signed)
Refuse flu vaccine

## 2013-11-29 ENCOUNTER — Other Ambulatory Visit: Payer: Self-pay

## 2013-11-29 DIAGNOSIS — IMO0002 Reserved for concepts with insufficient information to code with codable children: Secondary | ICD-10-CM

## 2013-11-29 DIAGNOSIS — E1165 Type 2 diabetes mellitus with hyperglycemia: Principal | ICD-10-CM

## 2013-11-29 DIAGNOSIS — E1129 Type 2 diabetes mellitus with other diabetic kidney complication: Secondary | ICD-10-CM

## 2013-11-29 MED ORDER — SITAGLIP PHOS-METFORMIN HCL ER 100-1000 MG PO TB24: ORAL_TABLET | ORAL | Status: DC

## 2013-12-24 ENCOUNTER — Ambulatory Visit: Payer: Medicare Other | Admitting: Family Medicine

## 2013-12-30 ENCOUNTER — Ambulatory Visit: Payer: Medicare Other | Admitting: Family Medicine

## 2014-01-14 DIAGNOSIS — E1129 Type 2 diabetes mellitus with other diabetic kidney complication: Secondary | ICD-10-CM | POA: Diagnosis not present

## 2014-01-14 DIAGNOSIS — N058 Unspecified nephritic syndrome with other morphologic changes: Secondary | ICD-10-CM | POA: Diagnosis not present

## 2014-01-14 LAB — COMPLETE METABOLIC PANEL WITH GFR
ALT: 17 U/L (ref 0–53)
AST: 17 U/L (ref 0–37)
Albumin: 4.1 g/dL (ref 3.5–5.2)
Alkaline Phosphatase: 55 U/L (ref 39–117)
BUN: 16 mg/dL (ref 6–23)
CO2: 26 mEq/L (ref 19–32)
Calcium: 9.6 mg/dL (ref 8.4–10.5)
Chloride: 99 mEq/L (ref 96–112)
Creat: 1.12 mg/dL (ref 0.50–1.35)
GFR, Est African American: 78 mL/min
GFR, Est Non African American: 67 mL/min
Glucose, Bld: 286 mg/dL — ABNORMAL HIGH (ref 70–99)
Potassium: 4.5 mEq/L (ref 3.5–5.3)
Sodium: 137 mEq/L (ref 135–145)
Total Bilirubin: 0.4 mg/dL (ref 0.2–1.2)
Total Protein: 7.4 g/dL (ref 6.0–8.3)

## 2014-01-14 LAB — HEMOGLOBIN A1C
Hgb A1c MFr Bld: 12.9 % — ABNORMAL HIGH (ref <5.7)
Mean Plasma Glucose: 324 mg/dL — ABNORMAL HIGH (ref <117)

## 2014-01-17 ENCOUNTER — Ambulatory Visit (INDEPENDENT_AMBULATORY_CARE_PROVIDER_SITE_OTHER): Payer: Medicare Other | Admitting: Family Medicine

## 2014-01-17 ENCOUNTER — Encounter (INDEPENDENT_AMBULATORY_CARE_PROVIDER_SITE_OTHER): Payer: Self-pay

## 2014-01-17 ENCOUNTER — Encounter: Payer: Self-pay | Admitting: Family Medicine

## 2014-01-17 VITALS — BP 144/88 | HR 101 | Resp 16 | Wt 210.0 lb

## 2014-01-17 DIAGNOSIS — Z9119 Patient's noncompliance with other medical treatment and regimen: Secondary | ICD-10-CM | POA: Diagnosis not present

## 2014-01-17 DIAGNOSIS — E785 Hyperlipidemia, unspecified: Secondary | ICD-10-CM | POA: Diagnosis not present

## 2014-01-17 DIAGNOSIS — J302 Other seasonal allergic rhinitis: Secondary | ICD-10-CM

## 2014-01-17 DIAGNOSIS — E1165 Type 2 diabetes mellitus with hyperglycemia: Secondary | ICD-10-CM | POA: Diagnosis not present

## 2014-01-17 DIAGNOSIS — I10 Essential (primary) hypertension: Secondary | ICD-10-CM | POA: Diagnosis not present

## 2014-01-17 DIAGNOSIS — J309 Allergic rhinitis, unspecified: Secondary | ICD-10-CM

## 2014-01-17 DIAGNOSIS — IMO0002 Reserved for concepts with insufficient information to code with codable children: Secondary | ICD-10-CM

## 2014-01-17 DIAGNOSIS — Z91199 Patient's noncompliance with other medical treatment and regimen due to unspecified reason: Secondary | ICD-10-CM | POA: Diagnosis not present

## 2014-01-17 DIAGNOSIS — Z1211 Encounter for screening for malignant neoplasm of colon: Secondary | ICD-10-CM

## 2014-01-17 DIAGNOSIS — E1129 Type 2 diabetes mellitus with other diabetic kidney complication: Secondary | ICD-10-CM

## 2014-01-17 MED ORDER — LOSARTAN POTASSIUM 100 MG PO TABS: 100.0000 mg | ORAL_TABLET | Freq: Every day | ORAL | Status: DC

## 2014-01-17 MED ORDER — FLUTICASONE PROPIONATE 50 MCG/ACT NA SUSP: 2.0000 | Freq: Every day | NASAL | Status: DC

## 2014-01-17 NOTE — Patient Instructions (Addendum)
F/u  mid May, call if you need me before  NEW higher dose of cozaar, take TWO 50 mg tablets once daily till done, new med is 1110m ONCE daily  Blood sugar is out of control, you will be notified of appt with specialist  Flonase sent in  You are referred for colonoscopyFall Prevention and Home Safety Falls cause injuries and can affect all age groups. It is possible to prevent falls.  HOW TO PREVENT FALLS  Wear shoes with rubber soles that do not have an opening for your toes.  Keep the inside and outside of your house well lit.  Use night lights throughout your home.  Remove clutter from floors.  Clean up floor spills.  Remove throw rugs or fasten them to the floor with carpet tape.  Do not place electrical cords across pathways.  Put grab bars by your tub, shower, and toilet. Do not use towel bars as grab bars.  Put handrails on both sides of the stairway. Fix loose handrails.  Do not climb on stools or stepladders, if possible.  Do not wax your floors.  Repair uneven or unsafe sidewalks, walkways, or stairs.  Keep items you use a lot within reach.  Be aware of pets.  Keep emergency numbers next to the telephone.  Put smoke detectors in your home and near bedrooms. Ask your doctor what other things you can do to prevent falls. Document Released: 08/24/2009 Document Revised: 04/28/2012 Document Reviewed: 01/28/2012 [facility] LLCPatient Information 2014 [place]

## 2014-01-18 DIAGNOSIS — J302 Other seasonal allergic rhinitis: Secondary | ICD-10-CM | POA: Insufficient documentation

## 2014-01-18 NOTE — Assessment & Plan Note (Signed)
Needs statin therapy due to increased CV risk , will address at next visit

## 2014-01-18 NOTE — Assessment & Plan Note (Signed)
Ongoing problem as far as diabetes is concerned in particular, however, now ready to get the care of an endocrinologist which I have recommended in the past year

## 2014-01-18 NOTE — Assessment & Plan Note (Signed)
Increased symptoms in the Spring, start flonase

## 2014-01-18 NOTE — Assessment & Plan Note (Signed)
Uncontrolled and not at goal, inc cozaar to 138m daily DASH diet and commitment to daily physical activity for a minimum of 30 minutes discussed and encouraged, as a part of hypertension management. The importance of attaining a healthy weight is also discussed.

## 2014-01-18 NOTE — Assessment & Plan Note (Signed)
Deteriorated, non compliant behavior, states unable to tolerate janumet due to diarheah and has not been taking this. Now ready to go to endocrinology as he "wants to live" Urgent referral done, hopefully he will be seen within the next 1 week Needs insulin and has not been started on this today

## 2014-01-18 NOTE — Progress Notes (Signed)
Subjective:    Patient ID: [name], male    DOB: 01-Feb-1946, 68 y.o.   MRN: [id number]  HPI The PT is here for follow up and re-evaluation of chronic medical conditions, medication management and review of any available recent lab and radiology data.  Preventive health is updated, specifically  Cancer screening and Immunization.   Needs colonoscopy and is referred , no known f/h of colon ca The PT states he has only been taking one janumet tab daily due to diarheah, did not call in to notify . Fasting sugar 205 this am Notes excessive thirst and dry mouth , denies blurry vision, thinks he "feels well"On learning how much higher his HBA1C is, he is finally ready to have endo treat him, really has no choice at this time     Review of Systems See HPI Denies recent fever or chills. Denies sinus pressure,  ear pain or sore throat.Increased nasal congestion and clear drainage in  Past  Week with the change in weather Denies chest congestion, productive cough or wheezing. Denies chest pains, palpitations and leg swelling Denies abdominal pain, nausea, vomiting,diarrhea or constipation.   Denies dysuria, frequency, hesitancy or incontinence. Denies joint pain, swelling and limitation in mobility. Denies headaches, seizures, numbness, or tingling. Denies depression, anxiety or insomnia. Denies skin break down or rash.        Objective:   Physical Exam BP 144/88  Pulse 101  Resp 16  Wt 210 lb (95.255 kg)  SpO2 96% Patient alert and oriented and in no cardiopulmonary distress.  HEENT: No facial asymmetry, EOMI, no sinus tenderness,  oropharynx pink and moist.  Neck supple no adenopathy.  Chest: Clear to auscultation bilaterally.  CVS: S1, S2 no murmurs, no S3.  ABD: Soft non tender. Bowel sounds normal.  Ext: No edema  MS: Adequate ROM spine, shoulders, hips and knees.  Skin: Intact, no ulcerations or rash noted.  Psych: Good eye contact, normal affect. Memory  intact not anxious or depressed appearing.  CNS: CN 2-12 intact, power, tone and sensation normal throughout.        Assessment & Plan:  DM (diabetes mellitus), type 2, uncontrolled, with renal complications Deteriorated, non compliant behavior, states unable to tolerate janumet due to diarheah and has not been taking this. Now ready to go to endocrinology as he "wants to live" Urgent referral done, hopefully he will be seen within the next 1 week Needs insulin and has not been started on this today  Medical non-compliance Ongoing problem as far as diabetes is concerned in particular, however, now ready to get the care of an endocrinologist which I have recommended in the past year  HYPERLIPIDEMIA Needs statin therapy due to increased CV risk , will address at next visit  HYPERTENSION Uncontrolled and not at goal, inc cozaar to 118m daily DASH diet and commitment to daily physical activity for a minimum of 30 minutes discussed and encouraged, as a part of hypertension management. The importance of attaining a healthy weight is also discussed.   Seasonal allergies Increased symptoms in the Spring, start flonase

## 2014-01-21 DIAGNOSIS — E785 Hyperlipidemia, unspecified: Secondary | ICD-10-CM | POA: Diagnosis not present

## 2014-01-21 DIAGNOSIS — IMO0001 Reserved for inherently not codable concepts without codable children: Secondary | ICD-10-CM | POA: Diagnosis not present

## 2014-01-21 DIAGNOSIS — I10 Essential (primary) hypertension: Secondary | ICD-10-CM | POA: Diagnosis not present

## 2014-01-25 ENCOUNTER — Other Ambulatory Visit: Payer: Self-pay | Admitting: Family Medicine

## 2014-01-28 DIAGNOSIS — E785 Hyperlipidemia, unspecified: Secondary | ICD-10-CM | POA: Diagnosis not present

## 2014-01-28 DIAGNOSIS — IMO0001 Reserved for inherently not codable concepts without codable children: Secondary | ICD-10-CM | POA: Diagnosis not present

## 2014-01-28 DIAGNOSIS — I10 Essential (primary) hypertension: Secondary | ICD-10-CM | POA: Diagnosis not present

## 2014-02-07 ENCOUNTER — Telehealth: Payer: Self-pay

## 2014-02-07 NOTE — Telephone Encounter (Signed)
Pt was referred by Dr. Moshe Cipro for screening colonoscopy. His last one was 11/25/2007 and he had a tubular adenoma.  Tried to call pt to schedule OV prior to TCS.  LMOM at home and mobile for a return call.

## 2014-02-16 NOTE — Telephone Encounter (Signed)
LMOM at home for a return call.

## 2014-02-17 NOTE — Telephone Encounter (Signed)
Pt returned call and was scheduled OV with Laban Emperor, NP on 03/23/2014 at 11:00 AM.

## 2014-02-25 DIAGNOSIS — E785 Hyperlipidemia, unspecified: Secondary | ICD-10-CM | POA: Diagnosis not present

## 2014-02-25 DIAGNOSIS — I10 Essential (primary) hypertension: Secondary | ICD-10-CM | POA: Diagnosis not present

## 2014-02-25 DIAGNOSIS — IMO0001 Reserved for inherently not codable concepts without codable children: Secondary | ICD-10-CM | POA: Diagnosis not present

## 2014-03-23 ENCOUNTER — Ambulatory Visit: Payer: Medicare Other | Admitting: Gastroenterology

## 2014-04-13 DIAGNOSIS — I10 Essential (primary) hypertension: Secondary | ICD-10-CM | POA: Diagnosis not present

## 2014-04-13 DIAGNOSIS — E785 Hyperlipidemia, unspecified: Secondary | ICD-10-CM | POA: Diagnosis not present

## 2014-04-13 DIAGNOSIS — IMO0001 Reserved for inherently not codable concepts without codable children: Secondary | ICD-10-CM | POA: Diagnosis not present

## 2014-04-14 ENCOUNTER — Ambulatory Visit (INDEPENDENT_AMBULATORY_CARE_PROVIDER_SITE_OTHER): Payer: Medicare Other | Admitting: Otolaryngology

## 2014-04-29 DIAGNOSIS — I10 Essential (primary) hypertension: Secondary | ICD-10-CM | POA: Diagnosis not present

## 2014-04-29 DIAGNOSIS — Z91199 Patient's noncompliance with other medical treatment and regimen due to unspecified reason: Secondary | ICD-10-CM | POA: Diagnosis not present

## 2014-04-29 DIAGNOSIS — IMO0001 Reserved for inherently not codable concepts without codable children: Secondary | ICD-10-CM | POA: Diagnosis not present

## 2014-04-29 DIAGNOSIS — E785 Hyperlipidemia, unspecified: Secondary | ICD-10-CM | POA: Diagnosis not present

## 2014-04-29 DIAGNOSIS — Z9119 Patient's noncompliance with other medical treatment and regimen: Secondary | ICD-10-CM | POA: Diagnosis not present

## 2014-05-23 ENCOUNTER — Other Ambulatory Visit: Payer: Self-pay | Admitting: Family Medicine

## 2014-05-27 LAB — HEMOGLOBIN A1C: Hemoglobin-A1c: 10.3

## 2014-06-14 ENCOUNTER — Ambulatory Visit (INDEPENDENT_AMBULATORY_CARE_PROVIDER_SITE_OTHER): Payer: Medicare Other | Admitting: Family Medicine

## 2014-06-14 ENCOUNTER — Encounter: Payer: Self-pay | Admitting: Family Medicine

## 2014-06-14 ENCOUNTER — Encounter (INDEPENDENT_AMBULATORY_CARE_PROVIDER_SITE_OTHER): Payer: Self-pay

## 2014-06-14 VITALS — BP 138/80 | HR 78 | Resp 18 | Ht 75.0 in | Wt 223.1 lb

## 2014-06-14 DIAGNOSIS — E1165 Type 2 diabetes mellitus with hyperglycemia: Secondary | ICD-10-CM | POA: Diagnosis not present

## 2014-06-14 DIAGNOSIS — Z23 Encounter for immunization: Secondary | ICD-10-CM | POA: Diagnosis not present

## 2014-06-14 DIAGNOSIS — IMO0002 Reserved for concepts with insufficient information to code with codable children: Secondary | ICD-10-CM

## 2014-06-14 DIAGNOSIS — E1129 Type 2 diabetes mellitus with other diabetic kidney complication: Secondary | ICD-10-CM | POA: Diagnosis not present

## 2014-06-14 DIAGNOSIS — I10 Essential (primary) hypertension: Secondary | ICD-10-CM | POA: Diagnosis not present

## 2014-06-14 DIAGNOSIS — E785 Hyperlipidemia, unspecified: Secondary | ICD-10-CM | POA: Diagnosis not present

## 2014-06-14 NOTE — Patient Instructions (Addendum)
Annual wellness in  December, call if you need me before  Prevnar today  Cut back on salt, increase green vegetables, and commit to 30 mins daily of exercise to help blood suga r and blood pressure  [facility]  [facility]s bring labs you had at the [place] recently as soon as possible so we can have a copy in this record  Pls go by the gastroenterologist office  Or call to reschedule your appointment. You DO  NEED to be seen and re checked for colon cancer screening, this is very important  You will be referred to Dr Gershon Crane for eye exam  Based on foot exam you DO qualify for diabetic shoes[facility]  Thankful that blood sugar is improving, YOU  CAN DO THIS!

## 2014-06-14 NOTE — Progress Notes (Signed)
   Subjective:    Patient ID: [name], male    DOB: 10-23-1946, 68 y.o.   MRN: [id number]  HPI The PT is here for follow up and re-evaluation of chronic medical conditions, medication management and review of any available recent lab and radiology data.  Preventive health is updated, specifically  Cancer screening and Immunization.   [facility] which has made his medications that he needs affordable . Will bring in recent labs drawn 06/03/2014 The PT denies any adverse reactions to current medications since the last visit. States blood sugars have improved, still higher than should be, but he is finally now able to, and is testing his sugars as he should. Also involved in diabetic class with is local [facility]  [facility]. Admits to feeling better now than he did when sugars were less controlled There are no new concerns.  There are no specific complaints       Review of Systems See HPI Denies recent fever or chills. Denies sinus pressure, nasal congestion, ear pain or sore throat. Denies chest congestion, productive cough or wheezing. Denies chest pains, palpitations and leg swelling Denies abdominal pain, nausea, vomiting,diarrhea or constipation.   Denies dysuria, frequency, hesitancy or incontinence. Denies joint pain, swelling and limitation in mobility. Denies headaches, seizures, numbness, or tingling. Denies depression, anxiety or insomnia.       Objective:   Physical Exam BP 138/80  Pulse 78  Resp 18  Ht _0  (1.905 m)  Wt 223 lb 1.9 oz (101.207 kg)  BMI 27.89 kg/m2  SpO2 91% Patient alert and oriented and in no cardiopulmonary distress.  HEENT: No facial asymmetry, EOMI,   oropharynx pink and moist.  Neck supple no JVD, no mass.  Chest: Clear to auscultation bilaterally.  CVS: S1, S2 no murmurs, no S3.Regular rate.  ABD: Soft non tender.   Ext: No edema  MS: Adequate ROM spine, shoulders, hips and  knees.  Skin: Intact,tinea pedis and onychomycosis Psych: Good eye contact, normal affect. Memory intact not anxious or depressed appearing.  CNS: CN 2-12 intact, power,  normal throughout.no focal deficits noted.Decfrreased sensation in feet bilaterally        Assessment & Plan:  DM (diabetes mellitus), type 2, uncontrolled, with renal complications Improved, though still uncontrolled Applauded on current success, he will continue same I believe  HYPERLIPIDEMIA Controlled, no change in medication Hyperlipidemia:Low fat diet discussed and encouraged.  \  HYPERTENSION Controlled, no change in medication DASH diet and commitment to daily physical activity for a minimum of 30 minutes discussed and encouraged, as a part of hypertension management. The importance of attaining a healthy weight is also discussed.   Need for vaccination with 13-polyvalent pneumococcal conjugate vaccine Vaccine administered at visit

## 2014-06-18 ENCOUNTER — Telehealth: Payer: Self-pay | Admitting: Family Medicine

## 2014-06-18 DIAGNOSIS — Z23 Encounter for immunization: Secondary | ICD-10-CM | POA: Insufficient documentation

## 2014-06-18 NOTE — Assessment & Plan Note (Signed)
Improved, though still uncontrolled Applauded on current success, he will continue same I believe

## 2014-06-18 NOTE — Assessment & Plan Note (Signed)
Vaccine administered at visit.  

## 2014-06-18 NOTE — Assessment & Plan Note (Signed)
Controlled, no change in medication DASH diet and commitment to daily physical activity for a minimum of 30 minutes discussed and encouraged, as a part of hypertension management. The importance of attaining a healthy weight is also discussed.  

## 2014-06-18 NOTE — Telephone Encounter (Signed)
pls let pt know labs look very good except fpor the blood sugar which has improved, which is gREAt. Cholesterol, liver function, blood count PSA, and kidney function all loofk good, just needs to get blood sugar in line, which I believe that he will  Pls also enter LDL value 82.4 and HBa1C  of  9.2 from [place] lab drawn 06/03/2014

## 2014-06-18 NOTE — Assessment & Plan Note (Signed)
Controlled, no change in medication Hyperlipidemia:Low fat diet discussed and encouraged.  \

## 2014-06-20 LAB — LDL CHOLESTEROL, DIRECT
HEMOGLOBIN-A1C: 9.2
LDL Cholesterol: 82 mg/dL

## 2014-06-20 NOTE — Telephone Encounter (Signed)
Message left for patient

## 2014-07-08 LAB — HM DIABETES EYE EXAM

## 2014-07-12 DIAGNOSIS — E119 Type 2 diabetes mellitus without complications: Secondary | ICD-10-CM | POA: Diagnosis not present

## 2014-07-12 DIAGNOSIS — H2510 Age-related nuclear cataract, unspecified eye: Secondary | ICD-10-CM | POA: Diagnosis not present

## 2014-07-12 LAB — HM DIABETES EYE EXAM

## 2014-07-14 ENCOUNTER — Other Ambulatory Visit: Payer: Self-pay | Admitting: Family Medicine

## 2014-07-29 DIAGNOSIS — IMO0001 Reserved for inherently not codable concepts without codable children: Secondary | ICD-10-CM | POA: Diagnosis not present

## 2014-07-29 DIAGNOSIS — I10 Essential (primary) hypertension: Secondary | ICD-10-CM | POA: Diagnosis not present

## 2014-07-29 DIAGNOSIS — E785 Hyperlipidemia, unspecified: Secondary | ICD-10-CM | POA: Diagnosis not present

## 2014-08-02 ENCOUNTER — Encounter: Payer: Self-pay | Admitting: *Deleted

## 2014-08-02 DIAGNOSIS — IMO0001 Reserved for inherently not codable concepts without codable children: Secondary | ICD-10-CM | POA: Diagnosis not present

## 2014-08-02 DIAGNOSIS — Z91199 Patient's noncompliance with other medical treatment and regimen due to unspecified reason: Secondary | ICD-10-CM | POA: Diagnosis not present

## 2014-08-02 DIAGNOSIS — E785 Hyperlipidemia, unspecified: Secondary | ICD-10-CM | POA: Diagnosis not present

## 2014-08-02 DIAGNOSIS — I10 Essential (primary) hypertension: Secondary | ICD-10-CM | POA: Diagnosis not present

## 2014-08-02 DIAGNOSIS — Z9119 Patient's noncompliance with other medical treatment and regimen: Secondary | ICD-10-CM | POA: Diagnosis not present

## 2014-08-02 LAB — HEMOGLOBIN A1C: A1c: 10.3

## 2014-08-15 LAB — HEMOGLOBIN A1C: A1c: 9.7

## 2014-10-24 DIAGNOSIS — I10 Essential (primary) hypertension: Secondary | ICD-10-CM | POA: Diagnosis not present

## 2014-10-24 DIAGNOSIS — E119 Type 2 diabetes mellitus without complications: Secondary | ICD-10-CM | POA: Diagnosis not present

## 2014-10-24 DIAGNOSIS — E785 Hyperlipidemia, unspecified: Secondary | ICD-10-CM | POA: Diagnosis not present

## 2014-10-24 DIAGNOSIS — E559 Vitamin D deficiency, unspecified: Secondary | ICD-10-CM | POA: Diagnosis not present

## 2014-11-01 DIAGNOSIS — E1165 Type 2 diabetes mellitus with hyperglycemia: Secondary | ICD-10-CM | POA: Diagnosis not present

## 2014-11-01 DIAGNOSIS — I10 Essential (primary) hypertension: Secondary | ICD-10-CM | POA: Diagnosis not present

## 2014-11-01 DIAGNOSIS — E559 Vitamin D deficiency, unspecified: Secondary | ICD-10-CM | POA: Diagnosis not present

## 2014-11-01 DIAGNOSIS — E782 Mixed hyperlipidemia: Secondary | ICD-10-CM | POA: Diagnosis not present

## 2014-11-08 ENCOUNTER — Encounter: Payer: Medicare Other | Admitting: Family Medicine

## 2014-11-10 LAB — HEMOGLOBIN A1C: A1c: 8.5

## 2014-12-04 ENCOUNTER — Other Ambulatory Visit: Payer: Self-pay | Admitting: Family Medicine

## 2014-12-06 ENCOUNTER — Emergency Department (HOSPITAL_COMMUNITY): Payer: Medicare Other

## 2014-12-06 ENCOUNTER — Encounter: Payer: Self-pay | Admitting: Family Medicine

## 2014-12-06 ENCOUNTER — Encounter (HOSPITAL_COMMUNITY): Payer: Self-pay | Admitting: Emergency Medicine

## 2014-12-06 ENCOUNTER — Ambulatory Visit (INDEPENDENT_AMBULATORY_CARE_PROVIDER_SITE_OTHER): Payer: Medicare Other | Admitting: Family Medicine

## 2014-12-06 ENCOUNTER — Observation Stay (HOSPITAL_COMMUNITY)
Admission: EM | Admit: 2014-12-06 | Discharge: 2014-12-07 | Disposition: A | Discharge status: Home / Self Care | Payer: Medicare Other | Attending: Internal Medicine | Admitting: Internal Medicine

## 2014-12-06 VITALS — BP 178/90 | HR 78 | Resp 16 | Ht 75.0 in | Wt 229.0 lb

## 2014-12-06 DIAGNOSIS — Z87891 Personal history of nicotine dependence: Secondary | ICD-10-CM | POA: Diagnosis not present

## 2014-12-06 DIAGNOSIS — H547 Unspecified visual loss: Secondary | ICD-10-CM

## 2014-12-06 DIAGNOSIS — Z79899 Other long term (current) drug therapy: Secondary | ICD-10-CM | POA: Insufficient documentation

## 2014-12-06 DIAGNOSIS — E1169 Type 2 diabetes mellitus with other specified complication: Secondary | ICD-10-CM | POA: Diagnosis present

## 2014-12-06 DIAGNOSIS — I10 Essential (primary) hypertension: Secondary | ICD-10-CM | POA: Diagnosis not present

## 2014-12-06 DIAGNOSIS — I739 Peripheral vascular disease, unspecified: Secondary | ICD-10-CM | POA: Diagnosis not present

## 2014-12-06 DIAGNOSIS — Z794 Long term (current) use of insulin: Secondary | ICD-10-CM | POA: Diagnosis not present

## 2014-12-06 DIAGNOSIS — I517 Cardiomegaly: Secondary | ICD-10-CM | POA: Diagnosis not present

## 2014-12-06 DIAGNOSIS — E785 Hyperlipidemia, unspecified: Secondary | ICD-10-CM | POA: Diagnosis not present

## 2014-12-06 DIAGNOSIS — E119 Type 2 diabetes mellitus without complications: Secondary | ICD-10-CM | POA: Diagnosis not present

## 2014-12-06 DIAGNOSIS — R0600 Dyspnea, unspecified: Secondary | ICD-10-CM | POA: Diagnosis not present

## 2014-12-06 DIAGNOSIS — Z87442 Personal history of urinary calculi: Secondary | ICD-10-CM | POA: Insufficient documentation

## 2014-12-06 DIAGNOSIS — N529 Male erectile dysfunction, unspecified: Secondary | ICD-10-CM | POA: Insufficient documentation

## 2014-12-06 DIAGNOSIS — J441 Chronic obstructive pulmonary disease with (acute) exacerbation: Secondary | ICD-10-CM | POA: Diagnosis not present

## 2014-12-06 DIAGNOSIS — R0609 Other forms of dyspnea: Secondary | ICD-10-CM | POA: Diagnosis present

## 2014-12-06 DIAGNOSIS — E1129 Type 2 diabetes mellitus with other diabetic kidney complication: Secondary | ICD-10-CM | POA: Diagnosis not present

## 2014-12-06 DIAGNOSIS — R590 Localized enlarged lymph nodes: Secondary | ICD-10-CM | POA: Diagnosis present

## 2014-12-06 DIAGNOSIS — Z0000 Encounter for general adult medical examination without abnormal findings: Secondary | ICD-10-CM | POA: Diagnosis not present

## 2014-12-06 DIAGNOSIS — E1165 Type 2 diabetes mellitus with hyperglycemia: Secondary | ICD-10-CM

## 2014-12-06 DIAGNOSIS — Z7951 Long term (current) use of inhaled steroids: Secondary | ICD-10-CM | POA: Diagnosis not present

## 2014-12-06 DIAGNOSIS — R0902 Hypoxemia: Secondary | ICD-10-CM | POA: Diagnosis not present

## 2014-12-06 DIAGNOSIS — R0602 Shortness of breath: Secondary | ICD-10-CM | POA: Diagnosis present

## 2014-12-06 HISTORY — DX: Dyspnea, unspecified: R06.00

## 2014-12-06 HISTORY — DX: Chronic obstructive pulmonary disease, unspecified: J44.9

## 2014-12-06 HISTORY — DX: Other forms of dyspnea: R06.09

## 2014-12-06 HISTORY — DX: Hypoxemia: R09.02

## 2014-12-06 LAB — COMPREHENSIVE METABOLIC PANEL
ALK PHOS: 49 U/L (ref 39–117)
ALT: 22 U/L (ref 0–53)
AST: 22 U/L (ref 0–37)
Albumin: 4 g/dL (ref 3.5–5.2)
Anion gap: 7 (ref 5–15)
BUN: 18 mg/dL (ref 6–23)
CALCIUM: 9.3 mg/dL (ref 8.4–10.5)
CO2: 25 mmol/L (ref 19–32)
CREATININE: 1.18 mg/dL (ref 0.50–1.35)
Chloride: 109 mmol/L (ref 96–112)
GFR calc non Af Amer: 61 mL/min — ABNORMAL LOW (ref >90)
GFR, EST AFRICAN AMERICAN: 71 mL/min — AB (ref >90)
Glucose, Bld: 132 mg/dL — ABNORMAL HIGH (ref 70–99)
Potassium: 3.9 mmol/L (ref 3.5–5.1)
Sodium: 141 mmol/L (ref 135–145)
TOTAL PROTEIN: 7.7 g/dL (ref 6.0–8.3)
Total Bilirubin: 0.6 mg/dL (ref 0.3–1.2)

## 2014-12-06 LAB — BLOOD GAS, ARTERIAL
Acid-base deficit: 1.3 mmol/L (ref 0.0–2.0)
BICARBONATE: 22.8 meq/L (ref 20.0–24.0)
Drawn by: 234301
O2 Content: 2 L/min
O2 Saturation: 92.5 %
PATIENT TEMPERATURE: 37
PCO2 ART: 37.7 mmHg (ref 35.0–45.0)
TCO2: 19.3 mmol/L (ref 0–100)
pH, Arterial: 7.4 (ref 7.350–7.450)
pO2, Arterial: 68 mmHg — ABNORMAL LOW (ref 80.0–100.0)

## 2014-12-06 LAB — GLUCOSE, CAPILLARY
GLUCOSE-CAPILLARY: 200 mg/dL — AB (ref 70–99)
Glucose-Capillary: 200 mg/dL — ABNORMAL HIGH (ref 70–99)

## 2014-12-06 LAB — CBC WITH DIFFERENTIAL/PLATELET
BASOS ABS: 0 10*3/uL (ref 0.0–0.1)
Basophils Relative: 0 % (ref 0–1)
EOS ABS: 0.1 10*3/uL (ref 0.0–0.7)
Eosinophils Relative: 1 % (ref 0–5)
HCT: 52.1 % — ABNORMAL HIGH (ref 39.0–52.0)
Hemoglobin: 16.9 g/dL (ref 13.0–17.0)
LYMPHS ABS: 2 10*3/uL (ref 0.7–4.0)
Lymphocytes Relative: 34 % (ref 12–46)
MCH: 27.2 pg (ref 26.0–34.0)
MCHC: 32.4 g/dL (ref 30.0–36.0)
MCV: 83.8 fL (ref 78.0–100.0)
Monocytes Absolute: 0.4 10*3/uL (ref 0.1–1.0)
Monocytes Relative: 7 % (ref 3–12)
NEUTROS ABS: 3.4 10*3/uL (ref 1.7–7.7)
Neutrophils Relative %: 58 % (ref 43–77)
Platelets: 210 10*3/uL (ref 150–400)
RBC: 6.22 MIL/uL — ABNORMAL HIGH (ref 4.22–5.81)
RDW: 17.7 % — ABNORMAL HIGH (ref 11.5–15.5)
WBC: 5.9 10*3/uL (ref 4.0–10.5)

## 2014-12-06 LAB — TROPONIN I: Troponin I: <0.03 ng/mL (ref <0.031)

## 2014-12-06 LAB — BRAIN NATRIURETIC PEPTIDE: B Natriuretic Peptide: 42 pg/mL (ref 0.0–100.0)

## 2014-12-06 MED ORDER — LOSARTAN POTASSIUM 50 MG PO TABS
100.0000 mg | ORAL_TABLET | Freq: Every day | ORAL | Status: DC
  Administered 2014-12-07: 100 mg via ORAL
  Filled 2014-12-06: qty 2

## 2014-12-06 MED ORDER — INSULIN DETEMIR 100 UNIT/ML ~~LOC~~ SOLN
25.0000 [IU] | Freq: Every day | SUBCUTANEOUS | Status: DC
  Administered 2014-12-06: 25 [IU] via SUBCUTANEOUS
  Filled 2014-12-06 (×2): qty 0.25

## 2014-12-06 MED ORDER — INSULIN ASPART 100 UNIT/ML ~~LOC~~ SOLN: 0.0000 [IU] | Freq: Every day | SUBCUTANEOUS | Status: DC

## 2014-12-06 MED ORDER — ONDANSETRON HCL 4 MG/2ML IJ SOLN: 4.0000 mg | Freq: Four times a day (QID) | INTRAMUSCULAR | Status: DC | PRN

## 2014-12-06 MED ORDER — TRAZODONE HCL 50 MG PO TABS: 25.0000 mg | ORAL_TABLET | Freq: Every evening | ORAL | Status: DC | PRN

## 2014-12-06 MED ORDER — INSULIN ASPART 100 UNIT/ML ~~LOC~~ SOLN
3.0000 [IU] | Freq: Three times a day (TID) | SUBCUTANEOUS | Status: DC
  Administered 2014-12-06 – 2014-12-07 (×3): 3 [IU] via SUBCUTANEOUS

## 2014-12-06 MED ORDER — MORPHINE SULFATE 2 MG/ML IJ SOLN: 1.0000 mg | INTRAMUSCULAR | Status: DC | PRN

## 2014-12-06 MED ORDER — ACETAMINOPHEN 325 MG PO TABS: 650.0000 mg | ORAL_TABLET | Freq: Four times a day (QID) | ORAL | Status: DC | PRN

## 2014-12-06 MED ORDER — IPRATROPIUM-ALBUTEROL 0.5-2.5 (3) MG/3ML IN SOLN
3.0000 mL | Freq: Once | RESPIRATORY_TRACT | Status: AC
  Administered 2014-12-06: 3 mL via RESPIRATORY_TRACT
  Filled 2014-12-06: qty 3

## 2014-12-06 MED ORDER — ALBUTEROL SULFATE (2.5 MG/3ML) 0.083% IN NEBU
2.5000 mg | INHALATION_SOLUTION | Freq: Four times a day (QID) | RESPIRATORY_TRACT | Status: DC
  Administered 2014-12-06 (×2): 2.5 mg via RESPIRATORY_TRACT
  Filled 2014-12-06 (×2): qty 3

## 2014-12-06 MED ORDER — ALBUTEROL SULFATE (2.5 MG/3ML) 0.083% IN NEBU: 2.5000 mg | INHALATION_SOLUTION | RESPIRATORY_TRACT | Status: DC | PRN

## 2014-12-06 MED ORDER — SODIUM CHLORIDE 0.9 % IV SOLN: 250.0000 mL | INTRAVENOUS | Status: DC | PRN

## 2014-12-06 MED ORDER — SODIUM CHLORIDE 0.9 % IJ SOLN
3.0000 mL | Freq: Two times a day (BID) | INTRAMUSCULAR | Status: DC
  Administered 2014-12-06 – 2014-12-07 (×2): 3 mL via INTRAVENOUS

## 2014-12-06 MED ORDER — INSULIN ASPART 100 UNIT/ML ~~LOC~~ SOLN
0.0000 [IU] | Freq: Three times a day (TID) | SUBCUTANEOUS | Status: DC
  Administered 2014-12-06: 3 [IU] via SUBCUTANEOUS
  Administered 2014-12-07: 5 [IU] via SUBCUTANEOUS
  Administered 2014-12-07: 3 [IU] via SUBCUTANEOUS

## 2014-12-06 MED ORDER — PRAVASTATIN SODIUM 40 MG PO TABS
40.0000 mg | ORAL_TABLET | Freq: Every day | ORAL | Status: DC
  Administered 2014-12-07: 40 mg via ORAL
  Filled 2014-12-06: qty 1

## 2014-12-06 MED ORDER — IOHEXOL 350 MG/ML SOLN
100.0000 mL | Freq: Once | INTRAVENOUS | Status: AC | PRN
  Administered 2014-12-06: 100 mL via INTRAVENOUS

## 2014-12-06 MED ORDER — ALUM & MAG HYDROXIDE-SIMETH 200-200-20 MG/5ML PO SUSP
30.0000 mL | Freq: Four times a day (QID) | ORAL | Status: DC | PRN
Start: 2014-12-06 — End: 2014-12-07

## 2014-12-06 MED ORDER — ACETAMINOPHEN 650 MG RE SUPP: 650.0000 mg | Freq: Four times a day (QID) | RECTAL | Status: DC | PRN

## 2014-12-06 MED ORDER — SODIUM CHLORIDE 0.9 % IJ SOLN: 3.0000 mL | INTRAMUSCULAR | Status: DC | PRN

## 2014-12-06 MED ORDER — SODIUM CHLORIDE 0.9 % IJ SOLN
3.0000 mL | Freq: Two times a day (BID) | INTRAMUSCULAR | Status: DC
  Administered 2014-12-06 (×2): 3 mL via INTRAVENOUS

## 2014-12-06 MED ORDER — BISACODYL 5 MG PO TBEC: 5.0000 mg | DELAYED_RELEASE_TABLET | Freq: Every day | ORAL | Status: DC | PRN

## 2014-12-06 MED ORDER — ENOXAPARIN SODIUM 40 MG/0.4ML ~~LOC~~ SOLN
40.0000 mg | Freq: Every day | SUBCUTANEOUS | Status: DC
  Administered 2014-12-06 – 2014-12-07 (×2): 40 mg via SUBCUTANEOUS
  Filled 2014-12-06 (×2): qty 0.4

## 2014-12-06 MED ORDER — FLUTICASONE PROPIONATE 50 MCG/ACT NA SUSP
2.0000 | Freq: Every day | NASAL | Status: DC
  Administered 2014-12-07: 2 via NASAL
  Filled 2014-12-06: qty 16

## 2014-12-06 MED ORDER — ALBUTEROL SULFATE (2.5 MG/3ML) 0.083% IN NEBU
2.5000 mg | INHALATION_SOLUTION | Freq: Four times a day (QID) | RESPIRATORY_TRACT | Status: DC
  Administered 2014-12-07 (×2): 2.5 mg via RESPIRATORY_TRACT
  Filled 2014-12-06 (×2): qty 3

## 2014-12-06 MED ORDER — HYDROCODONE-ACETAMINOPHEN 5-325 MG PO TABS: 1.0000 | ORAL_TABLET | ORAL | Status: DC | PRN

## 2014-12-06 MED ORDER — ONDANSETRON HCL 4 MG PO TABS: 4.0000 mg | ORAL_TABLET | Freq: Four times a day (QID) | ORAL | Status: DC | PRN

## 2014-12-06 NOTE — Patient Instructions (Addendum)
F/u in 4 weeks, call if you need me before  You need to go directly to the ED, I will contact the MD    Vision needs to be watched closely

## 2014-12-06 NOTE — Assessment & Plan Note (Signed)

## 2014-12-06 NOTE — Assessment & Plan Note (Signed)
3 week h/o acute onset , seemingly , of exertional dypnea, tiring easily after minimal activity, which is far from his baseline. Oxygen level was in the 80's when he first checked in Needs ED evaluation today, pt left the office directly to the Ed, and Doc in Ed made aware of his condition prior to his arrival

## 2014-12-06 NOTE — Progress Notes (Signed)
  Echocardiogram 2D Echocardiogram has been performed.  [place], [place] 12/06/2014, 3:36 PM

## 2014-12-06 NOTE — Progress Notes (Signed)
Subjective:    Patient ID: [name], male    DOB: November 11, 1946, 69 y.o.   MRN: [id number]  HPI  Preventive Screening-Counseling & Management   Patient present here today for a Medicare annual wellness visit.   Current Problems (verified)   Medications Prior to Visit Allergies (verified)   PAST HISTORY  Family History (verified)   Social History married for 20 years, 3 children, retired from [employer]   Risk Factors  Current exercise habits:  Did exercise up until a month ago when he started getting SOB   Dietary issues discussed: heart healthy diet discussed, eat more fruits and vegetables, limit fried foods and carbs since he is diabetic.    Cardiac risk factors: dm   Depression Screen  (Note: if answer to either of the following is "Yes", a more complete depression screening is indicated)   Over the past two weeks, have you felt down, depressed or hopeless? No  Over the past two weeks, have you felt little interest or pleasure in doing things? No  Have you lost interest or pleasure in daily life? No  Do you often feel hopeless? No  Do you cry easily over simple problems? No   Activities of Daily Living  In your present state of health, do you have any difficulty performing the following activities?  Driving?: No Managing money?: No Feeding yourself?:No Getting from bed to chair?:No Climbing a flight of stairs?:yes, he has been getting very SOB recently so now that would be a challenge for him  Preparing food and eating?:No Bathing or showering?:No Getting dressed?:No Getting to the toilet?:No Using the toilet?:No Moving around from place to place?: No  Fall Risk Assessment In the past year have you fallen or had a near fall?:No Are you currently taking any medications that make you dizzy?:No   Hearing Difficulties: No Do you often ask people to speak up or repeat themselves?:No Do you experience ringing or noises in your ears?:No Do you have  difficulty understanding soft or whispered voices?:No  Cognitive Testing  Alert? Yes Normal Appearance?Yes  Oriented to person? Yes Place? Yes  Time? Yes  Displays appropriate judgment?Yes  Can read the correct time from a watch face? yes Are you having problems remembering things?No  Advanced Directives have been discussed with the patient?Yes, no living will, brochure given    List the Names of Other Physician/Practitioners you currently use:  Dr Dorris Fetch ([facility])   Indicate any recent Medical Services you may have received from other than [facility] providers in the past year (date may be approximate).   Assessment:    Annual Wellness Exam   Plan:      Medicare Attestation  I have personally reviewed:  The patient's medical and social history  Their use of alcohol, tobacco or illicit drugs  Their current medications and supplements  The patient's functional ability including ADLs,fall risks, home safety risks, cognitive, and hearing and visual impairment  Diet and physical activities  Evidence for depression or mood disorders  The patient's weight, height, BMI, and visual acuity have been recorded in the chart. I have made referrals, counseling, and provided education to the patient based on review of the above and I have provided the patient with a written personalized care plan for preventive services.     Review of Systems     Objective:   Physical Exam  BP 178/90 mmHg  Pulse 78  Resp 16  Ht _0  (1.905 m)  Wt 229 lb (103.874  kg)  BMI 28.62 kg/m2  SpO2 92%       Assessment & Plan:  Medicare annual wellness visit, subsequent Annual exam as documented. Counseling done  re healthy lifestyle involving commitment to 150 minutes exercise per week, heart healthy diet, and attaining healthy weight.The importance of adequate sleep also discussed. Regular seat belt use and home safety, is also discussed. Changes in health habits are decided on by the patient with goals  and time frames  set for achieving them. Immunization and cancer screening needs are specifically addressed at this visit.    Poor vision Importance of annual eye exam is stressed. Decreased acuity in right eye 20/70   Dyspnea 3 week h/o acute onset , seemingly , of exertional dypnea, tiring easily after minimal activity, which is far from his baseline. Oxygen level was in the 80's when he first checked in Needs ED evaluation today, pt left the office directly to the Ed, and Doc in Ed made aware of his condition prior to his arrival

## 2014-12-06 NOTE — ED Provider Notes (Signed)
CSN: [id number]     Arrival date & time 12/06/14  0847 History   First MD Initiated Contact with Patient 12/06/14 [phone]     Chief Complaint  Patient presents with  . Shortness of Breath      HPI Pt was seen at 0920. Per pt, c/o gradual onset and worsening of persistent SOB for the past 3 weeks. SOB worsens on exertion. Pt was evaluated by his PMD PTA, note to have O2 Sat 84% R/A, and then was sent to the ED for further evaluation. Denies CP/palpitations, no cough, no back pain, no abd pain, no N/V/D, no calf/LE pain or unilateral swelling.    Past Medical History  Diagnosis Date  . Hyperlipidemia   . Hypertension   . ED (erectile dysfunction)   . PVD (peripheral vascular disease)   . Carotid bruit   . History of renal calculi   . Diabetes mellitus without complication   . DOE (dyspnea on exertion)     2016  . Hypoxia     2016   Past Surgical History  Procedure Laterality Date  . Left wrist    . Rotator cuff repair  1995    right   . Lithotripsy  2010  . 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   Family History  Problem Relation Age of Onset  . Diabetes Sister    History  Substance Use Topics  . Smoking status: Former Smoker -- 0.50 packs/day    Quit date: 02/09/2013  . Smokeless tobacco: Not on file  . Alcohol Use: No    Review of Systems ROS: Statement: All systems negative except as marked or noted in the HPI; Constitutional: Negative for fever and chills. ; ; Eyes: Negative for eye pain, redness and discharge. ; ; ENMT: Negative for ear pain, hoarseness, nasal congestion, sinus pressure and sore throat. ; ; Cardiovascular: Negative for chest pain, palpitations, diaphoresis, and peripheral edema. ; ; Respiratory: +dyspnea. Negative for cough, wheezing and stridor. ; ; Gastrointestinal: Negative for nausea, vomiting, diarrhea, abdominal pain, blood in  stool, hematemesis, jaundice and rectal bleeding. . ; ; Genitourinary: Negative for dysuria, flank pain and hematuria. ; ; Musculoskeletal: Negative for back pain and neck pain. Negative for swelling and trauma.; ; Skin: Negative for pruritus, rash, abrasions, blisters, bruising and skin lesion.; ; Neuro: Negative for headache, lightheadedness and neck stiffness. Negative for weakness, altered level of consciousness , altered mental status, extremity weakness, paresthesias, involuntary movement, seizure and syncope.        Allergies  Metformin and related  Home Medications   Prior to Admission medications   Medication Sig Start Date End Date Taking? Authorizing Provider  fluticasone (FLONASE) 50 MCG/ACT nasal spray Place 2 sprays into both nostrils daily. 01/17/14  Yes Fayrene Helper, MD  insulin aspart (NOVOLOG) 100 UNIT/ML injection Inject 8 Units into the skin 3 (three) times daily with meals.   Yes Historical Provider, MD  insulin detemir (LEVEMIR) 100 UNIT/ML injection Inject 50 Units into the skin at bedtime.   Yes Historical Provider, MD  losartan (COZAAR) 100 MG tablet Take 1 tablet (100 mg total) by mouth daily. 01/17/14  Yes Fayrene Helper, MD  pravastatin (PRAVACHOL) 40 MG tablet TAKE 1 TABLET BY MOUTH DAILY 12/05/14  Yes Fayrene Helper, MD  Vitamin D, Ergocalciferol, (DRISDOL) 50000 UNITS CAPS capsule Take 50,000 Units by mouth every 7 (seven) days. Tuesday  Yes Historical Provider, MD  ONE TOUCH ULTRA TEST test strip TEST BLOOD SUGAR 2 TIMES DAILY Patient not taking: Reported on 12/06/2014 07/15/14   Fayrene Helper, MD  [place] LANCETS [address] [place] TEST BLOOD SUGAR TWO TIMES DAILY Patient not taking: Reported on 12/06/2014    Fayrene Helper, MD   BP 174/95 mmHg  Pulse 74  Temp(Src) 97.6 F (36.4 C) (Oral)  Resp 17  Ht _0  (1.905 m)  Wt 229 lb (103.874 kg)  BMI 28.62 kg/m2  SpO2 96% Physical Exam  0925: Physical examination:  Nursing notes reviewed;  Vital signs and O2 SAT reviewed;  Constitutional: Well developed, Well nourished, Well hydrated, In no acute distress; Head:  Normocephalic, atraumatic; Eyes: EOMI, PERRL, No scleral icterus; ENMT: Mouth and pharynx normal, Mucous membranes moist; Neck: Supple, Full range of motion, No lymphadenopathy; Cardiovascular: Regular rate and rhythm, No gallop; Respiratory: Breath sounds clear & equal bilaterally, No wheezes.  Speaking full sentences with ease, Normal respiratory effort/excursion; Chest: Nontender, Movement normal; Abdomen: Soft, Nontender, Nondistended, Normal bowel sounds; Genitourinary: No CVA tenderness; Extremities: Pulses normal, No tenderness, No edema, No calf edema or asymmetry.; Neuro: AA&Ox3, Major CN grossly intact.  Speech clear. No gross focal motor or sensory deficits in extremities. Climbs on and off stretcher easily by himself. Gait steady.; Skin: Color normal, Warm, Dry.   ED Course  Procedures     EKG Interpretation   Date/Time:  Tuesday December 06 2014 08:58:12 EST Ventricular Rate:  86 PR Interval:  236 QRS Duration: 79 QT Interval:  392 QTC Calculation: 469 R Axis:   44 Text Interpretation:  Sinus rhythm with 1st degree A-V block When compared  with ECG of 01/18/2008 First degree A-V block is now Present Confirmed by  [facility]  MD, Nunzio Cory [phone]) on 12/06/2014 9:08:11 AM      MDM  MDM Reviewed: previous chart, nursing note and vitals Reviewed previous: labs and ECG Interpretation: labs, ECG, x-ray and CT scan     Results for orders placed or performed during the hospital encounter of 12/06/14  CBC with Differential  Result Value Ref Range   WBC 5.9 4.0 - 10.5 K/uL   RBC 6.22 (H) 4.22 - 5.81 MIL/uL   Hemoglobin 16.9 13.0 - 17.0 g/dL   HCT 52.1 (H) 39.0 - 52.0 %   MCV 83.8 78.0 - 100.0 fL   MCH 27.2 26.0 - 34.0 pg   MCHC 32.4 30.0 - 36.0 g/dL   RDW 17.7 (H) 11.5 - 15.5 %   Platelets 210 150 - 400 K/uL   Neutrophils Relative % 58 43 - 77 %    Neutro Abs 3.4 1.7 - 7.7 K/uL   Lymphocytes Relative 34 12 - 46 %   Lymphs Abs 2.0 0.7 - 4.0 K/uL   Monocytes Relative 7 3 - 12 %   Monocytes Absolute 0.4 0.1 - 1.0 K/uL   Eosinophils Relative 1 0 - 5 %   Eosinophils Absolute 0.1 0.0 - 0.7 K/uL   Basophils Relative 0 0 - 1 %   Basophils Absolute 0.0 0.0 - 0.1 K/uL  Comprehensive metabolic panel  Result Value Ref Range   Sodium 141 135 - 145 mmol/L   Potassium 3.9 3.5 - 5.1 mmol/L   Chloride 109 96 - 112 mmol/L   CO2 25 19 - 32 mmol/L   Glucose, Bld 132 (H) 70 - 99 mg/dL   BUN 18 6 - 23 mg/dL   Creatinine, Ser 1.18 0.50 - 1.35 mg/dL  Calcium 9.3 8.4 - 10.5 mg/dL   Total Protein 7.7 6.0 - 8.3 g/dL   Albumin 4.0 3.5 - 5.2 g/dL   AST 22 0 - 37 U/L   ALT 22 0 - 53 U/L   Alkaline Phosphatase 49 39 - 117 U/L   Total Bilirubin 0.6 0.3 - 1.2 mg/dL   GFR calc non Af Amer 61 (L) >90 mL/min   GFR calc Af Amer 71 (L) >90 mL/min   Anion gap 7 5 - 15  Brain natriuretic peptide  Result Value Ref Range   B Natriuretic Peptide 42.0 0.0 - 100.0 pg/mL  Troponin I  Result Value Ref Range   Troponin I <0.03 <0.031 ng/mL  Blood gas, arterial  Result Value Ref Range   O2 Content 2.0 L/min   Delivery systems NASAL CANNULA    pH, Arterial 7.400 7.350 - 7.450   pCO2 arterial 37.7 35.0 - 45.0 mmHg   pO2, Arterial 68.0 (L) 80.0 - 100.0 mmHg   Bicarbonate 22.8 20.0 - 24.0 mEq/L   TCO2 19.3 0 - 100 mmol/L   Acid-base deficit 1.3 0.0 - 2.0 mmol/L   O2 Saturation 92.5 %   Patient temperature 37.0    Collection site RIGHT RADIAL    Drawn by [id number]    Sample type ARTERIAL    Allens test (pass/fail) PASS PASS   Dg Chest 2 View 12/06/2014   CLINICAL DATA:  Shortness of breath for 3 weeks.  EXAM: CHEST  2 VIEW  COMPARISON:  June 20, 2009.  FINDINGS: The heart size and mediastinal contours are within normal limits. Both lungs are clear. No pneumothorax or pleural effusion is noted. The visualized skeletal structures are unremarkable.  IMPRESSION: No  acute cardiopulmonary abnormality seen.   Electronically Signed   By: Sabino Dick M.D.   On: 12/06/2014 09:57   Ct Angio Chest Pe W/cm &/or Wo Cm 12/06/2014   CLINICAL DATA:  Shortness of breath for 3 weeks, chest pain, history hyperlipidemia, hypertension, carotid bruit, diabetes mellitus, former smoker  EXAM: CT ANGIOGRAPHY CHEST WITH CONTRAST  TECHNIQUE: Multidetector CT imaging of the chest was performed using the standard protocol during bolus administration of intravenous contrast. Multiplanar CT image reconstructions and MIPs were obtained to evaluate the vascular anatomy.  CONTRAST:  175m OMNIPAQUE IOHEXOL 350 MG/ML SOLN total IV; technical failure of scanner after 100 cc of contrast, requiring reinjection of 80 cc and repeat imaging.  COMPARISON:  None  FINDINGS: Scattered atherosclerotic calcification aorta without aneurysm or dissection.  Pulmonary arteries well opacified and patent.  No evidence of pulmonary embolism.  Thoracic adenopathy involving mediastinum and BILATERAL hila.  RIGHT infrahilar lymph node 16 mm short axis image 58.  Subcarinal lymph node 17 mm short axis image 47.  LEFT paratracheal node at the level of the carina 15 mm short axis image 38.  RIGHT peritracheal lymph node 11 mm short axis image 29.  Additional mediastinal and BILATERAL hilar nodes also noted.  Visualized upper abdomen unremarkable.  Dependent atelectasis in both lower lobes.  Mild scattered peribronchial thickening.  No pulmonary infiltrate, pleural effusion or pneumothorax.  No pulmonary mass/ nodule identified.  Osseous structures unremarkable.  Review of the MIP images confirms the above findings.  IMPRESSION: No evidence of pulmonary embolism.  Dependent atelectasis in both lower lobes.  Nonspecific enlarged mediastinal and hilar lymph nodes as above.  Differential diagnosis includes reactive processes, calcium in disease, metastatic disease and lymphoma.  Consider short-term followup CT in 6-8 weeks to  reassess.   Electronically Signed   By: Lavonia Dana M.D.   On: 12/06/2014 11:24    1145:  Pt ambulated with Sats dropping to 84-86% R/A, and pt c/o increasing SOB and RR. O2 N/C placed as pt returned to stretcher with O2 Sats increasing to 92-95%. No clear cause for SOB on workup; will give neb treatment. Dx and testing d/w pt.  Questions answered.  Verb understanding, agreeable to admit. T/C to [facility]. Jerilee Hoh, case discussed, including:  HPI, pertinent PM/SHx, VS/PE, dx testing, ED course and treatment:  Agreeable to admit, requests to obtain ABG, write temporary orders, obtain tele bed to team 2.   Francine Graven, DO 12/10/14 [phone]

## 2014-12-06 NOTE — ED Notes (Signed)
Pt's RA sats 88-89%, O2 @ 2L/M via [place] increased to 95%

## 2014-12-06 NOTE — ED Notes (Signed)
Pt reports SOB x 3 weeks. Pt had Dr appt this am and was sent to ED to be checked.

## 2014-12-06 NOTE — Assessment & Plan Note (Signed)
Importance of annual eye exam is stressed. Decreased acuity in right eye 20/70

## 2014-12-06 NOTE — H&P (Signed)
[facility] History and Physical  [name] EYC:[id number] DOB: 13-Jan-1946 DOA: 12/06/2014  Referring physician:  PCP: Tula Nakayama, MD   Chief Complaint:DOE  HPI: [name] is a very pleasant  69 y.o. male with past medical history that includes hypertension, hyperlipidemia, PVD, diabetes presents emergency department from his PCPs office with the chief complaint of dyspnea on exertion. Initial evaluation in the emergency department reveals hypoxia with oxygen saturation level dropping to 82% on room air and an ABG with PO2 of 68.0.  Patient reports over the last 3 weeks suddenly developed dyspnea with exertion. Prior to that he's had no issues with shortness of breath. He is a former smoker and quit 2 years ago. He states that he cannot walk up a few stairs without getting short of breath and "I start to pant". Walking through the parking lot to the grocery store elicited the same symptoms. He knew he had an appointment with his primary care provider in a couple weeks so he did not seek immediate medical attention. He denies any chest pain palpitation abdominal pain nausea vomiting fever chills or recent sick contacts. He does report flying to [place] about 10 days ago.  Workup in the emergency department includes complete metabolic panel that is unremarkable with the exception of a serum glucose of 132 complete blood count revealing RBCs of 6.22, CT NGO of the chest is negative for PE but does show dependent atelectasis of both lower lobes. 9 chest x-ray is unremarkable. EKG with sinus rhythm and first-degree AV block which is new when compared to EKG done in 2009. Troponin is negative. In the emergency department he is hypertensive with a blood pressure 174/95 otherwise hemodynamically stable oxygen saturation level 88% at the time of my exam on room air increasing to 96% with 2 L nasal cannula  Review of Systems:  10 point review of systems completed and all systems  are negative except as indicated in the history of present illness Past Medical History  Diagnosis Date  . Hyperlipidemia   . Hypertension   . ED (erectile dysfunction)   . PVD (peripheral vascular disease)   . Carotid bruit   . History of renal calculi   . Diabetes mellitus without complication   . DOE (dyspnea on exertion)     2016  . Hypoxia     2016   Past Surgical History  Procedure Laterality Date  . Left wrist    . Rotator cuff repair  1995    right   . Lithotripsy  2010  . 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   Social History:  reports that he quit smoking about 21 months ago. He does not have any smokeless tobacco history on file. He reports that he does not drink alcohol or use illicit drugs. He is married and lives at home with his wife has done so for the last 25 years. He is a retired Scientist, forensic. He is independent with ADLs Allergies  Allergen Reactions  . Metformin And Related Diarrhea    Cannot tolerate    Family History  Problem Relation Age of Onset  . Diabetes Sister    family medical history reviewed mother deceased with CAD father deceased "heart attack" is unsure of the family medical history of his siblings  Prior to Admission medications   Medication Sig Start Date End Date Taking? Authorizing Provider  fluticasone Asencion Islam)  50 MCG/ACT nasal spray Place 2 sprays into both nostrils daily. 01/17/14  Yes Fayrene Helper, MD  insulin aspart (NOVOLOG) 100 UNIT/ML injection Inject 8 Units into the skin 3 (three) times daily with meals.   Yes Historical Provider, MD  insulin detemir (LEVEMIR) 100 UNIT/ML injection Inject 50 Units into the skin at bedtime.   Yes Historical Provider, MD  losartan (COZAAR) 100 MG tablet Take 1 tablet (100 mg total) by mouth daily. 01/17/14  Yes Fayrene Helper, MD  pravastatin (PRAVACHOL) 40 MG  tablet TAKE 1 TABLET BY MOUTH DAILY 12/05/14  Yes Fayrene Helper, MD  Vitamin D, Ergocalciferol, (DRISDOL) 50000 UNITS CAPS capsule Take 50,000 Units by mouth every 7 (seven) days. Tuesday   Yes Historical Provider, MD  ONE TOUCH ULTRA TEST test strip TEST BLOOD SUGAR 2 TIMES DAILY Patient not taking: Reported on 12/06/2014 07/15/14   Fayrene Helper, MD  [facility] LANCETS 75[address] [place] TEST BLOOD SUGAR TWO TIMES DAILY Patient not taking: Reported on 12/06/2014    Fayrene Helper, MD   Physical Exam: Filed Vitals:   12/06/14 1200 12/06/14 1218 12/06/14 1219 12/06/14 1222  BP: 145/92 174/95    Pulse: 79 59 82 74  Temp:      TempSrc:      Resp: _0 Height:      Weight:      SpO2: 95% 82% 88% 96%    Wt Readings from Last 3 Encounters:  12/06/14 103.874 kg (229 lb)  12/06/14 103.874 kg (229 lb)  06/14/14 101.207 kg (223 lb 1.9 oz)    General:  Appears calm and comfortable Eyes: PERRL, normal lids, irises & conjunctiva ENT: grossly normal hearing, lips & tongue Neck: no LAD, masses or thyromegaly Cardiovascular: RRR, no m/r/g. No LE edema.  Respiratory: Normal respiratory effort. Rest sounds diminished particularly in the bases. I hear no wheeze no rhonchi Abdomen: soft, ntnd positive bowel sounds throughout no guarding no rebounding Skin: no rash or induration seen on limited exam Musculoskeletal: grossly normal tone BUE/BLE Psychiatric: grossly normal mood and affect, speech fluent and appropriate Neurologic: grossly non-focal. Speech clear facial symmetry           Labs on Admission:  Basic Metabolic Panel:  Recent Labs Lab 12/06/14 0910  NA 141  K 3.9  CL 109  CO2 25  GLUCOSE 132*  BUN 18  CREATININE 1.18  CALCIUM 9.3   Liver Function Tests:  Recent Labs Lab 12/06/14 0910  AST 22  ALT 22  ALKPHOS 49  BILITOT 0.6  PROT 7.7  ALBUMIN 4.0   No results for input(s): LIPASE, AMYLASE in the last 168 hours. No results for input(s): AMMONIA in  the last 168 hours. CBC:  Recent Labs Lab 12/06/14 0910  WBC 5.9  NEUTROABS 3.4  HGB 16.9  HCT 52.1*  MCV 83.8  PLT 210   Cardiac Enzymes:  Recent Labs Lab 12/06/14 0910  TROPONINI <0.03    BNP (last 3 results) No results for input(s): PROBNP in the last 8760 hours. CBG: No results for input(s): GLUCAP in the last 168 hours.  Radiological Exams on Admission: Dg Chest 2 View  12/06/2014   CLINICAL DATA:  Shortness of breath for 3 weeks.  EXAM: CHEST  2 VIEW  COMPARISON:  June 20, 2009.  FINDINGS: The heart size and mediastinal contours are within normal limits. Both lungs are clear. No pneumothorax or pleural effusion is noted. The visualized skeletal structures are unremarkable.  IMPRESSION: No acute cardiopulmonary abnormality seen.   Electronically Signed   By: Sabino Dick M.D.   On: 12/06/2014 09:57   Ct Angio Chest Pe W/cm &/or Wo Cm  12/06/2014   CLINICAL DATA:  Shortness of breath for 3 weeks, chest pain, history hyperlipidemia, hypertension, carotid bruit, diabetes mellitus, former smoker  EXAM: CT ANGIOGRAPHY CHEST WITH CONTRAST  TECHNIQUE: Multidetector CT imaging of the chest was performed using the standard protocol during bolus administration of intravenous contrast. Multiplanar CT image reconstructions and MIPs were obtained to evaluate the vascular anatomy.  CONTRAST:  134m OMNIPAQUE IOHEXOL 350 MG/ML SOLN total IV; technical failure of scanner after 100 cc of contrast, requiring reinjection of 80 cc and repeat imaging.  COMPARISON:  None  FINDINGS: Scattered atherosclerotic calcification aorta without aneurysm or dissection.  Pulmonary arteries well opacified and patent.  No evidence of pulmonary embolism.  Thoracic adenopathy involving mediastinum and BILATERAL hila.  RIGHT infrahilar lymph node 16 mm short axis image 58.  Subcarinal lymph node 17 mm short axis image 47.  LEFT paratracheal node at the level of the carina 15 mm short axis image 38.  RIGHT  peritracheal lymph node 11 mm short axis image 29.  Additional mediastinal and BILATERAL hilar nodes also noted.  Visualized upper abdomen unremarkable.  Dependent atelectasis in both lower lobes.  Mild scattered peribronchial thickening.  No pulmonary infiltrate, pleural effusion or pneumothorax.  No pulmonary mass/ nodule identified.  Osseous structures unremarkable.  Review of the MIP images confirms the above findings.  IMPRESSION: No evidence of pulmonary embolism.  Dependent atelectasis in both lower lobes.  Nonspecific enlarged mediastinal and hilar lymph nodes as above.  Differential diagnosis includes reactive processes, calcium in disease, metastatic disease and lymphoma.  Consider short-term followup CT in 6-8 weeks to reassess.   Electronically Signed   By: MLavonia DanaM.D.   On: 12/06/2014 11:24    EKG: Independently reviewed sinus rhythm with first-degree AV block  Assessment/Plan Principal Problem:   Hypoxia: Etiology unclear. CT and chest x-ray negative. ABG with a PO2 of 68%. Will admit to telemetry. Will provide oxygen supplementation. Provide nebs. We'll cycle cardiac enzymes and obtain a 2-D echo.   Active Problems: DOE (dyspnea on exertion): Patient is a former smoker he quit 2 years ago. CT does reveal some bibasal atelectasis. See #1. A benefit from outpatient pulmonary function tests.    Essential hypertension: Blood pressure somewhat elevated during his time in the emergency department. He reports "white coat syndrome". Will resume his home meds which include Cozaar. Monitor. Hydralazine as needed.     DM (diabetes mellitus), type 2, uncontrolled. Will obtain a hemoglobin A1c. Currently he is on Levemir 50 units each night will provide Levemir at half the dose given that his appetite is somewhat unreliable. He also is on no coverage at units at home will have to stenosis well. Monitor  Code Status: full DVT Prophylaxis: Family Communication: none present Disposition Plan:  home hopefully in am  Time spent: 615minutes  [facility] Pager [phone]

## 2014-12-06 NOTE — ED Notes (Signed)
Patient ambulated down the hall back to room oxygen level dropped to 86% room air. MD aware.

## 2014-12-07 ENCOUNTER — Other Ambulatory Visit (HOSPITAL_COMMUNITY): Payer: Self-pay | Admitting: Pulmonary Disease

## 2014-12-07 ENCOUNTER — Encounter (HOSPITAL_COMMUNITY): Payer: Self-pay | Admitting: Internal Medicine

## 2014-12-07 DIAGNOSIS — D862 Sarcoidosis of lung with sarcoidosis of lymph nodes: Secondary | ICD-10-CM

## 2014-12-07 DIAGNOSIS — R0902 Hypoxemia: Secondary | ICD-10-CM | POA: Diagnosis not present

## 2014-12-07 DIAGNOSIS — J441 Chronic obstructive pulmonary disease with (acute) exacerbation: Secondary | ICD-10-CM | POA: Diagnosis present

## 2014-12-07 DIAGNOSIS — R590 Localized enlarged lymph nodes: Secondary | ICD-10-CM | POA: Diagnosis present

## 2014-12-07 DIAGNOSIS — R911 Solitary pulmonary nodule: Secondary | ICD-10-CM | POA: Diagnosis not present

## 2014-12-07 LAB — GLUCOSE, CAPILLARY
Glucose-Capillary: 156 mg/dL — ABNORMAL HIGH (ref 70–99)
Glucose-Capillary: 206 mg/dL — ABNORMAL HIGH (ref 70–99)

## 2014-12-07 LAB — BASIC METABOLIC PANEL
ANION GAP: 8 (ref 5–15)
BUN: 15 mg/dL (ref 6–23)
CO2: 25 mmol/L (ref 19–32)
Calcium: 9.3 mg/dL (ref 8.4–10.5)
Chloride: 106 mmol/L (ref 96–112)
Creatinine, Ser: 1.16 mg/dL (ref 0.50–1.35)
GFR calc Af Amer: 72 mL/min — ABNORMAL LOW (ref >90)
GFR, EST NON AFRICAN AMERICAN: 62 mL/min — AB (ref >90)
GLUCOSE: 163 mg/dL — AB (ref 70–99)
Potassium: 4.3 mmol/L (ref 3.5–5.1)
SODIUM: 139 mmol/L (ref 135–145)

## 2014-12-07 LAB — CBC
HEMATOCRIT: 50.1 % (ref 39.0–52.0)
HEMOGLOBIN: 16.1 g/dL (ref 13.0–17.0)
MCH: 27.6 pg (ref 26.0–34.0)
MCHC: 32.1 g/dL (ref 30.0–36.0)
MCV: 85.9 fL (ref 78.0–100.0)
Platelets: 206 10*3/uL (ref 150–400)
RBC: 5.83 MIL/uL — ABNORMAL HIGH (ref 4.22–5.81)
RDW: 15.6 % — AB (ref 11.5–15.5)
WBC: 6.5 10*3/uL (ref 4.0–10.5)

## 2014-12-07 LAB — HEMOGLOBIN A1C
Hgb A1c MFr Bld: 8.4 % — ABNORMAL HIGH (ref <5.7)
Mean Plasma Glucose: 194 mg/dL — ABNORMAL HIGH (ref <117)

## 2014-12-07 MED ORDER — ALBUTEROL SULFATE (2.5 MG/3ML) 0.083% IN NEBU: 2.5000 mg | INHALATION_SOLUTION | Freq: Four times a day (QID) | RESPIRATORY_TRACT | Status: DC | PRN

## 2014-12-07 MED ORDER — ALBUTEROL SULFATE HFA 108 (90 BASE) MCG/ACT IN AERS: 1.0000 | INHALATION_SPRAY | Freq: Four times a day (QID) | RESPIRATORY_TRACT | Status: DC | PRN

## 2014-12-07 MED ORDER — ALBUTEROL SULFATE HFA 108 (90 BASE) MCG/ACT IN AERS: 2.0000 | INHALATION_SPRAY | Freq: Four times a day (QID) | RESPIRATORY_TRACT | Status: DC | PRN

## 2014-12-07 MED ORDER — TIOTROPIUM BROMIDE MONOHYDRATE 18 MCG IN CAPS
18.0000 ug | ORAL_CAPSULE | Freq: Every day | RESPIRATORY_TRACT | Status: DC
  Filled 2014-12-07: qty 5

## 2014-12-07 MED ORDER — TIOTROPIUM BROMIDE MONOHYDRATE 18 MCG IN CAPS
18.0000 ug | ORAL_CAPSULE | Freq: Every day | RESPIRATORY_TRACT | Status: DC
Start: 2014-12-07 — End: 2015-07-26

## 2014-12-07 NOTE — Progress Notes (Signed)
SATURATION QUALIFICATIONS: (This note is used to comply with regulatory documentation for home oxygen)  Patient Saturations on Room Air at Rest = 93%  Patient Saturations on Room Air while Ambulating = 85%  Patient Saturations on 2 Liters of oxygen while Ambulating = 92%  Please briefly explain why patient needs home oxygen:

## 2014-12-07 NOTE — Progress Notes (Signed)
Discharge instructions and prescriptions given, verbalized understanding, verbalized understanding in use of oxygen at home, out in stable condition with staff via w/c.

## 2014-12-07 NOTE — Consult Note (Signed)
Consult requested by: Dr. Jerilee Hoh Consult requested for abnormal chest CT:  HPI: This is a 69 year old who developed problems with shortness of breath over the last 2-3 weeks. He said this was almost immediate in onset. It got to the point that he had difficulty doing activities of daily living. He had CT of the chest done and that shows that he has multiple areas of lymph nodes in the mediastinum and hilum. He is not aware of any symptoms related to this. He has not had night sweats fevers weight loss etc. He does have a history of smoking and probably has some history of COPD although I don't think he has been diagnosed with that at this time.  Past Medical History  Diagnosis Date  . Hyperlipidemia   . Hypertension   . ED (erectile dysfunction)   . PVD (peripheral vascular disease)   . Carotid bruit   . History of renal calculi   . Diabetes mellitus without complication   . DOE (dyspnea on exertion)     2016  . Hypoxia     2016     Family History  Problem Relation Age of Onset  . Diabetes Sister      History   Social History  . Marital Status: Married    Spouse Name: N/A    Number of Children: N/A  . Years of Education: N/A   Social History Main Topics  . Smoking status: Former Smoker -- 0.50 packs/day    Quit date: 02/09/2013  . Smokeless tobacco: None  . Alcohol Use: No  . Drug Use: No  . Sexual Activity: None   Other Topics Concern  . None   Social History Narrative     ROS: No weight loss. He's not noticed any lymph nodes. No cough no fever no chills no night sweats    Objective: Vital signs in last 24 hours: Temp:  [97.5 F (36.4 C)-98.1 F (36.7 C)] 97.8 F (36.6 C) (01/27 5956) Pulse Rate:  [59-110] 77 (01/27 0608) Resp:  [15-19] 17 (01/27 0608) BP: (134-206)/(58-109) 134/78 mmHg (01/27 0608) SpO2:  [82 %-97 %] 96 % (01/27 0733) Weight:  [103.874 kg (229 lb)] 103.874 kg (229 lb) (01/26 1447) Weight change:  Last BM Date: 12/06/14 (per  patient)  Intake/Output from previous day: 01/26 0701 - 01/27 0700 In: 240 [P.O.:240] Out: -   PHYSICAL EXAM He is awake and alert and looks comfortable. His HEENT examination is unremarkable. I do not feel any nodes in his neck or supraclavicular areas or axilla. His nose and throat are clear. His chest is clear. Heart is regular without gallop. His abdomen soft feel any masses bowel sounds present and active. Extremities showed no edema. Central nervous system examination is grossly intact  Lab Results: Basic Metabolic Panel:  Recent Labs  12/06/14 0910 12/07/14 0554  NA 141 139  K 3.9 4.3  CL 109 106  CO2 25 25  GLUCOSE 132* 163*  BUN 18 15  CREATININE 1.18 1.16  CALCIUM 9.3 9.3   Liver Function Tests:  Recent Labs  12/06/14 0910  AST 22  ALT 22  ALKPHOS 49  BILITOT 0.6  PROT 7.7  ALBUMIN 4.0   No results for input(s): LIPASE, AMYLASE in the last 72 hours. No results for input(s): AMMONIA in the last 72 hours. CBC:  Recent Labs  12/06/14 0910 12/07/14 0554  WBC 5.9 6.5  NEUTROABS 3.4  --   HGB 16.9 16.1  HCT 52.1* 50.1  MCV 83.8  85.9  PLT 210 206   Cardiac Enzymes:  Recent Labs  12/06/14 0910 12/06/14 1605 12/06/14 2147  TROPONINI <0.03 <0.03 <0.03   BNP: No results for input(s): PROBNP in the last 72 hours. D-Dimer: No results for input(s): DDIMER in the last 72 hours. CBG:  Recent Labs  12/06/14 1640 12/06/14 2158 12/07/14 0737  GLUCAP 200* 200* 156*   Hemoglobin A1C: No results for input(s): HGBA1C in the last 72 hours. Fasting Lipid Panel: No results for input(s): CHOL, HDL, LDLCALC, TRIG, CHOLHDL, LDLDIRECT in the last 72 hours. Thyroid Function Tests: No results for input(s): TSH, T4TOTAL, FREET4, T3FREE, THYROIDAB in the last 72 hours. Anemia Panel: No results for input(s): VITAMINB12, FOLATE, FERRITIN, TIBC, IRON, RETICCTPCT in the last 72 hours. Coagulation: No results for input(s): LABPROT, INR in the last 72  hours. Urine Drug Screen: Drugs of Abuse  No results found for: LABOPIA, COCAINSCRNUR, LABBENZ, AMPHETMU, THCU, LABBARB  Alcohol Level: No results for input(s): ETH in the last 72 hours. Urinalysis: No results for input(s): COLORURINE, LABSPEC, PHURINE, GLUCOSEU, HGBUR, BILIRUBINUR, KETONESUR, PROTEINUR, UROBILINOGEN, NITRITE, LEUKOCYTESUR in the last 72 hours.  Invalid input(s): APPERANCEUR Misc. Labs:   ABGS:  Recent Labs  12/06/14 1225  PHART 7.400  PO2ART 68.0*  TCO2 19.3  HCO3 22.8     MICROBIOLOGY: No results found for this or any previous visit (from the past 240 hour(s)).  Studies/Results: Dg Chest 2 View  12/06/2014   CLINICAL DATA:  Shortness of breath for 3 weeks.  EXAM: CHEST  2 VIEW  COMPARISON:  June 20, 2009.  FINDINGS: The heart size and mediastinal contours are within normal limits. Both lungs are clear. No pneumothorax or pleural effusion is noted. The visualized skeletal structures are unremarkable.  IMPRESSION: No acute cardiopulmonary abnormality seen.   Electronically Signed   By: Sabino Dick M.D.   On: 12/06/2014 09:57   Ct Angio Chest Pe W/cm &/or Wo Cm  12/06/2014   CLINICAL DATA:  Shortness of breath for 3 weeks, chest pain, history hyperlipidemia, hypertension, carotid bruit, diabetes mellitus, former smoker  EXAM: CT ANGIOGRAPHY CHEST WITH CONTRAST  TECHNIQUE: Multidetector CT imaging of the chest was performed using the standard protocol during bolus administration of intravenous contrast. Multiplanar CT image reconstructions and MIPs were obtained to evaluate the vascular anatomy.  CONTRAST:  184m OMNIPAQUE IOHEXOL 350 MG/ML SOLN total IV; technical failure of scanner after 100 cc of contrast, requiring reinjection of 80 cc and repeat imaging.  COMPARISON:  None  FINDINGS: Scattered atherosclerotic calcification aorta without aneurysm or dissection.  Pulmonary arteries well opacified and patent.  No evidence of pulmonary embolism.  Thoracic  adenopathy involving mediastinum and BILATERAL hila.  RIGHT infrahilar lymph node 16 mm short axis image 58.  Subcarinal lymph node 17 mm short axis image 47.  LEFT paratracheal node at the level of the carina 15 mm short axis image 38.  RIGHT peritracheal lymph node 11 mm short axis image 29.  Additional mediastinal and BILATERAL hilar nodes also noted.  Visualized upper abdomen unremarkable.  Dependent atelectasis in both lower lobes.  Mild scattered peribronchial thickening.  No pulmonary infiltrate, pleural effusion or pneumothorax.  No pulmonary mass/ nodule identified.  Osseous structures unremarkable.  Review of the MIP images confirms the above findings.  IMPRESSION: No evidence of pulmonary embolism.  Dependent atelectasis in both lower lobes.  Nonspecific enlarged mediastinal and hilar lymph nodes as above.  Differential diagnosis includes reactive processes, calcium in disease, metastatic disease and lymphoma.  Consider short-term followup CT in 6-8 weeks to reassess.   Electronically Signed   By: Lavonia Dana M.D.   On: 12/06/2014 11:24    Medications:  Prior to Admission:  Prescriptions prior to admission  Medication Sig Dispense Refill Last Dose  . fluticasone (FLONASE) 50 MCG/ACT nasal spray Place 2 sprays into both nostrils daily. 16 g 6 12/06/2014 at Unknown time  . insulin aspart (NOVOLOG) 100 UNIT/ML injection Inject 8 Units into the skin 3 (three) times daily with meals.   12/06/2014 at Unknown time  . insulin detemir (LEVEMIR) 100 UNIT/ML injection Inject 50 Units into the skin at bedtime.   12/05/2014 at Unknown time  . losartan (COZAAR) 100 MG tablet Take 1 tablet (100 mg total) by mouth daily. 90 tablet 3 12/06/2014 at Unknown time  . pravastatin (PRAVACHOL) 40 MG tablet TAKE 1 TABLET BY MOUTH DAILY 90 tablet 1 12/06/2014 at Unknown time  . Vitamin D, Ergocalciferol, (DRISDOL) 50000 UNITS CAPS capsule Take 50,000 Units by mouth every 7 (seven) days. Tuesday   12/06/2014 at Unknown time   . ONE TOUCH ULTRA TEST test strip TEST BLOOD SUGAR 2 TIMES DAILY (Patient not taking: Reported on 12/06/2014) 100 each 3 Taking  . ONETOUCH DELICA LANCETS 87G MISC TEST BLOOD SUGAR TWO TIMES DAILY (Patient not taking: Reported on 12/06/2014) 100 each 0 Taking   Scheduled: . albuterol  2.5 mg Nebulization Q6H WA  . enoxaparin (LOVENOX) injection  40 mg Subcutaneous Daily  . fluticasone  2 spray Each Nare Daily  . insulin aspart  0-15 Units Subcutaneous TID WC  . insulin aspart  0-5 Units Subcutaneous QHS  . insulin aspart  3 Units Subcutaneous TID WC  . insulin detemir  25 Units Subcutaneous QHS  . losartan  100 mg Oral Daily  . pravastatin  40 mg Oral Daily  . sodium chloride  3 mL Intravenous Q12H  . sodium chloride  3 mL Intravenous Q12H   Continuous:  BMB:OMQTTC chloride, acetaminophen **OR** acetaminophen, albuterol, alum & mag hydroxide-simeth, bisacodyl, HYDROcodone-acetaminophen, morphine injection, ondansetron **OR** ondansetron (ZOFRAN) IV, sodium chloride, traZODone  Assesment: He came in because of hypoxia and shortness of breath. I think he probably does have some element of COPD. He has mediastinal and hilar adenopathy. Principal Problem:   Hypoxia Active Problems:   Essential hypertension   DM (diabetes mellitus), type 2, uncontrolled, with renal complications   DOE (dyspnea on exertion)    Plan: I think he is going to need a PET scan. I would treat him for COPD probably with Spiriva and a rescue inhaler.    LOS: 1 day   Daci Stubbe L 12/07/2014, 8:34 AM

## 2014-12-07 NOTE — Care Management Note (Addendum)
    Page 1 of 2   12/07/2014     3:08:23 PM CARE MANAGEMENT NOTE 12/07/2014  Patient:  [name], [name]   Account Number:  [id number]  Date Initiated:  12/07/2014  Documentation initiated by:  Theophilus Kinds  Subjective/Objective Assessment:   Pt admitted from home with hypoxia. Pt lives with his wife and will return home at discharge. Pt is independent with ADL's.     Action/Plan:   Pt discharged home today. Pt qualifies for home O2 and pt would llike it to be ordered from [facility] in [place]. Orders faxed and portable will be delivered to pts room prior to discharge. Other equipment will be delivered to pts ho.   Anticipated DC Date:  12/07/2014   Anticipated DC Plan:  [place]  CM consult      PAC Choice  DURABLE MEDICAL EQUIPMENT   Choice offered to / List presented to:  C-1 Patient   DME arranged  [facility]      DME agency  [place].        Status of service:  Completed, signed off Medicare Important Message given?   (If response is "NO", the following Medicare IM given date fields will be blank) Date Medicare IM given:   Medicare IM given by:   Date Additional Medicare IM given:   Additional Medicare IM given by:    Discharge Disposition:  HOME/SELF CARE  Per UR Regulation:    If discussed at Long Length of Stay Meetings, dates discussed:    Comments:  12/07/14 [place], RN BSN CM [facility] unable to provide home O2 due to not in network with insurance company. Pt has chosen [facility] for home O2. Emma with [facility] is aware and portable O2 will be delivered to pts room prior to discharge. Pt and pts nurse aware of discharge arrangments.  12/07/14 [place], RN BSN CM

## 2014-12-07 NOTE — Discharge Summary (Signed)
Physician Discharge Summary  [name] MEQ:[id number] DOB: 02/01/1946 DOA: 12/06/2014  PCP: Tula Nakayama, MD  Admit date: 12/06/2014 Discharge date: 12/07/2014  Time spent: 40 minutes  Recommendations for Outpatient Follow-up:  1. Dr Moshe Cipro 1 -2weeks for evaluation of respiratory status and follow PET results 2. Home oxygen  Discharge Diagnoses:  Principal Problem:   Hypoxia Active Problems:   Essential hypertension   DM (diabetes mellitus), type 2, uncontrolled, with renal complications   DOE (dyspnea on exertion)   COPD exacerbation   Adenopathy   Discharge Condition: stable  Diet recommendation: carb modified  Filed Weights   12/06/14 0852 12/06/14 1447  Weight: 103.874 kg (229 lb) 103.874 kg (229 lb)    History of present illness:  [name] is a very pleasant 69 y.o. male with past medical history that includes hypertension, hyperlipidemia, PVD, diabetes, former smoker presented emergency department on 12/06/14 from his PCPs office with the chief complaint of dyspnea on exertion. Initial evaluation in the emergency department revealed hypoxia with oxygen saturation level dropping to 82% on room air and an ABG with PO2 of 68.0.  Patient reported over the prior 3 weeks suddenly developed dyspnea with exertion. Prior to that he'd had no issues with shortness of breath. He is a former smoker and quit 2 years ago. He stated that he cannot walk up a few stairs without getting short of breath and "I start to pant". Walking through the parking lot to the grocery store elicited the same symptoms. He knew he had an appointment with his primary care provider so he did not seek immediate medical attention. He denied any chest pain palpitation abdominal pain nausea vomiting fever chills or recent sick contacts. He reported flying to [place] about 10 days prior.  Workup in the emergency department included complete metabolic panel that was unremarkable with the  exception of a serum glucose of 132 complete blood count revealing RBCs of 6.22, CT angio of the chest  negative for PE but did show dependent atelectasis of both lower lobes and adenopathy mediastinum and hilar nodes and chest x-ray unremarkable. EKG with sinus rhythm and first-degree AV block which is new when compared to EKG done in 2009. Troponin is negative.  In the emergency department he was hypertensive with a blood pressure 174/95 otherwise hemodynamically stable oxygen saturation level 88% at the time of my exam on room air increasing to 96% with 2 L nasal cannula  Hospital Course:  Hypoxia: Etiology likely multifactorial i.e. Related to likely COPD exacerbation in patient with CT worrisome for malignancy. chest x-ray negative. CT negative for PE but reveal lymphadenopathy, 2 decho with EF 60% and mild LVh ang grade 1 diastolic dysfunction,  troponin negative. Provided with oxygen supplementation and nebulizer treatment.  Evaluated by pulmonology who opine COPD exacerbation in former smoker with mediastinal and hilar adenopathy recommending spiriva, rescue inhaler and PET scan. At discharge oxygen saturation on room air with ambulation 84-86%. Will be discharged with home oxygen.    Active Problems: DOE (dyspnea on exertion): Patient is a former smoker he quit 2 years ago. CT does reveal some bibasal atelectasis and mediastinal and hilar adenopathy.  See #1.   COPD with exacerbation: see above. Will discharge with spiriva and albuteral inhaler   Essential hypertension: controlled    DM (diabetes mellitus), type 2, uncontrolled. Hemoglobin A1c 8.5 last month Procedures:  2 dechoWall thickness was increased increased in a pattern of mild to moderate LVH. Systolic function was normal. The estimated  ejection fraction was in the range of 60% to 65%. Wall motion was normal; there were no regional wall motion abnormalities. Doppler parameters are consistent with abnormal left  ventricular relaxation (grade 1 diastolic dysfunction).  Consultations:  Dr Luan Pulling pulmonology  Discharge Exam: Danley Danker Vitals:   12/07/14 1023  BP: 136/70  Pulse:   Temp:   Resp:     General: well nourished Cardiovascular: rrr no MGR no LE edema Respiratory: normal effort BS diminished but clear  Discharge Instructions    Current Discharge Medication List    START taking these medications   Details  albuterol (PROVENTIL HFA;VENTOLIN HFA) 108 (90 BASE) MCG/ACT inhaler Inhale 2 puffs into the lungs every 6 (six) hours as needed for wheezing or shortness of breath. Qty: 1 Inhaler, Refills: 2    tiotropium (SPIRIVA HANDIHALER) 18 MCG inhalation capsule Place 1 capsule (18 mcg total) into inhaler and inhale daily. Qty: 30 capsule, Refills: 12      CONTINUE these medications which have NOT CHANGED   Details  fluticasone (FLONASE) 50 MCG/ACT nasal spray Place 2 sprays into both nostrils daily. Qty: 16 g, Refills: 6    insulin aspart (NOVOLOG) 100 UNIT/ML injection Inject 8 Units into the skin 3 (three) times daily with meals.    insulin detemir (LEVEMIR) 100 UNIT/ML injection Inject 50 Units into the skin at bedtime.    losartan (COZAAR) 100 MG tablet Take 1 tablet (100 mg total) by mouth daily. Qty: 90 tablet, Refills: 3   Associated Diagnoses: Unspecified essential hypertension    pravastatin (PRAVACHOL) 40 MG tablet TAKE 1 TABLET BY MOUTH DAILY Qty: 90 tablet, Refills: 1    Vitamin D, Ergocalciferol, (DRISDOL) 50000 UNITS CAPS capsule Take 50,000 Units by mouth every 7 (seven) days. Tuesday    ONE TOUCH ULTRA TEST test strip TEST BLOOD SUGAR 2 TIMES DAILY Qty: 100 each, Refills: 3    ONETOUCH DELICA LANCETS 03P MISC TEST BLOOD SUGAR TWO TIMES DAILY Qty: 100 each, Refills: 0       Allergies  Allergen Reactions  . Metformin And Related Diarrhea    Cannot tolerate   Follow-up Information    Follow up with Tula Nakayama, MD. Schedule an appointment as  soon as possible for a visit in 1 week.   Specialty:  Family Medicine   Why:  follow PET scan results. evaluation hypoxia   Contact information:   [address] [place] [address] [phone]        The results of significant diagnostics from this hospitalization (including imaging, microbiology, ancillary and laboratory) are listed below for reference.    Significant Diagnostic Studies: Dg Chest 2 View  12/06/2014   CLINICAL DATA:  Shortness of breath for 3 weeks.  EXAM: CHEST  2 VIEW  COMPARISON:  June 20, 2009.  FINDINGS: The heart size and mediastinal contours are within normal limits. Both lungs are clear. No pneumothorax or pleural effusion is noted. The visualized skeletal structures are unremarkable.  IMPRESSION: No acute cardiopulmonary abnormality seen.   Electronically Signed   By: Sabino Dick M.D.   On: 12/06/2014 09:57   Ct Angio Chest Pe W/cm &/or Wo Cm  12/06/2014   CLINICAL DATA:  Shortness of breath for 3 weeks, chest pain, history hyperlipidemia, hypertension, carotid bruit, diabetes mellitus, former smoker  EXAM: CT ANGIOGRAPHY CHEST WITH CONTRAST  TECHNIQUE: Multidetector CT imaging of the chest was performed using the standard protocol during bolus administration of intravenous contrast. Multiplanar CT image reconstructions and  MIPs were obtained to evaluate the vascular anatomy.  CONTRAST:  139m OMNIPAQUE IOHEXOL 350 MG/ML SOLN total IV; technical failure of scanner after 100 cc of contrast, requiring reinjection of 80 cc and repeat imaging.  COMPARISON:  None  FINDINGS: Scattered atherosclerotic calcification aorta without aneurysm or dissection.  Pulmonary arteries well opacified and patent.  No evidence of pulmonary embolism.  Thoracic adenopathy involving mediastinum and BILATERAL hila.  RIGHT infrahilar lymph node 16 mm short axis image 58.  Subcarinal lymph node 17 mm short axis image 47.  LEFT paratracheal node at the level of the carina 15 mm short  axis image 38.  RIGHT peritracheal lymph node 11 mm short axis image 29.  Additional mediastinal and BILATERAL hilar nodes also noted.  Visualized upper abdomen unremarkable.  Dependent atelectasis in both lower lobes.  Mild scattered peribronchial thickening.  No pulmonary infiltrate, pleural effusion or pneumothorax.  No pulmonary mass/ nodule identified.  Osseous structures unremarkable.  Review of the MIP images confirms the above findings.  IMPRESSION: No evidence of pulmonary embolism.  Dependent atelectasis in both lower lobes.  Nonspecific enlarged mediastinal and hilar lymph nodes as above.  Differential diagnosis includes reactive processes, calcium in disease, metastatic disease and lymphoma.  Consider short-term followup CT in 6-8 weeks to reassess.   Electronically Signed   By: MLavonia DanaM.D.   On: 12/06/2014 11:24    Microbiology: No results found for this or any previous visit (from the past 240 hour(s)).   Labs: Basic Metabolic Panel:  Recent Labs Lab 12/06/14 0910 12/07/14 0554  NA 141 139  K 3.9 4.3  CL 109 106  CO2 25 25  GLUCOSE 132* 163*  BUN 18 15  CREATININE 1.18 1.16  CALCIUM 9.3 9.3   Liver Function Tests:  Recent Labs Lab 12/06/14 0910  AST 22  ALT 22  ALKPHOS 49  BILITOT 0.6  PROT 7.7  ALBUMIN 4.0   No results for input(s): LIPASE, AMYLASE in the last 168 hours. No results for input(s): AMMONIA in the last 168 hours. CBC:  Recent Labs Lab 12/06/14 0910 12/07/14 0554  WBC 5.9 6.5  NEUTROABS 3.4  --   HGB 16.9 16.1  HCT 52.1* 50.1  MCV 83.8 85.9  PLT 210 206   Cardiac Enzymes:  Recent Labs Lab 12/06/14 0910 12/06/14 1605 12/06/14 2147  TROPONINI <0.03 <0.03 <0.03   BNP: BNP (last 3 results) No results for input(s): PROBNP in the last 8760 hours. CBG:  Recent Labs Lab 12/06/14 1640 12/06/14 2158 12/07/14 0737  GLUCAP 200* 200* 156*       Signed:  Jotham Ahn M  [facility] 12/07/2014, 11:50 AM

## 2014-12-14 ENCOUNTER — Ambulatory Visit: Payer: Medicare Other | Admitting: Family Medicine

## 2014-12-16 ENCOUNTER — Ambulatory Visit (HOSPITAL_COMMUNITY): Admission: RE | Admit: 2014-12-16 | Payer: Medicare Other | Source: Ambulatory Visit

## 2014-12-22 ENCOUNTER — Encounter (HOSPITAL_COMMUNITY)
Admission: RE | Admit: 2014-12-22 | Discharge: 2014-12-22 | Disposition: A | Discharge status: Home / Self Care | Payer: Medicare Other | Source: Ambulatory Visit | Attending: Pulmonary Disease | Admitting: Pulmonary Disease

## 2014-12-22 ENCOUNTER — Encounter (HOSPITAL_COMMUNITY): Payer: Self-pay

## 2014-12-22 DIAGNOSIS — D862 Sarcoidosis of lung with sarcoidosis of lymph nodes: Secondary | ICD-10-CM | POA: Insufficient documentation

## 2014-12-22 LAB — GLUCOSE, CAPILLARY: Glucose-Capillary: 169 mg/dL — ABNORMAL HIGH (ref 70–99)

## 2014-12-22 MED ORDER — FLUDEOXYGLUCOSE F - 18 (FDG) INJECTION
10.9700 | Freq: Once | INTRAVENOUS | Status: AC | PRN
  Administered 2014-12-22: 10.97 via INTRAVENOUS

## 2014-12-23 ENCOUNTER — Other Ambulatory Visit (HOSPITAL_COMMUNITY): Payer: Self-pay | Admitting: Pulmonary Disease

## 2014-12-23 DIAGNOSIS — IMO0001 Reserved for inherently not codable concepts without codable children: Secondary | ICD-10-CM

## 2014-12-27 ENCOUNTER — Ambulatory Visit (HOSPITAL_COMMUNITY)
Admission: RE | Admit: 2014-12-27 | Discharge: 2014-12-27 | Disposition: A | Discharge status: Home / Self Care | Payer: Medicare Other | Source: Ambulatory Visit | Attending: Pulmonary Disease | Admitting: Pulmonary Disease

## 2014-12-27 DIAGNOSIS — R9402 Abnormal brain scan: Secondary | ICD-10-CM | POA: Insufficient documentation

## 2014-12-27 DIAGNOSIS — IMO0001 Reserved for inherently not codable concepts without codable children: Secondary | ICD-10-CM

## 2015-01-02 ENCOUNTER — Encounter: Payer: Self-pay | Admitting: Family Medicine

## 2015-01-02 ENCOUNTER — Ambulatory Visit (INDEPENDENT_AMBULATORY_CARE_PROVIDER_SITE_OTHER): Payer: Medicare Other | Admitting: Family Medicine

## 2015-01-02 VITALS — BP 140/80 | HR 91 | Resp 16 | Ht 75.0 in | Wt 226.0 lb

## 2015-01-02 DIAGNOSIS — E1165 Type 2 diabetes mellitus with hyperglycemia: Secondary | ICD-10-CM

## 2015-01-02 DIAGNOSIS — Z1211 Encounter for screening for malignant neoplasm of colon: Secondary | ICD-10-CM | POA: Diagnosis not present

## 2015-01-02 DIAGNOSIS — IMO0002 Reserved for concepts with insufficient information to code with codable children: Secondary | ICD-10-CM

## 2015-01-02 DIAGNOSIS — R0902 Hypoxemia: Secondary | ICD-10-CM

## 2015-01-02 DIAGNOSIS — N200 Calculus of kidney: Secondary | ICD-10-CM

## 2015-01-02 DIAGNOSIS — Z86010 Personal history of colonic polyps: Secondary | ICD-10-CM

## 2015-01-02 DIAGNOSIS — Z9889 Other specified postprocedural states: Secondary | ICD-10-CM

## 2015-01-02 DIAGNOSIS — R599 Enlarged lymph nodes, unspecified: Secondary | ICD-10-CM

## 2015-01-02 DIAGNOSIS — IMO0001 Reserved for inherently not codable concepts without codable children: Secondary | ICD-10-CM

## 2015-01-02 DIAGNOSIS — I10 Essential (primary) hypertension: Secondary | ICD-10-CM

## 2015-01-02 DIAGNOSIS — Z794 Long term (current) use of insulin: Secondary | ICD-10-CM

## 2015-01-02 DIAGNOSIS — E1065 Type 1 diabetes mellitus with hyperglycemia: Secondary | ICD-10-CM

## 2015-01-02 DIAGNOSIS — E1129 Type 2 diabetes mellitus with other diabetic kidney complication: Secondary | ICD-10-CM

## 2015-01-02 DIAGNOSIS — R590 Localized enlarged lymph nodes: Secondary | ICD-10-CM

## 2015-01-02 DIAGNOSIS — Z125 Encounter for screening for malignant neoplasm of prostate: Secondary | ICD-10-CM

## 2015-01-02 DIAGNOSIS — G9608 Other cranial cerebrospinal fluid leak: Secondary | ICD-10-CM

## 2015-01-02 DIAGNOSIS — G960 Cerebrospinal fluid leak: Secondary | ICD-10-CM

## 2015-01-02 DIAGNOSIS — Z87891 Personal history of nicotine dependence: Secondary | ICD-10-CM

## 2015-01-02 DIAGNOSIS — I2510 Atherosclerotic heart disease of native coronary artery without angina pectoris: Secondary | ICD-10-CM | POA: Diagnosis not present

## 2015-01-02 DIAGNOSIS — R0609 Other forms of dyspnea: Secondary | ICD-10-CM

## 2015-01-02 DIAGNOSIS — D181 Lymphangioma, any site: Secondary | ICD-10-CM

## 2015-01-02 DIAGNOSIS — E785 Hyperlipidemia, unspecified: Secondary | ICD-10-CM

## 2015-01-02 NOTE — Assessment & Plan Note (Signed)
Multiple CV risk factors and recent imaging study shows cV disease, needs cardiology evaluation

## 2015-01-02 NOTE — Assessment & Plan Note (Signed)
Pt requires supplemental oxygen for activity, he is  Advised of this, he does have oxygen at home

## 2015-01-02 NOTE — Assessment & Plan Note (Signed)
Sub optimal control, no changes in meds at this time

## 2015-01-02 NOTE — Assessment & Plan Note (Signed)
Improved followoed by endo Patient advised to reduce carb and sweets, commit to regular physical activity, take meds as prescribed, test blood as directed, and attempt to lose weight, to improve blood sugar control.

## 2015-01-02 NOTE — Progress Notes (Signed)
Subjective:    Patient ID: [name], male    DOB: 1946/11/06, 69 y.o.   MRN: [id number]  HPI Pt in for hospital follow up, new diagnosis of hypoxia with multiple chest nodules , still being evaluated. He requests seeing a new pulmonary specialist of his daughter's choosing. Still hypoxic in the 80's with minimal activity. Has arthrosclerotic heart disease on scan, needs cardiology re evaluation  Never had the colonoscopy recommended last year, is now ready to do this. Left kidney stone noted , currently asymptomatic Hygroma on head  Scan will look further into this Denies polyuria, polydipsia, blurred vision , or hypoglycemic episodes. Blood sugars range from 14 to 200 throughout the day, testing 4 times daily and giving himself insulin per sliding scale and endo.Doing much better and happy about this   Review of Systems See HPI Denies recent fever or chills. Denies sinus pressure, nasal congestion, ear pain or sore throat. Denies chest congestion, productive cough or wheezing.c/o exertional fatigue  Denies chest pains, palpitations, PND, orthopnea  and leg swelling Denies abdominal pain, nausea, vomiting,diarrhea or constipation.   Denies dysuria, frequency, hesitancy or incontinence. Denies joint pain, swelling and limitation in mobility due to arthritis Denies headaches, seizures, numbness, or tingling. Denies depression, anxiety or insomnia.States he is taking his illness in stride and looking forward to better days ahead when he will be able to travel once more Denies skin break down or rash.        Objective:   Physical Exam  BP 140/80 mmHg  Pulse 91  Resp 16  Ht _0  (1.905 m)  Wt 226 lb (102.513 kg)  BMI 28.25 kg/m2  SpO2 85% Patient alert and oriented and hypoxic with minimal activity  HEENT: No facial asymmetry, EOMI,   oropharynx pink and moist.  Neck supple no JVD, no mass.  Chest: Clear to auscultation bilaterally.Marked decrease in air entry  throughout  CVS: S1, S2 no murmurs, no S3.Regular rate.  ABD: Soft non tender.   Ext: No edema  MS: Adequate ROM spine, shoulders, hips and knees.  Skin: Intact, no ulcerations or rash noted.  Psych: Good eye contact, normal affect. Memory intact not anxious or depressed appearing.  CNS: CN 2-12 intact, power,  normal throughout.no focal deficits noted.       Assessment & Plan:  Hypoxia Persistent hypoxia, multple nodes on recent scan, needs continued pulmonary evaluation and needs supplemental oxygen , pt has elected to seek opulmonary carewith another Doc, I will notify Dr Luan Pulling of this. Referral is entered and he has an appt for this wednesday   CAD in native artery Multiple CV risk factors and recent imaging study shows cV disease, needs cardiology evaluation   DOE (dyspnea on exertion) Pt requires supplemental oxygen for activity, he is  Advised of this, he does have oxygen at home   Essential hypertension Sub optimal control, no changes in meds at this time    DM (diabetes mellitus), type 2, uncontrolled, with renal complications Improved followoed by endo Patient advised to reduce carb and sweets, commit to regular physical activity, take meds as prescribed, test blood as directed, and attempt to lose weight, to improve blood sugar control.    H/O colonoscopy with polypectomy Colonoscopy past due , has been referred in the past but did not follow throuh, now ready to have all  Tests done, cardiopulmonary status needs to be fully addressed stabilized as a priority prior however and he understands. I will also send a  msg to GI Doc    Hyperlipidemia with target LDL less than 100 Updated lab needed at/ before next visit.     Kidney stone on left side Asymptomatic , however has had acute renal colic in the past   Mediastinal adenopathy Pulmonary to follow and further evaluate   Nontraumatic subdural hygroma No recent h/o trauma, no memory loss  noted, no headache history. Will re image as recommended in several weeks/ have neurology eval Currently multiple more pressing referrals needed , will address at next visit

## 2015-01-02 NOTE — Assessment & Plan Note (Signed)
Persistent hypoxia, multple nodes on recent scan, needs continued pulmonary evaluation and needs supplemental oxygen , pt has elected to seek opulmonary carewith another Doc, I will notify Dr Luan Pulling of this. Referral is entered and he has an appt for this wednesday

## 2015-01-02 NOTE — Patient Instructions (Addendum)
F/u in 3 month. Call if you need me before  You need to use oxygen all the time as before  I have referred you to new pulmonary specialist as we discussed, soonest available, and I will let Dr Luan Pulling know  You are referred to cardiologist for evaluation of your heart  You are again  Referred to Dr  Gala Romney  For colonoscopy  Tests show small kidney stone on left   Will look further at head scan report to see if anything more needs to be done  Fasting labs to be drawn as soon as possible please  RECONSIDER FLU VACCINE PLEASE

## 2015-01-04 DIAGNOSIS — J439 Emphysema, unspecified: Secondary | ICD-10-CM | POA: Diagnosis not present

## 2015-01-04 DIAGNOSIS — R599 Enlarged lymph nodes, unspecified: Secondary | ICD-10-CM | POA: Diagnosis not present

## 2015-01-04 DIAGNOSIS — R0602 Shortness of breath: Secondary | ICD-10-CM | POA: Diagnosis not present

## 2015-01-04 DIAGNOSIS — F17211 Nicotine dependence, cigarettes, in remission: Secondary | ICD-10-CM | POA: Diagnosis not present

## 2015-01-06 DIAGNOSIS — G960 Cerebrospinal fluid leak: Secondary | ICD-10-CM

## 2015-01-06 DIAGNOSIS — G9608 Other cranial cerebrospinal fluid leak: Secondary | ICD-10-CM | POA: Insufficient documentation

## 2015-01-06 DIAGNOSIS — D181 Lymphangioma, any site: Secondary | ICD-10-CM | POA: Insufficient documentation

## 2015-01-06 NOTE — Assessment & Plan Note (Signed)
Colonoscopy past due , has been referred in the past but did not follow throuh, now ready to have all  Tests done, cardiopulmonary status needs to be fully addressed stabilized as a priority prior however and he understands. I will also send a msg to GI Doc

## 2015-01-06 NOTE — Assessment & Plan Note (Signed)
Asymptomatic , however has had acute renal colic in the past

## 2015-01-06 NOTE — Assessment & Plan Note (Signed)
Updated lab needed at/ before next visit.

## 2015-01-06 NOTE — Assessment & Plan Note (Addendum)
Pulmonary to follow and further evaluate

## 2015-01-06 NOTE — Assessment & Plan Note (Signed)
No recent h/o trauma, no memory loss noted, no headache history. Will re image as recommended in several weeks/ have neurology eval Currently multiple more pressing referrals needed , will address at next visit

## 2015-01-09 ENCOUNTER — Encounter: Payer: Self-pay | Admitting: Internal Medicine

## 2015-01-17 ENCOUNTER — Ambulatory Visit: Payer: Medicare Other | Admitting: Cardiology

## 2015-01-27 ENCOUNTER — Encounter: Payer: Self-pay | Admitting: Cardiology

## 2015-01-27 ENCOUNTER — Ambulatory Visit (INDEPENDENT_AMBULATORY_CARE_PROVIDER_SITE_OTHER): Payer: Medicare Other | Admitting: Cardiology

## 2015-01-27 VITALS — BP 146/86 | HR 101 | Ht 75.0 in | Wt 225.0 lb

## 2015-01-27 DIAGNOSIS — R0602 Shortness of breath: Secondary | ICD-10-CM

## 2015-01-27 DIAGNOSIS — E785 Hyperlipidemia, unspecified: Secondary | ICD-10-CM

## 2015-01-27 DIAGNOSIS — I10 Essential (primary) hypertension: Secondary | ICD-10-CM

## 2015-01-27 DIAGNOSIS — I2510 Atherosclerotic heart disease of native coronary artery without angina pectoris: Secondary | ICD-10-CM

## 2015-01-27 LAB — HEPATIC FUNCTION PANEL
ALBUMIN: 4.1 g/dL (ref 3.5–5.2)
ALT: 16 U/L (ref 0–53)
AST: 19 U/L (ref 0–37)
Alkaline Phosphatase: 52 U/L (ref 39–117)
BILIRUBIN TOTAL: 0.5 mg/dL (ref 0.2–1.2)
Bilirubin, Direct: 0.1 mg/dL (ref 0.0–0.3)
Indirect Bilirubin: 0.4 mg/dL (ref 0.2–1.2)
Total Protein: 7.7 g/dL (ref 6.0–8.3)

## 2015-01-27 LAB — LIPID PANEL
CHOL/HDL RATIO: 4.3 ratio
Cholesterol: 133 mg/dL (ref 0–200)
HDL: 31 mg/dL — ABNORMAL LOW (ref >=40)
LDL Cholesterol: 65 mg/dL (ref 0–99)
Triglycerides: 184 mg/dL — ABNORMAL HIGH (ref <150)
VLDL: 37 mg/dL (ref 0–40)

## 2015-01-27 NOTE — Progress Notes (Signed)
Clinical Summary Mr. Tornow is a 69 y.o.male seen today as a new patient for the following medical problems.  1. CAD - incidental finding on recent PET scan 12/2014, described as multivessel disease.  - echo Jan 2016 with LVEF 27-06%, grade I diastolic dysfunction  - denies any chest pain. Significant SOB for approx 1 year, being worked up by pulmonary. DOE most at incl[place], significant DOE at [employer] of stairs.   CAD risk factors: tobacco, DM, HTN, cholesterol  2. Dyspnea - hx of pulmonary nodules, followed by pulmonary - echo with PASP 55, moderate RV enlargement with normal function.   3. Hyperlipidemia - compliant with statin - no recent panel in our system  4. HTN - compliant with meds, has not taken today.  - checks at home daily, typically 140/80  Past Medical History  Diagnosis Date  . Hyperlipidemia   . Hypertension   . ED (erectile dysfunction)   . PVD (peripheral vascular disease)   . Carotid bruit   . History of renal calculi   . Diabetes mellitus without complication   . DOE (dyspnea on exertion)     2016  . Hypoxia     2016  . COPD (chronic obstructive pulmonary disease)      Allergies  Allergen Reactions  . Metformin And Related Diarrhea    Cannot tolerate     Current Outpatient Prescriptions  Medication Sig Dispense Refill  . albuterol (PROVENTIL HFA;VENTOLIN HFA) 108 (90 BASE) MCG/ACT inhaler Inhale 2 puffs into the lungs every 6 (six) hours as needed for wheezing or shortness of breath. 1 Inhaler 2  . fluticasone (FLONASE) 50 MCG/ACT nasal spray Place 2 sprays into both nostrils daily. 16 g 6  . insulin aspart (NOVOLOG) 100 UNIT/ML injection Inject 8 Units into the skin 3 (three) times daily with meals.    . insulin detemir (LEVEMIR) 100 UNIT/ML injection Inject 50 Units into the skin at bedtime.    [name] losartan (COZAAR) 100 MG tablet Take 1 tablet (100 mg total) by mouth daily. 90 tablet 3  . ONE TOUCH ULTRA TEST test strip TEST BLOOD  SUGAR 2 TIMES DAILY (Patient not taking: Reported on 12/06/2014) 100 each 3  . ONETOUCH DELICA LANCETS 23J MISC TEST BLOOD SUGAR TWO TIMES DAILY (Patient not taking: Reported on 12/06/2014) 100 each 0  . pravastatin (PRAVACHOL) 40 MG tablet TAKE 1 TABLET BY MOUTH DAILY 90 tablet 1  . tiotropium (SPIRIVA HANDIHALER) 18 MCG inhalation capsule Place 1 capsule (18 mcg total) into inhaler and inhale daily. 30 capsule 12  . Vitamin D, Ergocalciferol, (DRISDOL) 50000 UNITS CAPS capsule Take 50,000 Units by mouth every 7 (seven) days. Tuesday     No current facility-administered medications for this visit.     Past Surgical History  Procedure Laterality Date  . Left wrist    . Rotator cuff repair  1995    right   . Lithotripsy  2010  . 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     Allergies  Allergen Reactions  . Metformin And Related Diarrhea    Cannot tolerate      Family History  Problem Relation Age of Onset  . Diabetes Sister      Social History Mr. Cardozo reports that he quit smoking about 1 years ago. He does not have any smokeless tobacco history on file. Mr. Mcpeek reports that he does not  drink alcohol.   Review of Systems CONSTITUTIONAL: No weight loss, fever, chills, weakness or fatigue.  HEENT: Eyes: No visual loss, blurred vision, double vision or yellow sclerae.No hearing loss, sneezing, congestion, runny nose or sore throat.  SKIN: No rash or itching.  CARDIOVASCULAR: per HPI RESPIRATORY: No cough or sputum.  GASTROINTESTINAL: No anorexia, nausea, vomiting or diarrhea. No abdominal pain or blood.  GENITOURINARY: No burning on urination, no polyuria NEUROLOGICAL: No headache, dizziness, syncope, paralysis, ataxia, numbness or tingling in the extremities. No change in bowel or bladder control.  MUSCULOSKELETAL: No muscle, back pain, joint pain or  stiffness.  LYMPHATICS: No enlarged nodes. No history of splenectomy.  PSYCHIATRIC: No history of depression or anxiety.  ENDOCRINOLOGIC: No reports of sweating, cold or heat intolerance. No polyuria or polydipsia.  [name]   Physical Examination p 101 bp 146/86 Wt 225 lbs BMI 28 Gen: resting comfortably, no acute distress HEENT: no scleral icterus, pupils equal round and reactive, no palptable cervical adenopathy,  CV: RRR, no m/r/g, no JVD, no carotid bruits Resp: Clear to auscultation bilaterally GI: abdomen is soft, non-tender, non-distended, normal bowel sounds, no hepatosplenomegaly MSK: extremities are warm, no edema.  Skin: warm, no rash Neuro:  no focal deficits Psych: appropriate affect   Diagnostic Studies Jan 2016 echo Study Conclusions  - Left ventricle: The cavity size was normal. Wall thickness was increased increased in a pattern of mild to moderate LVH. Systolic function was normal. The estimated ejection fraction was in the range of 60% to 65%. Wall motion was normal; there were no regional wall motion abnormalities. Doppler parameters are consistent with abnormal left ventricular relaxation (grade 1 diastolic dysfunction). - Aortic valve: Mildly calcified annulus. Trileaflet; mildly thickened leaflets. - Mitral valve: Mildly calcified annulus. Mildly thickened leaflets . - Left atrium: The atrium was mildly dilated. - Right ventricle: The cavity size was mildly to moderately dilated. The RV shares the apex with the LV. Systolic function was normal. RV TAPSE is 1.7 cm. - Right atrium: The atrium was moderately dilated. - Pulmonary arteries: Systolic pressure was moderately increased. PA peak pressure: 55 mm Hg (S). - Technically adequate study.   03/2013 Carotid US IMPRESSION:  1. Mild atherosclerotic plaque in the bilateral distal common and proximal internal carotid arteries resulting in less than 50% stenosis bilaterally. No  significant interval progression compared to January 2011.  2. Vertebral arteries are patent with antegrade flow.  11/2014 CT PE IMPRESSION: No evidence of pulmonary embolism.  Dependent atelectasis in both lower lobes.  Nonspecific enlarged mediastinal and hilar lymph nodes as above.  Differential diagnosis includes reactive processes, calcium in disease, metastatic disease and lymphoma.  Consider short-term followup CT in 6-8 weeks to reassess.   Assessment and Plan  1. CAD - incidental finding on recent PET scan, noted multivessel coronary atherosclerosis. Unclear functionality of these lesions. No chest pain chronic significant SOB/DOE - will obtain Lexiscan MPI - start ASA 81 mg daily  2. Hyperlipidemia - f/u lipid panel, continnue current statin  3. HTN - mildly above goal based on home nuimbers given hx of DM. He gill keep a bp log and submit next visit, if remains elevated will require further titration of meds  F/u pending stress results    Arnoldo Lenis, M.D.

## 2015-01-27 NOTE — Patient Instructions (Signed)
Your physician recommends that you schedule a follow-up appointment in:  To be determined after [place] physician has requested that you have a lexiscan myoview. For further information please visit [email or web address]. Please follow instruction sheet, as given.    START aspirin 81 mg daily    Please keep BP log with daily blood pressure readings      Thank you for choosing [facility] !

## 2015-01-28 LAB — PSA, MEDICARE: PSA: 0.64 ng/mL (ref <=4.00)

## 2015-01-28 LAB — VITAMIN D 25 HYDROXY (VIT D DEFICIENCY, FRACTURES): Vit D, 25-Hydroxy: 18 ng/mL — ABNORMAL LOW (ref 30–100)

## 2015-01-31 MED ORDER — VITAMIN D (ERGOCALCIFEROL) 1.25 MG (50000 UNIT) PO CAPS: 50000.0000 [IU] | ORAL_CAPSULE | ORAL | Status: DC

## 2015-01-31 NOTE — Addendum Note (Signed)
Addended by: Denman George B on: 01/31/2015 10:30 AM   Modules accepted: Orders

## 2015-02-02 ENCOUNTER — Encounter (HOSPITAL_COMMUNITY): Payer: Self-pay

## 2015-02-02 ENCOUNTER — Encounter (HOSPITAL_COMMUNITY)
Admission: RE | Admit: 2015-02-02 | Discharge: 2015-02-02 | Disposition: A | Discharge status: Home / Self Care | Payer: Medicare Other | Source: Ambulatory Visit | Attending: Cardiology | Admitting: Cardiology

## 2015-02-02 ENCOUNTER — Ambulatory Visit (HOSPITAL_COMMUNITY)
Admission: RE | Admit: 2015-02-02 | Discharge: 2015-02-02 | Disposition: A | Discharge status: Home / Self Care | Payer: Medicare Other | Source: Ambulatory Visit | Attending: Cardiology | Admitting: Cardiology

## 2015-02-02 DIAGNOSIS — R0602 Shortness of breath: Secondary | ICD-10-CM | POA: Insufficient documentation

## 2015-02-02 DIAGNOSIS — I2510 Atherosclerotic heart disease of native coronary artery without angina pectoris: Secondary | ICD-10-CM | POA: Insufficient documentation

## 2015-02-02 DIAGNOSIS — R0600 Dyspnea, unspecified: Secondary | ICD-10-CM | POA: Diagnosis not present

## 2015-02-02 HISTORY — DX: Unspecified asthma, uncomplicated: J45.909

## 2015-02-02 MED ORDER — TECHNETIUM TC 99M SESTAMIBI GENERIC - CARDIOLITE
30.0000 | Freq: Once | INTRAVENOUS | Status: AC | PRN
  Administered 2015-02-02: 30 via INTRAVENOUS

## 2015-02-02 MED ORDER — REGADENOSON 0.4 MG/5ML IV SOLN
INTRAVENOUS | Status: AC
  Administered 2015-02-02: 0.4 mg via INTRAVENOUS
  Filled 2015-02-02: qty 5

## 2015-02-02 MED ORDER — REGADENOSON 0.4 MG/5ML IV SOLN
0.4000 mg | Freq: Once | INTRAVENOUS | Status: AC | PRN
  Administered 2015-02-02: 0.4 mg via INTRAVENOUS

## 2015-02-02 MED ORDER — SODIUM CHLORIDE 0.9 % IJ SOLN
INTRAMUSCULAR | Status: AC
Start: 2015-02-02 — End: 2015-02-02
  Administered 2015-02-02: 10 mL via INTRAVENOUS
  Filled 2015-02-02: qty 3

## 2015-02-02 MED ORDER — SODIUM CHLORIDE 0.9 % IJ SOLN
10.0000 mL | INTRAMUSCULAR | Status: DC | PRN
  Administered 2015-02-02: 10 mL via INTRAVENOUS
  Filled 2015-02-02: qty 10

## 2015-02-02 MED ORDER — TECHNETIUM TC 99M SESTAMIBI - CARDIOLITE
10.0000 | Freq: Once | INTRAVENOUS | Status: AC | PRN
  Administered 2015-02-02: 10 via INTRAVENOUS

## 2015-02-02 NOTE — Progress Notes (Signed)
Stress Lab Nurses Notes - Forestine Na  ROCKNE DEARINGER 02/02/2015 Reason for doing test: CAD and Dyspnea Type of test: Wille Glaser Nurse performing test: Gerrit Halls, RN Nuclear Medicine Tech: Melburn Hake Echo Tech: Not Applicable MD performing test: Koneswaran/K.Purcell Nails NP Family MD: Moshe Cipro Test explained and consent signed: Yes.   IV started: Saline lock flushed, No redness or edema and Saline lock started in radiology Symptoms: SOB Treatment/Intervention: None Reason test stopped: protocol completed After recovery IV was: Discontinued via X-ray tech and No redness or edema Patient to return to Nuc. Med at : 11:15 Patient discharged: Home Patient's Condition upon discharge was: stable Comments: During test BP 169/81 & HR 106.  Recovery BP 168/97 & HR 94.  Symptoms resolved in recovery.  Geanie Cooley T

## 2015-02-07 ENCOUNTER — Encounter: Payer: Self-pay | Admitting: Gastroenterology

## 2015-02-07 ENCOUNTER — Other Ambulatory Visit: Payer: Self-pay

## 2015-02-07 ENCOUNTER — Ambulatory Visit (INDEPENDENT_AMBULATORY_CARE_PROVIDER_SITE_OTHER): Payer: Medicare Other | Admitting: Gastroenterology

## 2015-02-07 VITALS — BP 153/96 | HR 98 | Temp 97.6°F | Ht 75.0 in | Wt 225.6 lb

## 2015-02-07 DIAGNOSIS — Z86010 Personal history of colonic polyps: Secondary | ICD-10-CM

## 2015-02-07 MED ORDER — PEG 3350-KCL-NA BICARB-NACL 420 G PO SOLR: 4000.0000 mL | ORAL | Status: DC

## 2015-02-07 MED ORDER — PEG-KCL-NACL-NASULF-NA ASC-C 100 G PO SOLR: 1.0000 | ORAL | Status: DC

## 2015-02-07 NOTE — Patient Instructions (Signed)
1. Colonoscopy as scheduled. See separate instructions.

## 2015-02-07 NOTE — Progress Notes (Signed)
Primary Care Physician:  Tula Nakayama, MD  Primary Gastroenterologist:  Garfield Cornea, MD   Chief Complaint  Patient presents with  . set up TCS    HPI:  [name] is a 69 y.o. male here to set up surveillance colonoscopy. Last one was done in January 2009, 2 polyps removed from the sigmoid colon were adenomatous. Patient hospitalized back in January with hypoxemia and went home on oxygen. CT angina with mediastinal lymphadenopathy. Questionable COPD. Seen by Dr. Luan Pulling and subsequently has been going to [facility] for second opinion. Seen by cardiology as well and had unremarkable stress Lexiscan Myoview.  Patient denies any constipation, diarrhea, melena, rectal bleeding, abdominal pain, vomiting, heartburn, dysphagia, unintentional weight loss.       Current Outpatient Prescriptions  Medication Sig Dispense Refill  . albuterol (PROVENTIL HFA;VENTOLIN HFA) 108 (90 BASE) MCG/ACT inhaler Inhale 2 puffs into the lungs every 6 (six) hours as needed for wheezing or shortness of breath. 1 Inhaler 2  . aspirin EC 81 MG tablet Take 81 mg by mouth daily.    . fluticasone (FLONASE) 50 MCG/ACT nasal spray Place 2 sprays into both nostrils daily. 16 g 6  . insulin aspart (NOVOLOG) 100 UNIT/ML injection Inject 8 Units into the skin 3 (three) times daily with meals.    . insulin detemir (LEVEMIR) 100 UNIT/ML injection Inject 50 Units into the skin at bedtime.    [name] losartan (COZAAR) 100 MG tablet Take 1 tablet (100 mg total) by mouth daily. 90 tablet 3  . ONE TOUCH ULTRA TEST test strip TEST BLOOD SUGAR 2 TIMES DAILY 100 each 3  . ONETOUCH DELICA LANCETS 16X MISC TEST BLOOD SUGAR TWO TIMES DAILY 100 each 0  . pravastatin (PRAVACHOL) 40 MG tablet TAKE 1 TABLET BY MOUTH DAILY 90 tablet 1  . tiotropium (SPIRIVA HANDIHALER) 18 MCG inhalation capsule Place 1 capsule (18 mcg total) into inhaler and inhale daily. 30 capsule 12  . Vitamin D, Ergocalciferol, (DRISDOL) 50000 UNITS CAPS capsule  Take 1 capsule (50,000 Units total) by mouth every 7 (seven) days. Tuesday 4 capsule 5   No current facility-administered medications for this visit.    Allergies as of 02/07/2015 - Review Complete 02/07/2015  Allergen Reaction Noted  . Metformin and related Diarrhea 01/17/2014    Past Medical History  Diagnosis Date  . Hyperlipidemia   . Hypertension   . ED (erectile dysfunction)   . PVD (peripheral vascular disease)   . Carotid bruit   . History of renal calculi   . Diabetes mellitus without complication   . DOE (dyspnea on exertion)     2016  . Hypoxia     2016  . COPD (chronic obstructive pulmonary disease)   . Asthma     Past Surgical History  Procedure Laterality Date  . Left wrist    . Rotator cuff repair  1995    right   . Lithotripsy  2010  . 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 tubular adenomas    Family History  Problem Relation Age of Onset  . Diabetes Sister     History   Social History  . Marital Status: Married    Spouse Name: N/A  . Number of Children: N/A  . Years of Education: N/A   Occupational History  . Not on file.   Social History Main Topics  . Smoking status: Former Smoker -- 0.50 packs/day  Start date: 05/04/1964    Quit date: 02/09/2013  . Smokeless tobacco: Never Used  . Alcohol Use: No  . Drug Use: No  . Sexual Activity: Not on file   Other Topics Concern  . Not on file   Social History Narrative      ROS:  General: Negative for anorexia, weight loss, fever, chills, fatigue.+ weakness. Eyes: Negative for vision changes.  ENT: Negative for hoarseness, difficulty swallowing , nasal congestion. CV: Negative for chest pain, angina, palpitations. Positive dyspnea on exertion. No peripheral edema  Respiratory: Negative for dyspnea at rest, cough, sputum, wheezing. Positive dyspnea on exertion. GI: See  history of present illness. GU:  Negative for dysuria, hematuria, urinary incontinence, urinary frequency, nocturnal urination.  MS: Negative for joint pain, low back pain.  Derm: Negative for rash or itching.  Neuro: Negative for weakness, abnormal sensation, seizure, frequent headaches, memory loss, confusion.  Psych: Negative for anxiety, depression, suicidal ideation, hallucinations.  Endo: Negative for unusual weight change.  Heme: Negative for bruising or bleeding. Allergy: Negative for rash or hives.    Physical Examination:  BP 153/96 mmHg  Pulse 98  Temp(Src) 97.6 F (36.4 C)  Ht _0  (1.905 m)  Wt 225 lb 9.6 oz (102.331 kg)  BMI 28.20 kg/m2   General: Well-nourished, well-developed in no acute distress.  Head: Normocephalic, atraumatic.   Eyes: Conjunctiva pink, no icterus. Mouth: Oropharyngeal mucosa moist and pink , no lesions erythema or exudate. Neck: Supple without thyromegaly, masses, or lymphadenopathy.  Lungs: Clear to auscultation bilaterally.  Heart: Regular rate and rhythm, no murmurs rubs or gallops.  Abdomen: Bowel sounds are normal, nontender, nondistended, no hepatosplenomegaly or masses, no abdominal bruits or    hernia , no rebound or guarding.   Rectal: deferred Extremities: No lower extremity edema. No clubbing or deformities.  Neuro: Alert and oriented x 4 , grossly normal neurologically.  Skin: Warm and dry, no rash or jaundice.   Psych: Alert and cooperative, normal mood and affect.  Labs: Lab Results  Component Value Date   ALT 16 01/27/2015   AST 19 01/27/2015   ALKPHOS 52 01/27/2015   BILITOT 0.5 01/27/2015   Lab Results  Component Value Date   CREATININE 1.16 12/07/2014   BUN 15 12/07/2014   NA 139 12/07/2014   K 4.3 12/07/2014   CL 106 12/07/2014   CO2 25 12/07/2014   Lab Results  Component Value Date   WBC 6.5 12/07/2014   HGB 16.1 12/07/2014   HCT 50.1 12/07/2014   MCV 85.9 12/07/2014   PLT 206 12/07/2014      Imaging Studies: Nm Myocar Multi W/spect W/wall Motion / Ef  02/02/2015   CLINICAL DATA:  69 year old male with history of coronary atherosclerosis, hyperlipidemia, hypertension, and shortness of breath. This study is requested to evaluate for the presence and extent of ischemia.  EXAM: MYOCARDIAL IMAGING WITH SPECT (REST AND PHARMACOLOGIC-STRESS)  GATED LEFT VENTRICULAR WALL MOTION STUDY  LEFT VENTRICULAR EJECTION FRACTION  TECHNIQUE: Standard myocardial SPECT imaging was performed after resting intravenous injection of 10 mCi Tc-60msestamibi. Subsequently, intravenous infusion of Lexiscan was performed under the supervision of the Cardiology staff. At peak effect of the drug, 30 mCi Tc-93mestamibi was injected intravenously and standard myocardial SPECT imaging was performed. Quantitative gated imaging was also performed to evaluate left ventricular wall motion, and estimate left ventricular ejection fraction.  FINDINGS: Baseline tracing shows normal sinus rhythm at 91 beats per min with nonspecific T-wave changes and borderline  increased voltage. Lexiscan bolus was given in standard fashion. Heart rate increased from 90 beats per min up to 106 beats per min, and blood pressure increased from 182/108 up to 190/100. No chest pain was reported. There were no diagnostic ST segment abnormalities, and no arrhythmias were noted.  Analysis of the raw perfusion data finds adequate myocardial perfusion uptake.  Perfusion: No decreased activity in the left ventricle on stress imaging to suggest reversible ischemia or infarction.  Wall Motion: Normal left ventricular wall motion. No left ventricular dilation.  Left Ventricular Ejection Fraction: 61 %  End diastolic volume 81 ml  End systolic volume 32 ml  IMPRESSION: 1. No reversible ischemia or infarction.  2. Normal left ventricular wall motion.  3. Left ventricular ejection fraction 61%  4. Low-risk stress test findings*.  *2012 Appropriate Use Criteria for  Coronary Revascularization Focused Update: J Am Coll Cardiol. 9983;38(2):505-397. http://content.[email or web address].aspx?articleid=[email or web address]   Electronically Signed   By: Rozann Lesches M.D.   On: 02/02/2015 15:25

## 2015-02-09 NOTE — Assessment & Plan Note (Signed)
69 year old gentleman with history of adenomatous colon polyps in 2009, due for surveillance at this time. Denies any GI symptoms.  I have discussed the risks, alternatives, benefits with regards to but not limited to the risk of reaction to medication, bleeding, infection, perforation and the patient is agreeable to proceed. Written consent to be obtained.

## 2015-02-09 NOTE — Progress Notes (Signed)
cc'ed to pcp °

## 2015-02-23 ENCOUNTER — Other Ambulatory Visit: Payer: Self-pay | Admitting: Family Medicine

## 2015-03-06 ENCOUNTER — Encounter (HOSPITAL_COMMUNITY)
Admission: RE | Disposition: A | Discharge status: Home / Self Care | Payer: Self-pay | Source: Ambulatory Visit | Attending: Internal Medicine

## 2015-03-06 ENCOUNTER — Encounter (HOSPITAL_COMMUNITY): Payer: Self-pay | Admitting: *Deleted

## 2015-03-06 ENCOUNTER — Ambulatory Visit (HOSPITAL_COMMUNITY)
Admission: RE | Admit: 2015-03-06 | Discharge: 2015-03-06 | Disposition: A | Discharge status: Home / Self Care | Payer: Medicare Other | Source: Ambulatory Visit | Attending: Internal Medicine | Admitting: Internal Medicine

## 2015-03-06 DIAGNOSIS — D122 Benign neoplasm of ascending colon: Secondary | ICD-10-CM

## 2015-03-06 DIAGNOSIS — K621 Rectal polyp: Secondary | ICD-10-CM | POA: Insufficient documentation

## 2015-03-06 DIAGNOSIS — Z86010 Personal history of colonic polyps: Secondary | ICD-10-CM | POA: Diagnosis not present

## 2015-03-06 DIAGNOSIS — I10 Essential (primary) hypertension: Secondary | ICD-10-CM | POA: Diagnosis not present

## 2015-03-06 DIAGNOSIS — E119 Type 2 diabetes mellitus without complications: Secondary | ICD-10-CM | POA: Insufficient documentation

## 2015-03-06 DIAGNOSIS — J449 Chronic obstructive pulmonary disease, unspecified: Secondary | ICD-10-CM | POA: Insufficient documentation

## 2015-03-06 DIAGNOSIS — D129 Benign neoplasm of anus and anal canal: Secondary | ICD-10-CM

## 2015-03-06 DIAGNOSIS — K6389 Other specified diseases of intestine: Secondary | ICD-10-CM | POA: Diagnosis not present

## 2015-03-06 DIAGNOSIS — Z7951 Long term (current) use of inhaled steroids: Secondary | ICD-10-CM | POA: Insufficient documentation

## 2015-03-06 DIAGNOSIS — E785 Hyperlipidemia, unspecified: Secondary | ICD-10-CM | POA: Diagnosis not present

## 2015-03-06 DIAGNOSIS — Z7982 Long term (current) use of aspirin: Secondary | ICD-10-CM | POA: Insufficient documentation

## 2015-03-06 DIAGNOSIS — Z79899 Other long term (current) drug therapy: Secondary | ICD-10-CM | POA: Insufficient documentation

## 2015-03-06 DIAGNOSIS — I2510 Atherosclerotic heart disease of native coronary artery without angina pectoris: Secondary | ICD-10-CM | POA: Insufficient documentation

## 2015-03-06 DIAGNOSIS — I739 Peripheral vascular disease, unspecified: Secondary | ICD-10-CM | POA: Diagnosis not present

## 2015-03-06 DIAGNOSIS — J45909 Unspecified asthma, uncomplicated: Secondary | ICD-10-CM | POA: Insufficient documentation

## 2015-03-06 DIAGNOSIS — Z09 Encounter for follow-up examination after completed treatment for conditions other than malignant neoplasm: Secondary | ICD-10-CM | POA: Diagnosis present

## 2015-03-06 DIAGNOSIS — Z87891 Personal history of nicotine dependence: Secondary | ICD-10-CM | POA: Insufficient documentation

## 2015-03-06 DIAGNOSIS — Z794 Long term (current) use of insulin: Secondary | ICD-10-CM | POA: Diagnosis not present

## 2015-03-06 HISTORY — PX: COLONOSCOPY: SHX5424

## 2015-03-06 LAB — GLUCOSE, CAPILLARY
GLUCOSE-CAPILLARY: 136 mg/dL — AB (ref 70–99)
GLUCOSE-CAPILLARY: 127 mg/dL — AB (ref 70–99)

## 2015-03-06 SURGERY — COLONOSCOPY
Anesthesia: Moderate Sedation

## 2015-03-06 MED ORDER — MIDAZOLAM HCL 5 MG/5ML IJ SOLN
INTRAMUSCULAR | Status: DC | PRN
  Administered 2015-03-06: 1 mg via INTRAVENOUS
  Administered 2015-03-06 (×2): 2 mg via INTRAVENOUS
  Administered 2015-03-06 (×2): 0.5 mg via INTRAVENOUS

## 2015-03-06 MED ORDER — STERILE WATER FOR IRRIGATION IR SOLN
Status: DC | PRN
  Administered 2015-03-06: 09:00:00

## 2015-03-06 MED ORDER — ONDANSETRON HCL 4 MG/2ML IJ SOLN
INTRAMUSCULAR | Status: DC | PRN
  Administered 2015-03-06: 4 mg via INTRAVENOUS

## 2015-03-06 MED ORDER — MIDAZOLAM HCL 5 MG/5ML IJ SOLN
INTRAMUSCULAR | Status: AC
  Filled 2015-03-06: qty 10

## 2015-03-06 MED ORDER — ONDANSETRON HCL 4 MG/2ML IJ SOLN
INTRAMUSCULAR | Status: AC
  Filled 2015-03-06: qty 2

## 2015-03-06 MED ORDER — MEPERIDINE HCL 100 MG/ML IJ SOLN
INTRAMUSCULAR | Status: DC | PRN
  Administered 2015-03-06: 25 mg via INTRAVENOUS
  Administered 2015-03-06: 50 mg via INTRAVENOUS
  Administered 2015-03-06: 25 mg via INTRAVENOUS

## 2015-03-06 MED ORDER — SODIUM CHLORIDE 0.9 % IV SOLN
INTRAVENOUS | Status: DC
  Administered 2015-03-06: 08:00:00 via INTRAVENOUS

## 2015-03-06 MED ORDER — MEPERIDINE HCL 100 MG/ML IJ SOLN
INTRAMUSCULAR | Status: AC
  Filled 2015-03-06: qty 2

## 2015-03-06 NOTE — Interval H&P Note (Signed)
History and Physical Interval Note:  03/06/2015 8:33 AM  [name]  has presented today for surgery, with the diagnosis of history of polyps  The various methods of treatment have been discussed with the patient and family. After consideration of risks, benefits and other options for treatment, the patient has consented to  Procedure(s) with comments: COLONOSCOPY (N/A) - 830  as a surgical intervention .  The patient's history has been reviewed, patient examined, no change in status, stable for surgery.  I have reviewed the patient's chart and labs.  Questions were answered to the patient's satisfaction.     [name]   No change. Surveillance colonoscopy per plan.The risks, benefits, limitations, alternatives and imponderables have been reviewed with the patient. Questions have been answered. All parties are agreeable.

## 2015-03-06 NOTE — H&P (View-Only) (Signed)
Primary Care Physician:  Tula Nakayama, MD  Primary Gastroenterologist:  Garfield Cornea, MD   Chief Complaint  Patient presents with  . set up TCS    HPI:  [name] is a 69 y.o. male here to set up surveillance colonoscopy. Last one was done in January 2009, 2 polyps removed from the sigmoid colon were adenomatous. Patient hospitalized back in January with hypoxemia and went home on oxygen. CT angina with mediastinal lymphadenopathy. Questionable COPD. Seen by Dr. Luan Pulling and subsequently has been going to [facility] for second opinion. Seen by cardiology as well and had unremarkable stress Lexiscan Myoview.  Patient denies any constipation, diarrhea, melena, rectal bleeding, abdominal pain, vomiting, heartburn, dysphagia, unintentional weight loss.       Current Outpatient Prescriptions  Medication Sig Dispense Refill  . albuterol (PROVENTIL HFA;VENTOLIN HFA) 108 (90 BASE) MCG/ACT inhaler Inhale 2 puffs into the lungs every 6 (six) hours as needed for wheezing or shortness of breath. 1 Inhaler 2  . aspirin EC 81 MG tablet Take 81 mg by mouth daily.    . fluticasone (FLONASE) 50 MCG/ACT nasal spray Place 2 sprays into both nostrils daily. 16 g 6  . insulin aspart (NOVOLOG) 100 UNIT/ML injection Inject 8 Units into the skin 3 (three) times daily with meals.    . insulin detemir (LEVEMIR) 100 UNIT/ML injection Inject 50 Units into the skin at bedtime.    [name] losartan (COZAAR) 100 MG tablet Take 1 tablet (100 mg total) by mouth daily. 90 tablet 3  . ONE TOUCH ULTRA TEST test strip TEST BLOOD SUGAR 2 TIMES DAILY 100 each 3  . ONETOUCH DELICA LANCETS 16X MISC TEST BLOOD SUGAR TWO TIMES DAILY 100 each 0  . pravastatin (PRAVACHOL) 40 MG tablet TAKE 1 TABLET BY MOUTH DAILY 90 tablet 1  . tiotropium (SPIRIVA HANDIHALER) 18 MCG inhalation capsule Place 1 capsule (18 mcg total) into inhaler and inhale daily. 30 capsule 12  . Vitamin D, Ergocalciferol, (DRISDOL) 50000 UNITS CAPS capsule  Take 1 capsule (50,000 Units total) by mouth every 7 (seven) days. Tuesday 4 capsule 5   No current facility-administered medications for this visit.    Allergies as of 02/07/2015 - Review Complete 02/07/2015  Allergen Reaction Noted  . Metformin and related Diarrhea 01/17/2014    Past Medical History  Diagnosis Date  . Hyperlipidemia   . Hypertension   . ED (erectile dysfunction)   . PVD (peripheral vascular disease)   . Carotid bruit   . History of renal calculi   . Diabetes mellitus without complication   . DOE (dyspnea on exertion)     2016  . Hypoxia     2016  . COPD (chronic obstructive pulmonary disease)   . Asthma     Past Surgical History  Procedure Laterality Date  . Left wrist    . Rotator cuff repair  1995    right   . Lithotripsy  2010  . 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 tubular adenomas    Family History  Problem Relation Age of Onset  . Diabetes Sister     History   Social History  . Marital Status: Married    Spouse Name: N/A  . Number of Children: N/A  . Years of Education: N/A   Occupational History  . Not on file.   Social History Main Topics  . Smoking status: Former Smoker -- 0.50 packs/day  Start date: 05/04/1964    Quit date: 02/09/2013  . Smokeless tobacco: Never Used  . Alcohol Use: No  . Drug Use: No  . Sexual Activity: Not on file   Other Topics Concern  . Not on file   Social History Narrative      ROS:  General: Negative for anorexia, weight loss, fever, chills, fatigue.+ weakness. Eyes: Negative for vision changes.  ENT: Negative for hoarseness, difficulty swallowing , nasal congestion. CV: Negative for chest pain, angina, palpitations. Positive dyspnea on exertion. No peripheral edema  Respiratory: Negative for dyspnea at rest, cough, sputum, wheezing. Positive dyspnea on exertion. GI: See  history of present illness. GU:  Negative for dysuria, hematuria, urinary incontinence, urinary frequency, nocturnal urination.  MS: Negative for joint pain, low back pain.  Derm: Negative for rash or itching.  Neuro: Negative for weakness, abnormal sensation, seizure, frequent headaches, memory loss, confusion.  Psych: Negative for anxiety, depression, suicidal ideation, hallucinations.  Endo: Negative for unusual weight change.  Heme: Negative for bruising or bleeding. Allergy: Negative for rash or hives.    Physical Examination:  BP 153/96 mmHg  Pulse 98  Temp(Src) 97.6 F (36.4 C)  Ht _0  (1.905 m)  Wt 225 lb 9.6 oz (102.331 kg)  BMI 28.20 kg/m2   General: Well-nourished, well-developed in no acute distress.  Head: Normocephalic, atraumatic.   Eyes: Conjunctiva pink, no icterus. Mouth: Oropharyngeal mucosa moist and pink , no lesions erythema or exudate. Neck: Supple without thyromegaly, masses, or lymphadenopathy.  Lungs: Clear to auscultation bilaterally.  Heart: Regular rate and rhythm, no murmurs rubs or gallops.  Abdomen: Bowel sounds are normal, nontender, nondistended, no hepatosplenomegaly or masses, no abdominal bruits or    hernia , no rebound or guarding.   Rectal: deferred Extremities: No lower extremity edema. No clubbing or deformities.  Neuro: Alert and oriented x 4 , grossly normal neurologically.  Skin: Warm and dry, no rash or jaundice.   Psych: Alert and cooperative, normal mood and affect.  Labs: Lab Results  Component Value Date   ALT 16 01/27/2015   AST 19 01/27/2015   ALKPHOS 52 01/27/2015   BILITOT 0.5 01/27/2015   Lab Results  Component Value Date   CREATININE 1.16 12/07/2014   BUN 15 12/07/2014   NA 139 12/07/2014   K 4.3 12/07/2014   CL 106 12/07/2014   CO2 25 12/07/2014   Lab Results  Component Value Date   WBC 6.5 12/07/2014   HGB 16.1 12/07/2014   HCT 50.1 12/07/2014   MCV 85.9 12/07/2014   PLT 206 12/07/2014      Imaging Studies: Nm Myocar Multi W/spect W/wall Motion / Ef  02/02/2015   CLINICAL DATA:  69 year old male with history of coronary atherosclerosis, hyperlipidemia, hypertension, and shortness of breath. This study is requested to evaluate for the presence and extent of ischemia.  EXAM: MYOCARDIAL IMAGING WITH SPECT (REST AND PHARMACOLOGIC-STRESS)  GATED LEFT VENTRICULAR WALL MOTION STUDY  LEFT VENTRICULAR EJECTION FRACTION  TECHNIQUE: Standard myocardial SPECT imaging was performed after resting intravenous injection of 10 mCi Tc-60msestamibi. Subsequently, intravenous infusion of Lexiscan was performed under the supervision of the Cardiology staff. At peak effect of the drug, 30 mCi Tc-93mestamibi was injected intravenously and standard myocardial SPECT imaging was performed. Quantitative gated imaging was also performed to evaluate left ventricular wall motion, and estimate left ventricular ejection fraction.  FINDINGS: Baseline tracing shows normal sinus rhythm at 91 beats per min with nonspecific T-wave changes and borderline  increased voltage. Lexiscan bolus was given in standard fashion. Heart rate increased from 90 beats per min up to 106 beats per min, and blood pressure increased from 182/108 up to 190/100. No chest pain was reported. There were no diagnostic ST segment abnormalities, and no arrhythmias were noted.  Analysis of the raw perfusion data finds adequate myocardial perfusion uptake.  Perfusion: No decreased activity in the left ventricle on stress imaging to suggest reversible ischemia or infarction.  Wall Motion: Normal left ventricular wall motion. No left ventricular dilation.  Left Ventricular Ejection Fraction: 61 %  End diastolic volume 81 ml  End systolic volume 32 ml  IMPRESSION: 1. No reversible ischemia or infarction.  2. Normal left ventricular wall motion.  3. Left ventricular ejection fraction 61%  4. Low-risk stress test findings*.  *2012 Appropriate Use Criteria for  Coronary Revascularization Focused Update: J Am Coll Cardiol. 8978;47(8):412-820. http://content.airportbarriers.com.aspx?articleid=1201161   Electronically Signed   By: Rozann Lesches M.D.   On: 02/02/2015 15:25

## 2015-03-06 NOTE — Progress Notes (Signed)
This note also relates to the following rows which could not be included: Temp - Cannot attach notes to unvalidated device data   Patient's oxygen sats falling below appropriate levels.  He will be continued on re-breather mask and monitored.

## 2015-03-06 NOTE — Op Note (Signed)
[facility] [address] [place], [address]   COLONOSCOPY PROCEDURE REPORT  PATIENT: [name], [name]  MR#: [id number] BIRTHDATE: 1946-01-01 , 57  yrs. old GENDER: male ENDOSCOPIST: R.  Garfield Cornea, MD FACP [facility] REFERRED HT:XHFSFSEL Moshe Cipro, M.D. PROCEDURE DATE:  03/10/15 PROCEDURE:   Colonoscopy with biopsy and Colonoscopy with snare polypectomy INDICATIONS:Surveillance examination; history of colonic adenoma.[name]  MEDICATIONS: Versed 6 mg IV and Demerol 100 mg IV in divided doses. Zofran 4 mg IV. ASA CLASS:       Class III  CONSENT: The risks, benefits, alternatives and imponderables including but not limited to bleeding, perforation as well as the possibility of a missed lesion have been reviewed.  The potential for biopsy, lesion removal, etc. have also been discussed. Questions have been answered.  All parties agreeable.  Please see the history and physical in the medical record for more information.  DESCRIPTION OF PROCEDURE:   After the risks benefits and alternatives of the procedure were thoroughly explained, informed consent was obtained.  The digital rectal exam revealed no abnormalities of the rectum.   The EC-[id number] ([id number])  endoscope was introduced through the anus and advanced to the cecum, which was identified by both the appendix and ileocecal valve. No adverse events experienced.   The quality of the prep was adequate  The instrument was then slowly withdrawn as the colon was fully examined.      COLON FINDINGS: Mild melanosis coli present.  (1) 3 mm polyp in at 5 cm from the anal verge; otherwise, the remainder of the colonic mucosa appeared normal.  Colonic mucosa abnormal?"melanosis coli. (1) cross?"shaped polyp in the mid ascending segment nearly 1 cm in dimension.  Otherwise, the remainder colonic mucosa.  Normal.  The above-mentioned polyps were cold biopsy and hot snare removed, respectively.  Retroflexion was not  performed. .  Withdrawal time=11 minutes.     .  The scope was withdrawn and the procedure completed. COMPLICATIONS: There were no immediate complications.  ENDOSCOPIC IMPRESSION: Melanosis coli. Multiple rectal and colonic polyps?"removed as described above  RECOMMENDATIONS: Follow up on path pathology  eSigned:  R. Garfield Cornea, MD Rosalita Chessman [facility] 03/10/2015 9:23 AM   cc:  CPT CODES: ICD CODES:  The ICD and CPT codes recommended by this software are interpretations from the data that the clinical staff has captured with the software.  The verification of the translation of this report to the ICD and CPT codes and modifiers is the sole responsibility of the health care institution and practicing physician where this report was generated.  [employer] will not be held responsible for the validity of the ICD and CPT codes included on this report.  AMA assumes no liability for data contained or not contained herein. CPT is a Designer, television/film set of the [employer].  PATIENT NAME:  [name], [name] MR#: [id number]

## 2015-03-06 NOTE — Discharge Instructions (Signed)
Colonoscopy Discharge Instructions  Read the instructions outlined below and refer to this sheet in the next few weeks. These discharge instructions provide you with general information on caring for yourself after you leave the hospital. Your doctor may also give you specific instructions. While your treatment has been planned according to the most current medical practices available, unavoidable complications occasionally occur. If you have any problems or questions after discharge, call Dr. Gala Romney at [phone]. ACTIVITY  You may resume your regular activity, but move at a slower pace for the next 24 hours.   Take frequent rest periods for the next 24 hours.   Walking will help get rid of the air and reduce the bloated feeling in your belly (abdomen).   No driving for 24 hours (because of the medicine (anesthesia) used during the test).    Do not sign any important legal documents or operate any machinery for 24 hours (because of the anesthesia used during the test).  NUTRITION  Drink plenty of fluids.   You may resume your normal diet as instructed by your doctor.   Begin with a light meal and progress to your normal diet. Heavy or fried foods are harder to digest and may make you feel sick to your stomach (nauseated).   Avoid alcoholic beverages for 24 hours or as instructed.  MEDICATIONS  You may resume your normal medications unless your doctor tells you otherwise.  WHAT YOU CAN EXPECT TODAY  Some feelings of bloating in the abdomen.   Passage of more gas than usual.   Spotting of blood in your stool or on the toilet paper.  IF YOU HAD POLYPS REMOVED DURING THE COLONOSCOPY:  No aspirin products for 7 days or as instructed.   No alcohol for 7 days or as instructed.   Eat a soft diet for the next 24 hours.  FINDING OUT THE RESULTS OF YOUR TEST Not all test results are available during your visit. If your test results are not back during the visit, make an appointment  with your caregiver to find out the results. Do not assume everything is normal if you have not heard from your caregiver or the medical facility. It is important for you to follow up on all of your test results.  SEEK IMMEDIATE MEDICAL ATTENTION IF:  You have more than a spotting of blood in your stool.   Your belly is swollen (abdominal distention).   You are nauseated or vomiting.   You have a temperature over 101.   You have abdominal pain or discomfort that is severe or gets worse throughout the day.    Polyp information provided  Further recommendations to follow pending review of pathology report  Colon Polyps Polyps are lumps of extra tissue growing inside the body. Polyps can grow in the large intestine (colon). Most colon polyps are noncancerous (benign). However, some colon polyps can become cancerous over time. Polyps that are larger than a pea may be harmful. To be safe, caregivers remove and test all polyps. CAUSES  Polyps form when mutations in the genes cause your cells to grow and divide even though no more tissue is needed. RISK FACTORS There are a number of risk factors that can increase your chances of getting colon polyps. They include:  Being older than 50 years.  Family history of colon polyps or colon cancer.  Long-term colon diseases, such as colitis or Crohn disease.  Being overweight.  Smoking.  Being inactive.  Drinking too much alcohol. SYMPTOMS  Most small polyps do not cause symptoms. If symptoms are present, they may include:  Blood in the stool. The stool may look dark red or black.  Constipation or diarrhea that lasts longer than 1 week. DIAGNOSIS People often do not know they have polyps until their caregiver finds them during a regular checkup. Your caregiver can use 4 tests to check for polyps:  Digital rectal exam. The caregiver wears gloves and feels inside the rectum. This test would find polyps only in the rectum.  Barium  enema. The caregiver puts a liquid called barium into your rectum before taking X-rays of your colon. Barium makes your colon look white. Polyps are dark, so they are easy to see in the X-ray pictures.  Sigmoidoscopy. A thin, flexible tube (sigmoidoscope) is placed into your rectum. The sigmoidoscope has a light and tiny camera in it. The caregiver uses the sigmoidoscope to look at the last third of your colon.  Colonoscopy. This test is like sigmoidoscopy, but the caregiver looks at the entire colon. This is the most common method for finding and removing polyps. TREATMENT  Any polyps will be removed during a sigmoidoscopy or colonoscopy. The polyps are then tested for cancer. PREVENTION  To help lower your risk of getting more colon polyps:  Eat plenty of fruits and vegetables. Avoid eating fatty foods.  Do not smoke.  Avoid drinking alcohol.  Exercise every day.  Lose weight if recommended by your caregiver.  Eat plenty of calcium and folate. Foods that are rich in calcium include milk, cheese, and broccoli. Foods that are rich in folate include chickpeas, kidney beans, and spinach. HOME CARE INSTRUCTIONS Keep all follow-up appointments as directed by your caregiver. You may need periodic exams to check for polyps. SEEK MEDICAL CARE IF: You notice bleeding during a bowel movement. Document Released: 07/24/2004 Document Revised: 01/20/2012 Document Reviewed: 01/07/2012 [facility] Patient Information 2015 [place], [place]. This information is not intended to replace advice given to you by your health care provider. Make sure you discuss any questions you have with your health care provider.

## 2015-03-07 ENCOUNTER — Encounter (HOSPITAL_COMMUNITY): Payer: Self-pay | Admitting: Internal Medicine

## 2015-03-07 ENCOUNTER — Encounter: Payer: Self-pay | Admitting: Internal Medicine

## 2015-03-10 DIAGNOSIS — Z86010 Personal history of colonic polyps: Secondary | ICD-10-CM | POA: Insufficient documentation

## 2015-04-05 ENCOUNTER — Encounter: Payer: Self-pay | Admitting: Family Medicine

## 2015-04-05 ENCOUNTER — Ambulatory Visit (INDEPENDENT_AMBULATORY_CARE_PROVIDER_SITE_OTHER): Payer: Medicare Other | Admitting: Family Medicine

## 2015-04-05 VITALS — BP 160/92 | HR 91 | Resp 16 | Ht 75.0 in | Wt 224.0 lb

## 2015-04-05 DIAGNOSIS — E1165 Type 2 diabetes mellitus with hyperglycemia: Secondary | ICD-10-CM

## 2015-04-05 DIAGNOSIS — I10 Essential (primary) hypertension: Secondary | ICD-10-CM | POA: Diagnosis not present

## 2015-04-05 DIAGNOSIS — E785 Hyperlipidemia, unspecified: Secondary | ICD-10-CM

## 2015-04-05 DIAGNOSIS — E559 Vitamin D deficiency, unspecified: Secondary | ICD-10-CM

## 2015-04-05 DIAGNOSIS — E1129 Type 2 diabetes mellitus with other diabetic kidney complication: Secondary | ICD-10-CM

## 2015-04-05 DIAGNOSIS — R0609 Other forms of dyspnea: Secondary | ICD-10-CM

## 2015-04-05 DIAGNOSIS — IMO0002 Reserved for concepts with insufficient information to code with codable children: Secondary | ICD-10-CM

## 2015-04-05 LAB — HEMOGLOBIN A1C
A1c: 8.6
LDL: 87

## 2015-04-05 MED ORDER — ERGOCALCIFEROL 1.25 MG (50000 UT) PO CAPS: 50000.0000 [IU] | ORAL_CAPSULE | ORAL | Status: DC

## 2015-04-05 NOTE — Patient Instructions (Addendum)
Annual physical exam in 4.5 month, call if you need me sooner  BP is high, call nurse next Tuesday if you get it high  At home I will add additional medication  BP should be 130/80  Nurse BP check in 3 weeks  HBA1C, chem 7  and EGFR and TSH August 3 or after, copy to Dr Dorris Fetch  Start once weekly vit D  Make August appt with Dr Dorris Fetch and keep please  Will follow up on info from [facility] specialist and may need to refer you to [facility], as discussed  Please work on good  health habits so that your health will improve. 1. Commitment to daily physical activity for 30 to 60  minutes, if you are able to do this.  2. Commitment to wise food choices. Aim for half of your  food intake to be vegetable and fruit, one quarter starchy foods, and one quarter protein. Try to eat on a regular schedule  3 meals per day, snacking between meals should be limited to vegetables or fruits or small portions of nuts. 64 ounces of water per day is generally recommended, unless you have specific health conditions, like heart failure or kidney failure where you will need to limit fluid intake.  3. Commitment to sufficient and a  good quality of physical and mental rest daily, generally between 6 to 8 hours per day.  WITH PERSISTANCE AND PERSEVERANCE, THE IMPOSSIBLE , BECOMES THE NORM!  Thanks for choosing [facility], we consider it a privelige to serve you.

## 2015-04-05 NOTE — Progress Notes (Signed)
Subjective:    Patient ID: [name], male    DOB: October 18, 1946, 69 y.o.   MRN: [id number]  HPI The PT is here for follow up and re-evaluation of chronic medical conditions, medication management and review of any available recent lab and radiology data.  Preventive health is updated, specifically  Cancer screening and Immunization.   Questions or concerns regarding consultations or procedures which the PT has had in the interim are  Addressed.Alot of follow up needed on his pulmonary eval  The PT denies any adverse reactions to current medications since the last visit.  Improving as far as breathing is concerned though still uses oxygen at night, and as neede,d Denies polyuria, polydipsia, blurred vision , or hypoglycemic episodes. Recently seen at the [facility] by his ,medical Doc and labs are improved       Review of Systems See HPI Denies recent fever or chills. Denies sinus pressure, ear pain or sore throat.Slight increase in nasal congestion C/o dry cough  Denies chest pains, palpitations and leg swelling Denies abdominal pain, nausea, vomiting,diarrhea or constipation.   Denies dysuria, frequency, hesitancy or incontinence. Denies joint pain, swelling and limitation in mobility. Denies headaches, seizures, numbness, or tingling. Denies depression, anxiety or insomnia. Denies skin break down or rash.        Objective:   Physical Exam  BP 160/92 mmHg  Pulse 91  Resp 16  Ht _0  (1.905 m)  Wt 224 lb (101.606 kg)  BMI 28.00 kg/m2  SpO2 92% Patient alert and oriented and in no cardiopulmonary distress.  HEENT: No facial asymmetry, EOMI,   oropharynx pink and moist.  Neck supple no JVD, no mass.  Chest: Clear to auscultation bilaterally.Markedly decreased air entry   CVS: S1, S2 no murmurs, no S3.Regular rate.  ABD: Soft non tender.   Ext: No edema  MS: Adequate ROM spine, shoulders, hips and knees.  Skin: Intact, no ulcerations or rash noted.  Psych:  Good eye contact, normal affect. Memory intact not anxious or depressed appearing.  CNS: CN 2-12 intact, power,  normal throughout.no focal deficits noted.       Assessment & Plan:  Essential hypertension Uncontrolled at visit, pt insists that he has normal BP at home, he will return with meter for nurse BP check in  3 weeks DASH diet and commitment to daily physical activity for a minimum of 30 minutes discussed and encouraged, as a part of hypertension management. The importance of attaining a healthy weight is also discussed.  BP/Weight 04/05/2015 03/06/2015 02/07/2015 01/27/2015 01/02/2015 12/07/2014 9/75/8832  Systolic BP 549 826 415 830 940 768 -  Diastolic BP 92 64 96 86 80 70 -  Wt. (Lbs) 224 - 225.6 225 226 - 229  BMI 28 - 28.2 28.12 28.25 - 28.62         Hyperlipidemia with target LDL less than 100 Hyperlipidemia:Low fat diet discussed and encouraged. Elevated TG s and low HDL, increase activity as able   Lipid Panel  Lab Results  Component Value Date   CHOL 133 01/27/2015   HDL 31* 01/27/2015   LDLCALC 65 01/27/2015   TRIG 184* 01/27/2015   CHOLHDL 4.3 01/27/2015        DOE (dyspnea on exertion) Improved but still needs to use supplemental oxygen at times, need to follow up on pulmonary eval which has started, pt no states he would like to go to [place] as  Has not been evaluated by MD yet and seems to  be having re testing done of tests done a [facility], will need to f/u on ths  Vitamin D deficiency Weekly vit D prescribed  DM (diabetes mellitus), type 2, uncontrolled, with renal complications Improving and treated by endo Importance of testing and dietary control is stressed Lab update due 06/2015 and needs to continue to follow with local endo

## 2015-04-11 ENCOUNTER — Telehealth: Payer: Self-pay | Admitting: Family Medicine

## 2015-04-11 NOTE — Telephone Encounter (Signed)
Patient was to call back this week with reading and additional med would be added if blood pressure still high.  He is to be coming in for nurse visit in 2 weeks.

## 2015-04-11 NOTE — Telephone Encounter (Signed)
Still needs to have nuirse BP check in 2 weeks , bring  His meter and his medication, let him know please

## 2015-04-11 NOTE — Telephone Encounter (Signed)
Patient aware and will bring meter and medications.

## 2015-04-17 ENCOUNTER — Other Ambulatory Visit (HOSPITAL_COMMUNITY): Payer: Self-pay | Admitting: Pulmonary Disease

## 2015-04-17 DIAGNOSIS — R599 Enlarged lymph nodes, unspecified: Secondary | ICD-10-CM

## 2015-04-21 ENCOUNTER — Ambulatory Visit (HOSPITAL_COMMUNITY): Payer: Medicare Other

## 2015-04-22 NOTE — Assessment & Plan Note (Signed)
Improving and treated by endo Importance of testing and dietary control is stressed Lab update due 06/2015 and needs to continue to follow with local endo

## 2015-04-22 NOTE — Assessment & Plan Note (Signed)
Weekly vit D prescribed

## 2015-04-22 NOTE — Assessment & Plan Note (Signed)
Improved but still needs to use supplemental oxygen at times, need to follow up on pulmonary eval which has started, pt no states he would like to go to [place] as  Has not been evaluated by MD yet and seems to be having re testing done of tests done a [facility], will need to f/u on ths

## 2015-04-22 NOTE — Assessment & Plan Note (Signed)
Hyperlipidemia:Low fat diet discussed and encouraged. Elevated TG s and low HDL, increase activity as able   Lipid Panel  Lab Results  Component Value Date   CHOL 133 01/27/2015   HDL 31* 01/27/2015   LDLCALC 65 01/27/2015   TRIG 184* 01/27/2015   CHOLHDL 4.3 01/27/2015

## 2015-04-22 NOTE — Assessment & Plan Note (Signed)
Uncontrolled at visit, pt insists that he has normal BP at home, he will return with meter for nurse BP check in  3 weeks DASH diet and commitment to daily physical activity for a minimum of 30 minutes discussed and encouraged, as a part of hypertension management. The importance of attaining a healthy weight is also discussed.  BP/Weight 04/05/2015 03/06/2015 02/07/2015 01/27/2015 01/02/2015 12/07/2014 1/60/7371  Systolic BP 062 694 854 627 035 009 -  Diastolic BP 92 64 96 86 80 70 -  Wt. (Lbs) 224 - 225.6 225 226 - 229  BMI 28 - 28.2 28.12 28.25 - 28.62

## 2015-04-25 ENCOUNTER — Ambulatory Visit (HOSPITAL_COMMUNITY)
Admission: RE | Admit: 2015-04-25 | Discharge: 2015-04-25 | Disposition: A | Discharge status: Home / Self Care | Payer: Medicare Other | Source: Ambulatory Visit | Attending: Pulmonary Disease | Admitting: Pulmonary Disease

## 2015-04-25 DIAGNOSIS — R0602 Shortness of breath: Secondary | ICD-10-CM | POA: Diagnosis not present

## 2015-04-25 DIAGNOSIS — R591 Generalized enlarged lymph nodes: Secondary | ICD-10-CM | POA: Diagnosis not present

## 2015-04-25 DIAGNOSIS — R599 Enlarged lymph nodes, unspecified: Secondary | ICD-10-CM

## 2015-04-25 DIAGNOSIS — J439 Emphysema, unspecified: Secondary | ICD-10-CM | POA: Diagnosis not present

## 2015-04-25 LAB — POCT I-STAT CREATININE: Creatinine, Ser: 1.4 mg/dL — ABNORMAL HIGH (ref 0.61–1.24)

## 2015-04-25 MED ORDER — IOHEXOL 300 MG/ML  SOLN
75.0000 mL | Freq: Once | INTRAMUSCULAR | Status: AC | PRN
  Administered 2015-04-25: 75 mL via INTRAVENOUS

## 2015-04-26 ENCOUNTER — Ambulatory Visit: Payer: Medicare Other

## 2015-04-26 VITALS — BP 186/92

## 2015-04-26 DIAGNOSIS — I10 Essential (primary) hypertension: Secondary | ICD-10-CM

## 2015-04-26 MED ORDER — AMLODIPINE BESYLATE 2.5 MG PO TABS
2.5000 mg | ORAL_TABLET | Freq: Every day | ORAL | Status: DC
Start: 2015-04-26 — End: 2015-08-07

## 2015-04-26 NOTE — Patient Instructions (Signed)
Return in 2 weeks for office visit.   An additional blood pressure medication is to be started  Amlodipine 2.36m once a day  You need oxygen all the time when you are up and about !

## 2015-04-26 NOTE — Progress Notes (Signed)
Patient in for nurse blood pressure check.  Blood pressure elevated and rechecked by MD.  New order written and explained to patient.  Patient will return on 6/30 for office visit with review of medications.  Patient is requesting that a certain type of portable oxygen be ordered from [facility] charges.  He is also asking for a handicap sticker.

## 2015-05-03 ENCOUNTER — Telehealth: Payer: Self-pay | Admitting: *Deleted

## 2015-05-03 NOTE — Telephone Encounter (Signed)
Dr. Vella Kohler nurse called regarding pt in office at this time and would like to talk to Dr. Harl Bowie about possible RHC. Will forward to Dr. Harl Bowie

## 2015-05-11 ENCOUNTER — Ambulatory Visit (INDEPENDENT_AMBULATORY_CARE_PROVIDER_SITE_OTHER): Payer: Medicare Other | Admitting: Family Medicine

## 2015-05-11 ENCOUNTER — Encounter: Payer: Self-pay | Admitting: Family Medicine

## 2015-05-11 VITALS — BP 138/84 | HR 97 | Temp 97.4°F | Resp 16 | Ht 75.0 in

## 2015-05-11 DIAGNOSIS — D126 Benign neoplasm of colon, unspecified: Secondary | ICD-10-CM

## 2015-05-11 DIAGNOSIS — R599 Enlarged lymph nodes, unspecified: Secondary | ICD-10-CM

## 2015-05-11 DIAGNOSIS — I10 Essential (primary) hypertension: Secondary | ICD-10-CM | POA: Diagnosis not present

## 2015-05-11 DIAGNOSIS — Z86010 Personal history of colonic polyps: Secondary | ICD-10-CM

## 2015-05-11 DIAGNOSIS — E785 Hyperlipidemia, unspecified: Secondary | ICD-10-CM

## 2015-05-11 DIAGNOSIS — E1165 Type 2 diabetes mellitus with hyperglycemia: Secondary | ICD-10-CM

## 2015-05-11 DIAGNOSIS — R590 Localized enlarged lymph nodes: Secondary | ICD-10-CM

## 2015-05-11 DIAGNOSIS — N3000 Acute cystitis without hematuria: Secondary | ICD-10-CM

## 2015-05-11 DIAGNOSIS — IMO0002 Reserved for concepts with insufficient information to code with codable children: Secondary | ICD-10-CM

## 2015-05-11 DIAGNOSIS — R0902 Hypoxemia: Secondary | ICD-10-CM | POA: Diagnosis not present

## 2015-05-11 DIAGNOSIS — E1129 Type 2 diabetes mellitus with other diabetic kidney complication: Secondary | ICD-10-CM | POA: Diagnosis not present

## 2015-05-11 NOTE — Patient Instructions (Signed)
F/u in 8 weeks, call if you need me before  Please sign for note from lung specialist and i will work with him as best able to co ordinate care with Dr Harl Bowie  Urine checked today looks normal  Labs today at hospital since you c/o chills for the past 1 week, and will also check on your kidneys at the same time, CBc, chem 7 and EGFr   I hope that you continue to feel better

## 2015-05-12 DIAGNOSIS — N3000 Acute cystitis without hematuria: Secondary | ICD-10-CM | POA: Insufficient documentation

## 2015-05-12 DIAGNOSIS — D126 Benign neoplasm of colon, unspecified: Secondary | ICD-10-CM | POA: Insufficient documentation

## 2015-05-12 NOTE — Assessment & Plan Note (Signed)
Hyperlipidemia:Low fat diet discussed and encouraged.   Lipid Panel  Lab Results  Component Value Date   CHOL 133 01/27/2015   HDL 31* 01/27/2015   LDLCALC 65 01/27/2015   TRIG 184* 01/27/2015   CHOLHDL 4.3 01/27/2015   Needs to reduce fried and fatty food due to elevated TG

## 2015-05-12 NOTE — Assessment & Plan Note (Signed)
Reports improved slightly,  Believe with the use of his oxygen continually

## 2015-05-12 NOTE — Assessment & Plan Note (Signed)
Most recent test shows mild deterioration, however he has been on high doses of steroids for lung disease in recent times also [name] is reminded of the importance of commitment to daily physical activity for 30 minutes or more, as able and the need to limit carbohydrate intake to 30 to 60 grams per meal to help with blood sugar control.   The need to take medication as prescribed, test blood sugar as directed, and to call between visits if there is a concern that blood sugar is uncontrolled is also discussed.   [name] is reminded of the importance of daily foot exam, annual eye examination, and good blood sugar, blood pressure and cholesterol control.  Diabetic Labs Latest Ref Rng 04/25/2015 01/27/2015 12/07/2014 12/06/2014 06/03/2014  HbA1c <5.7 % - - - 8.4(H) 9.2  Microalbumin 0.00 - 1.89 mg/dL - - - - -  Micro/Creat Ratio 0.0 - 30.0 mg/g - - - - -  Chol 0 - 200 mg/dL - 133 - - -  HDL >=40 mg/dL - 31(L) - - -  Calc LDL 0 - 99 mg/dL - 65 - - 82  Triglycerides <150 mg/dL - 184(H) - - -  Creatinine 0.61 - 1.24 mg/dL 1.40(H) - 1.16 1.18 -   BP/Weight 05/11/2015 04/26/2015 04/05/2015 03/06/2015 02/07/2015 01/27/2015 8/93/7342  Systolic BP 876 811 572 620 355 974 163  Diastolic BP 84 92 92 64 96 86 80  Wt. (Lbs) - - 224 - 225.6 225 226  BMI - - 28 - 28.2 28.12 28.25   Foot/eye exam completion dates Latest Ref Rng 07/12/2014 06/14/2014  Eye Exam No Retinopathy No Retinopathy -  Foot Form Completion - - Done

## 2015-05-12 NOTE — Assessment & Plan Note (Signed)
C/o chills and brown urine x 1 week, UA negative for blood or infection, after pt heard this, stated he was getting better and states poor appetite with reduced fluid intake was responsible

## 2015-05-12 NOTE — Assessment & Plan Note (Signed)
Improved and adequately controlled, no change in meds

## 2015-05-12 NOTE — Assessment & Plan Note (Signed)
Current primary diagnosis by pulmonologist who evaluated pt in 04/2015, needs ongoing f/u , has appt in 2 weeks

## 2015-05-12 NOTE — Assessment & Plan Note (Signed)
Need for continuos oxygen , exact pathology currently unclear, will send for pulmonary consult note and follow up on recommendations

## 2015-05-12 NOTE — Assessment & Plan Note (Signed)
Needs rept colonoscopy in 02/2018
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
## 2015-05-22 LAB — POCT URINALYSIS DIPSTICK
BILIRUBIN UA: NEGATIVE
Blood, UA: NEGATIVE
Glucose, UA: NEGATIVE
KETONES UA: NEGATIVE
Leukocytes, UA: NEGATIVE
NITRITE UA: NEGATIVE
PROTEIN UA: 100
SPEC GRAV UA: 1.025
Urobilinogen, UA: 1
pH, UA: 5.5

## 2015-05-22 NOTE — Addendum Note (Signed)
Addended by: Eual Fines on: 05/22/2015 09:10 AM   Modules accepted: Orders

## 2015-05-23 ENCOUNTER — Telehealth: Payer: Self-pay | Admitting: Cardiology

## 2015-05-23 NOTE — Telephone Encounter (Signed)
Discussed patient with his pulmonologist Dr Raul Del with [facility]. Thoughts are that his dyspnea is out of proportion to pulmonary testing results. From cardiac standpoint normal LVEF, negative Lexiscan. Echo did show PASP in the 50s, pulmonary has requested a RHC to further evaluate degree of pulmonary hypertension. We will arrange to be done at [facility], results will need to be sent to Dr Raul Del as well once complete.   Zandra Abts MD

## 2015-05-24 ENCOUNTER — Other Ambulatory Visit: Payer: Self-pay | Admitting: Internal Medicine

## 2015-05-24 DIAGNOSIS — R0602 Shortness of breath: Secondary | ICD-10-CM

## 2015-05-25 ENCOUNTER — Encounter: Payer: Self-pay | Admitting: *Deleted

## 2015-05-25 ENCOUNTER — Telehealth: Payer: Self-pay | Admitting: *Deleted

## 2015-05-25 DIAGNOSIS — Z01818 Encounter for other preprocedural examination: Secondary | ICD-10-CM

## 2015-05-25 NOTE — Telephone Encounter (Signed)
-----  Message from Arnoldo Lenis, MD sent at 05/24/2015  2:56 PM EDT ----- T[place], please see below regarding RHC   Zandra Abts MD ----- Message -----    From: Laurine Blazer, LPN    Sent: 0/17/2419   5:08 PM      To: Arnoldo Lenis, MD  This is a [facility] patient, did you really intend for [place] to do ??  Just making sure.  Thanks, Edd Fabian   ----- Message -----    From: Arnoldo Lenis, MD    Sent: 05/23/2015   2:47 PM      To: Laurine Blazer, LPN  Please set patient up for right heart cath for pulmonary hypertension. He does not need to hold any meds. Any provider is fine.    Zandra Abts MD

## 2015-05-30 ENCOUNTER — Other Ambulatory Visit: Payer: Self-pay | Admitting: Cardiology

## 2015-05-30 DIAGNOSIS — I272 Pulmonary hypertension, unspecified: Secondary | ICD-10-CM

## 2015-05-31 ENCOUNTER — Ambulatory Visit (HOSPITAL_COMMUNITY)
Admission: RE | Admit: 2015-05-31 | Discharge: 2015-05-31 | Disposition: A | Discharge status: Home / Self Care | Payer: Medicare Other | Source: Ambulatory Visit | Attending: Cardiovascular Disease | Admitting: Cardiovascular Disease

## 2015-05-31 ENCOUNTER — Encounter (HOSPITAL_COMMUNITY): Payer: Self-pay | Admitting: Cardiovascular Disease

## 2015-05-31 ENCOUNTER — Ambulatory Visit: Payer: Medicare Other

## 2015-05-31 ENCOUNTER — Encounter (HOSPITAL_COMMUNITY)
Admission: RE | Disposition: A | Discharge status: Home / Self Care | Payer: Medicare Other | Source: Ambulatory Visit | Attending: Cardiovascular Disease

## 2015-05-31 DIAGNOSIS — N529 Male erectile dysfunction, unspecified: Secondary | ICD-10-CM | POA: Insufficient documentation

## 2015-05-31 DIAGNOSIS — Z87891 Personal history of nicotine dependence: Secondary | ICD-10-CM | POA: Diagnosis not present

## 2015-05-31 DIAGNOSIS — J45909 Unspecified asthma, uncomplicated: Secondary | ICD-10-CM | POA: Insufficient documentation

## 2015-05-31 DIAGNOSIS — E119 Type 2 diabetes mellitus without complications: Secondary | ICD-10-CM | POA: Insufficient documentation

## 2015-05-31 DIAGNOSIS — I5189 Other ill-defined heart diseases: Secondary | ICD-10-CM | POA: Insufficient documentation

## 2015-05-31 DIAGNOSIS — I270 Primary pulmonary hypertension: Secondary | ICD-10-CM

## 2015-05-31 DIAGNOSIS — I739 Peripheral vascular disease, unspecified: Secondary | ICD-10-CM | POA: Insufficient documentation

## 2015-05-31 DIAGNOSIS — I272 Other secondary pulmonary hypertension: Secondary | ICD-10-CM | POA: Insufficient documentation

## 2015-05-31 DIAGNOSIS — J449 Chronic obstructive pulmonary disease, unspecified: Secondary | ICD-10-CM | POA: Insufficient documentation

## 2015-05-31 DIAGNOSIS — R0609 Other forms of dyspnea: Secondary | ICD-10-CM | POA: Diagnosis present

## 2015-05-31 DIAGNOSIS — I10 Essential (primary) hypertension: Secondary | ICD-10-CM | POA: Insufficient documentation

## 2015-05-31 DIAGNOSIS — Z7982 Long term (current) use of aspirin: Secondary | ICD-10-CM | POA: Diagnosis not present

## 2015-05-31 DIAGNOSIS — E785 Hyperlipidemia, unspecified: Secondary | ICD-10-CM | POA: Insufficient documentation

## 2015-05-31 DIAGNOSIS — Z87442 Personal history of urinary calculi: Secondary | ICD-10-CM | POA: Insufficient documentation

## 2015-05-31 DIAGNOSIS — Z794 Long term (current) use of insulin: Secondary | ICD-10-CM | POA: Diagnosis not present

## 2015-05-31 HISTORY — PX: CARDIAC CATHETERIZATION: SHX172

## 2015-05-31 LAB — POCT I-STAT 3, VENOUS BLOOD GAS (G3P V)
ACID-BASE DEFICIT: 5 mmol/L — AB (ref 0.0–2.0)
Acid-base deficit: 4 mmol/L — ABNORMAL HIGH (ref 0.0–2.0)
Acid-base deficit: 5 mmol/L — ABNORMAL HIGH (ref 0.0–2.0)
BICARBONATE: 20.1 meq/L (ref 20.0–24.0)
Bicarbonate: 20.5 mEq/L (ref 20.0–24.0)
Bicarbonate: 21.4 mEq/L (ref 20.0–24.0)
O2 SAT: 58 %
O2 SAT: 60 %
O2 Saturation: 63 %
PCO2 VEN: 35.8 mmHg — AB (ref 45.0–50.0)
PCO2 VEN: 38.9 mmHg — AB (ref 45.0–50.0)
PH VEN: 7.331 — AB (ref 7.250–7.300)
TCO2: 21 mmol/L (ref 0–100)
TCO2: 22 mmol/L (ref 0–100)
TCO2: 23 mmol/L (ref 0–100)
pCO2, Ven: 37.8 mmHg — ABNORMAL LOW (ref 45.0–50.0)
pH, Ven: 7.358 — ABNORMAL HIGH (ref 7.250–7.300)
pH, Ven: 7.362 — ABNORMAL HIGH (ref 7.250–7.300)
pO2, Ven: 31 mmHg (ref 30.0–45.0)
pO2, Ven: 32 mmHg (ref 30.0–45.0)
pO2, Ven: 35 mmHg (ref 30.0–45.0)

## 2015-05-31 LAB — BASIC METABOLIC PANEL
Anion gap: 11 (ref 5–15)
BUN: 17 mg/dL (ref 6–20)
CHLORIDE: 104 mmol/L (ref 101–111)
CO2: 22 mmol/L (ref 22–32)
CREATININE: 1.4 mg/dL — AB (ref 0.61–1.24)
Calcium: 9.3 mg/dL (ref 8.9–10.3)
GFR calc Af Amer: 57 mL/min — ABNORMAL LOW (ref >60)
GFR calc non Af Amer: 49 mL/min — ABNORMAL LOW (ref >60)
GLUCOSE: 159 mg/dL — AB (ref 65–99)
POTASSIUM: 3.8 mmol/L (ref 3.5–5.1)
Sodium: 137 mmol/L (ref 135–145)

## 2015-05-31 LAB — CBC
HEMATOCRIT: 47.9 % (ref 39.0–52.0)
Hemoglobin: 16.2 g/dL (ref 13.0–17.0)
MCH: 28.4 pg (ref 26.0–34.0)
MCHC: 33.8 g/dL (ref 30.0–36.0)
MCV: 83.9 fL (ref 78.0–100.0)
PLATELETS: 156 10*3/uL (ref 150–400)
RBC: 5.71 MIL/uL (ref 4.22–5.81)
RDW: 15.1 % (ref 11.5–15.5)
WBC: 5.4 10*3/uL (ref 4.0–10.5)

## 2015-05-31 LAB — GLUCOSE, CAPILLARY
GLUCOSE-CAPILLARY: 136 mg/dL — AB (ref 65–99)
Glucose-Capillary: 135 mg/dL — ABNORMAL HIGH (ref 65–99)

## 2015-05-31 LAB — PROTIME-INR
INR: 1.08 (ref 0.00–1.49)
Prothrombin Time: 14.2 seconds (ref 11.6–15.2)

## 2015-05-31 SURGERY — RIGHT HEART CATH

## 2015-05-31 MED ORDER — SODIUM CHLORIDE 0.9 % IV SOLN: INTRAVENOUS | Status: DC

## 2015-05-31 MED ORDER — FENTANYL CITRATE (PF) 100 MCG/2ML IJ SOLN
INTRAMUSCULAR | Status: DC | PRN
  Administered 2015-05-31: 25 ug via INTRAVENOUS

## 2015-05-31 MED ORDER — SODIUM CHLORIDE 0.9 % IV SOLN
INTRAVENOUS | Status: DC
Start: 2015-06-01 — End: 2015-05-31
  Administered 2015-05-31: 07:00:00 via INTRAVENOUS

## 2015-05-31 MED ORDER — SODIUM CHLORIDE 0.9 % IJ SOLN: 3.0000 mL | Freq: Two times a day (BID) | INTRAMUSCULAR | Status: DC

## 2015-05-31 MED ORDER — SODIUM CHLORIDE 0.9 % IJ SOLN: 3.0000 mL | INTRAMUSCULAR | Status: DC | PRN

## 2015-05-31 MED ORDER — LIDOCAINE HCL (PF) 1 % IJ SOLN
INTRAMUSCULAR | Status: AC
  Filled 2015-05-31: qty 30

## 2015-05-31 MED ORDER — ACETAMINOPHEN 325 MG PO TABS: 650.0000 mg | ORAL_TABLET | ORAL | Status: DC | PRN

## 2015-05-31 MED ORDER — SODIUM CHLORIDE 0.9 % IV SOLN: 250.0000 mL | INTRAVENOUS | Status: DC | PRN

## 2015-05-31 MED ORDER — ONDANSETRON HCL 4 MG/2ML IJ SOLN: 4.0000 mg | Freq: Four times a day (QID) | INTRAMUSCULAR | Status: DC | PRN

## 2015-05-31 MED ORDER — FENTANYL CITRATE (PF) 100 MCG/2ML IJ SOLN
INTRAMUSCULAR | Status: AC
  Filled 2015-05-31: qty 2

## 2015-05-31 MED ORDER — ASPIRIN 81 MG PO CHEW
CHEWABLE_TABLET | ORAL | Status: AC
  Administered 2015-05-31: 81 mg
  Filled 2015-05-31: qty 1

## 2015-05-31 MED ORDER — HEPARIN (PORCINE) IN NACL 2-0.9 UNIT/ML-% IJ SOLN
INTRAMUSCULAR | Status: AC
  Filled 2015-05-31: qty 1000

## 2015-05-31 MED ORDER — MIDAZOLAM HCL 2 MG/2ML IJ SOLN
INTRAMUSCULAR | Status: DC | PRN
  Administered 2015-05-31: 1 mg via INTRAVENOUS

## 2015-05-31 MED ORDER — MIDAZOLAM HCL 2 MG/2ML IJ SOLN
INTRAMUSCULAR | Status: AC
  Filled 2015-05-31: qty 2

## 2015-05-31 SURGICAL SUPPLY — 8 items
CATH BALLN WEDGE 5F 110CM (CATHETERS) ×3 IMPLANT
GUIDEWIRE .025 260CM (WIRE) ×3 IMPLANT
KIT HEART RIGHT NAMIC (KITS) ×3 IMPLANT
PACK CARDIAC CATHETERIZATION (CUSTOM PROCEDURE TRAY) ×3 IMPLANT
PROTECTION STATION PRESSURIZED (MISCELLANEOUS) ×3
SHEATH FAST CATH BRACH 5F 5CM (SHEATH) ×3 IMPLANT
STATION PROTECTION PRESSURIZED (MISCELLANEOUS) ×1 IMPLANT
TRANSDUCER W/STOPCOCK (MISCELLANEOUS) ×3 IMPLANT

## 2015-05-31 NOTE — H&P (Signed)
History and Physical  Patient ID: [name] MRN: [id number], SOB: 10/22/1946 69 y.o. Date of Encounter: 05/31/2015, 8:17 AM  Primary Physician: Tula Nakayama, MD Primary Cardiologist: Dr Harl Bowie  Chief Complaint: Shortness of breath  HPI: 69 y.o. male w/ PMHx significant for heavy tobacco abuse who presented to [facility] on 05/31/2015 with complaints of shortness of breath.  The patient was seen by Dr. branch a few months ago. At that time he was evaluated with an echocardiogram and nuclear stress test. There was no ischemia demonstrated by nuclear stress testing. LV function is normal. The echocardiogram demonstrated RV dilatation with normal RV function, as well as pulmonary hypertension.  The patient's progressive shortness of breath with activity is felt to be out of proportion to his lung disease. He quit smoking 1 year ago. He presents today for right heart catheterization at the recommendation of his pulmonologist and cardiologist. The patient specifically denies chest pain or pressure at rest or with physical exertion. He has no orthopnea, PND, or leg swelling.   Past Medical History  Diagnosis Date  . Hyperlipidemia   . Hypertension   . ED (erectile dysfunction)   . PVD (peripheral vascular disease)   . Carotid bruit   . History of renal calculi   . Diabetes mellitus without complication   . DOE (dyspnea on exertion)     2016  . Hypoxia     2016  . COPD (chronic obstructive pulmonary disease)   . Asthma      Surgical History:  Past Surgical History  Procedure Laterality Date  . Left wrist    . Rotator cuff repair  1995    right   . Lithotripsy  2010  . 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 tubular adenomas  . Colonoscopy N/A 03/06/2015    Procedure: COLONOSCOPY;  Surgeon: Daneil Dolin, MD;  Location: [facility];  Service:  Endoscopy;  Laterality: N/A;  830      Home Meds: Prior to Admission medications   Medication Sig Start Date End Date Taking? Authorizing Provider  amLODipine (NORVASC) 2.5 MG tablet Take 1 tablet (2.5 mg total) by mouth daily. 04/26/15  Yes Fayrene Helper, MD  aspirin EC 81 MG tablet Take 81 mg by mouth daily.   Yes Historical Provider, MD  ergocalciferol (VITAMIN D2) 50000 UNITS capsule Take 1 capsule (50,000 Units total) by mouth once a week. One capsule once weekly 04/05/15  Yes Fayrene Helper, MD  fluticasone [facility]) 50 MCG/ACT nasal spray Place 2 sprays into both nostrils daily. 01/17/14  Yes Fayrene Helper, MD  insulin aspart (NOVOLOG) 100 UNIT/ML injection Inject 8-14 Units into the skin 3 (three) times daily with meals. 150-180=8 units 200=10- units >200=12-14 units   Yes Historical Provider, MD  insulin detemir (LEVEMIR) 100 UNIT/ML injection Inject 65 Units into the skin at bedtime.    Yes Historical Provider, MD  losartan (COZAAR) 100 MG tablet TAKE 1 TABLET EVERY DAY 02/24/15  Yes Fayrene Helper, MD  pravastatin (PRAVACHOL) 40 MG tablet TAKE 1 TABLET BY MOUTH DAILY 12/05/14  Yes Fayrene Helper, MD  albuterol (PROVENTIL HFA;VENTOLIN HFA) 108 (90 BASE) MCG/ACT inhaler Inhale 2 puffs into the lungs every 6 (six) hours as needed for wheezing or shortness of breath. Patient not taking: Reported on 05/31/2015 12/07/14   Radene Gunning, NP  ONE TOUCH ULTRA TEST  test strip TEST BLOOD SUGAR 2 TIMES DAILY 07/15/14   Fayrene Helper, MD  [facility] DELICA LANCETS 82U MISC TEST BLOOD SUGAR TWO TIMES DAILY    Fayrene Helper, MD  polyethylene glycol-electrolytes (TRILYTE) 420 G solution Take 4,000 mLs by mouth as directed. Patient not taking: Reported on 05/31/2015 02/07/15   Daneil Dolin, MD  tiotropium (SPIRIVA HANDIHALER) 18 MCG inhalation capsule Place 1 capsule (18 mcg total) into inhaler and inhale daily. Patient not taking: Reported on 05/31/2015 12/07/14   Radene Gunning,  NP    Allergies:  Allergies  Allergen Reactions  . Metformin And Related Diarrhea    Cannot tolerate    History   Social History  . Marital Status: Married    Spouse Name: N/A  . Number of Children: N/A  . Years of Education: N/A   Occupational History  . Not on file.   Social History Main Topics  . Smoking status: Former Smoker -- 0.50 packs/day    Start date: 05/04/1964    Quit date: 02/09/2013  . Smokeless tobacco: Never Used  . Alcohol Use: No  . Drug Use: No  . Sexual Activity: Not on file   Other Topics Concern  . Not on file   Social History Narrative     Family History  Problem Relation Age of Onset  . Diabetes Sister     Review of Systems: General: negative for chills, fever, night sweats or weight changes.  ENT: negative for rhinorrhea or epistaxis Cardiovascular: negative for chest pain, edema, orthopnea, palpitations, or paroxysmal nocturnal dyspnea Dermatological: negative for rash Respiratory: negative for cough or wheezing. Positive for shortness of breath GI: negative for nausea, vomiting, diarrhea, bright red blood per rectum, melena, or hematemesis GU: no hematuria, urgency, or frequency Neurologic: negative for visual changes, syncope, headache, or dizziness Heme: no easy bruising or bleeding Endo: negative for excessive thirst, thyroid disorder, or flushing Musculoskeletal: negative for joint pain or swelling, negative for myalgias All other systems reviewed and are otherwise negative except as noted above.  Physical Exam: Blood pressure 173/92, pulse 101, temperature 97.9 F (36.6 C), temperature source Oral, resp. rate 18, height _0  (1.905 m), weight 214 lb (97.07 kg), SpO2 95 %. General: Well developed, well nourished, alert and oriented, in no acute distress. HEENT: Normocephalic, atraumatic, sclera anicteric Neck: Supple. Carotids 2+ without bruits. JVP normal Lungs: Clear bilaterally to auscultation without wheezes, rales, or  rhonchi. Breathing is unlabored. Heart: RRR with normal S1 and S2. No murmurs, rubs, or gallops appreciated. Abdomen: Soft, non-tender, non-distended with normoactive bowel sounds. No hepatomegaly. No rebound/guarding. No obvious abdominal masses. Back: No CVA tenderness Msk:  Strength and tone appear normal for age. Extremities: No clubbing, cyanosis, or edema.  Distal pedal pulses are 2+ and equal bilaterally. Neuro: CNII-XII intact, moves all extremities spontaneously. Psych:  Responds to questions appropriately with a normal affect. Skin: warm and dry without rash   Labs:   Lab Results  Component Value Date   WBC 5.4 05/31/2015   HGB 16.2 05/31/2015   HCT 47.9 05/31/2015   MCV 83.9 05/31/2015   PLT 156 05/31/2015    Recent Labs Lab 05/31/15 0705  NA 137  K 3.8  CL 104  CO2 22  BUN 17  CREATININE 1.40*  CALCIUM 9.3  GLUCOSE 159*   No results for input(s): CKTOTAL, CKMB, TROPONINI in the last 72 hours. Lab Results  Component Value Date   CHOL 133 01/27/2015   HDL 31*  01/27/2015   LDLCALC 65 01/27/2015   TRIG 184* 01/27/2015   No results found for: DDIMER  Radiology/Studies:  No results found.   EKG: Normal sinus rhythm with first-degree AV block, nonspecific ST abnormality  CARDIAC STUDIES: Myoview scan 02/02/2015: IMPRESSION: 1. No reversible ischemia or infarction.  2. Normal left ventricular wall motion.  3. Left ventricular ejection fraction 61%  4. Low-risk stress test findings*.\  2-D echocardiogram 12/06/2014: Study Conclusions  - Left ventricle: The cavity size was normal. Wall thickness was increased increased in a pattern of mild to moderate LVH. Systolic function was normal. The estimated ejection fraction was in the range of 60% to 65%. Wall motion was normal; there were no regional wall motion abnormalities. Doppler parameters are consistent with abnormal left ventricular relaxation (grade 1 diastolic dysfunction). -  Aortic valve: Mildly calcified annulus. Trileaflet; mildly thickened leaflets. - Mitral valve: Mildly calcified annulus. Mildly thickened leaflets . - Left atrium: The atrium was mildly dilated. - Right ventricle: The cavity size was mildly to moderately dilated. The RV shares the apex with the LV. Systolic function was normal. RV TAPSE is 1.7 cm. - Right atrium: The atrium was moderately dilated. - Pulmonary arteries: Systolic pressure was moderately increased. PA peak pressure: 55 mm Hg (S). - Technically adequate study.  ASSESSMENT AND PLAN:  69 year old gentleman with progressive exertional dyspnea out of proportion to the extent of his pulmonary disease. He presents for right heart catheterization for further evaluation. I have reviewed the risks, indications, and alternatives to right heart catheterization with patient and his wife. They understand and agree to proceed.  Deatra James MD 05/31/2015, 8:17 AM

## 2015-05-31 NOTE — Progress Notes (Signed)
Site area: rt ac venous sheath Site Prior to Removal:  Level 0 Pressure Applied For:  10 minutes Manual:   yes Patient Status During Pull:  stable Post Pull Site:  Level  0 Post Pull Instructions Given:  yes Post Pull Pulses Present: yes Dressing Applied:  Small tegaderm Bedrest begins @  0915 Comments: 0

## 2015-05-31 NOTE — Discharge Instructions (Signed)
Wound Site Care Refer to this sheet in the next few weeks. These instructions provide you with information on caring for yourself after your procedure. Your caregiver may also give you more specific instructions. Your treatment has been planned according to current medical practices, but problems sometimes occur. Call your caregiver if you have any problems or questions after your procedure. HOME CARE INSTRUCTIONS  You may shower the day after the procedure.Remove the bandage (dressing) and gently wash the site with plain soap and water.Gently pat the site dry.  Do not apply powder or lotion to the site.  Do not submerge the affected site in water for 3 to 5 days.  Inspect the site at least twice daily. What to expect:  Any bruising will usually fade within 1 to 2 weeks.  Blood that collects in the tissue (hematoma) may be painful to the touch. It should usually decrease in size and tenderness within 1 to 2 weeks. SEEK IMMEDIATE MEDICAL CARE IF:  You have unusual pain at the site.  You have redness, warmth, swelling, or pain at the site.  You have drainage (other than a small amount of blood on the dressing).  You have chills.  You have a fever or persistent symptoms for more than 72 hours.  You have a fever and your symptoms suddenly get worse.  Your arm becomes pale, cool, tingly, or numb.  You have heavy bleeding from the site. Hold pressure on the site. Document Released: 11/30/2010 Document Revised: 01/20/2012 Document Reviewed: 11/30/2010 [facility] Patient Information 2015 [place], [place]. This information is not intended to replace advice given to you by your health care provider. Make sure you discuss any questions you have with your health care provider.

## 2015-06-01 ENCOUNTER — Telehealth: Payer: Self-pay | Admitting: *Deleted

## 2015-06-01 MED FILL — Heparin Sodium (Porcine) 2 Unit/ML in Sodium Chloride 0.9%: INTRAMUSCULAR | Qty: 1000 | Status: AC

## 2015-06-01 MED FILL — Lidocaine HCl Local Preservative Free (PF) Inj 1%: INTRAMUSCULAR | Qty: 30 | Status: AC

## 2015-06-01 NOTE — Telephone Encounter (Signed)
pls send a cancel order and advise oxygen to be prescribed by his pulmonary Doc who will order, Hand written note left with you

## 2015-06-01 NOTE — Telephone Encounter (Signed)
Pt called and states that heneeds Dr. Moshe Cipro to cancel his [facility] oxygen order. He has another agency that comes to his home. The name is  [facility]. Please advise MD.

## 2015-06-02 ENCOUNTER — Ambulatory Visit: Payer: Medicare Other | Admitting: Adult Health

## 2015-06-02 NOTE — Telephone Encounter (Signed)
Note sent to [facility]

## 2015-07-12 ENCOUNTER — Ambulatory Visit: Payer: Medicare Other | Admitting: Family Medicine

## 2015-07-21 ENCOUNTER — Other Ambulatory Visit: Payer: Self-pay | Admitting: Family Medicine

## 2015-07-22 LAB — BASIC METABOLIC PANEL WITH GFR
BUN: 16 mg/dL (ref 7–25)
CHLORIDE: 106 mmol/L (ref 98–110)
CO2: 23 mmol/L (ref 20–31)
CREATININE: 1.2 mg/dL — AB (ref 0.70–1.18)
Calcium: 9 mg/dL (ref 8.6–10.3)
GFR, Est African American: 70 mL/min (ref >=60)
GFR, Est Non African American: 61 mL/min (ref >=60)
Glucose, Bld: 128 mg/dL — ABNORMAL HIGH (ref 65–99)
Potassium: 4.4 mmol/L (ref 3.5–5.3)
Sodium: 140 mmol/L (ref 135–146)

## 2015-07-22 LAB — TSH: TSH: 5.531 u[IU]/mL — ABNORMAL HIGH (ref 0.350–4.500)

## 2015-07-22 LAB — HEMOGLOBIN A1C
Hgb A1c MFr Bld: 8 % — ABNORMAL HIGH (ref <5.7)
Mean Plasma Glucose: 183 mg/dL — ABNORMAL HIGH (ref <117)

## 2015-07-24 LAB — T3, FREE: T3, Free: 3 pg/mL (ref 2.3–4.2)

## 2015-07-24 LAB — T4, FREE: Free T4: 1.04 ng/dL (ref 0.80–1.80)

## 2015-07-26 ENCOUNTER — Encounter: Payer: Self-pay | Admitting: Family Medicine

## 2015-07-26 ENCOUNTER — Ambulatory Visit (INDEPENDENT_AMBULATORY_CARE_PROVIDER_SITE_OTHER): Payer: Medicare Other | Admitting: Family Medicine

## 2015-07-26 VITALS — BP 180/90 | HR 80 | Resp 16 | Ht 75.0 in | Wt 217.1 lb

## 2015-07-26 DIAGNOSIS — R195 Other fecal abnormalities: Secondary | ICD-10-CM

## 2015-07-26 DIAGNOSIS — E1129 Type 2 diabetes mellitus with other diabetic kidney complication: Secondary | ICD-10-CM | POA: Diagnosis not present

## 2015-07-26 DIAGNOSIS — R194 Change in bowel habit: Secondary | ICD-10-CM

## 2015-07-26 DIAGNOSIS — I10 Essential (primary) hypertension: Secondary | ICD-10-CM | POA: Diagnosis not present

## 2015-07-26 DIAGNOSIS — I272 Pulmonary hypertension, unspecified: Secondary | ICD-10-CM

## 2015-07-26 DIAGNOSIS — I270 Primary pulmonary hypertension: Secondary | ICD-10-CM

## 2015-07-26 DIAGNOSIS — E785 Hyperlipidemia, unspecified: Secondary | ICD-10-CM

## 2015-07-26 DIAGNOSIS — IMO0002 Reserved for concepts with insufficient information to code with codable children: Secondary | ICD-10-CM

## 2015-07-26 DIAGNOSIS — E1165 Type 2 diabetes mellitus with hyperglycemia: Secondary | ICD-10-CM

## 2015-07-26 DIAGNOSIS — R0902 Hypoxemia: Secondary | ICD-10-CM

## 2015-07-26 NOTE — Patient Instructions (Addendum)
Nurse BP check 9/26, and flu vaccine  mD  F/u end Nov  BP is high, take amlodipine 2.5 mg TWO tablets every evening at the same time, continue cozaar 127m One every day at the same time, will need new scriopt for higher dose when you come back if controlled  Increase levimir to 55 units daily   Foot exam and microalb today

## 2015-07-26 NOTE — Assessment & Plan Note (Addendum)
1 year h/o stringy stool followed every approx 9 days by watery brown stool , Dr Gala Romney to re eval, had recent colonoscopy, but has concerns about stool, states it is "just not right" and that he needs to see GI Doc

## 2015-07-26 NOTE — Progress Notes (Signed)
Subjective:    Patient ID: [name], male    DOB: May 19, 1946, 69 y.o.   MRN: [id number]  HPI   [name]     MRN: [id number]      DOB: 1946/07/24   HPI [name] is here for follow up and re-evaluation of chronic medical conditions, medication management and review of any available recent lab and radiology data.  Preventive health is updated, specifically  Cancer screening and Immunization. Wants to wait on f;lu vaccine  Questions or concerns regarding consultations or procedures which the PT has had in the interim are  Addressed.startred med for pulmonary hypertension, tolerating well The PT denies any adverse reactions to current medications since the last visit.  Denies polyuria, polydipsia, blurred vision , or hypoglycemic episodes. States not wanting to see endo at thsi time as he "understands what he needs to do and that visits are less than 5 mins" Had another near syncopal episode after getting off flight  ROS Denies recent fever or chills. Denies sinus pressure, nasal congestion, ear pain or sore throat. Denies chest congestion, productive cough or wheezing. Denies chest pains, palpitations and leg swelling Denies abdominal pain, nausea, vomiting,diarrhea Concerned about stool pattern in past approx 9 months requests GI re evaluation.   Denies dysuria, frequency, hesitancy or incontinence. Denies uncontrolled  joint pain, swelling and limitation in mobility. Denies headaches, seizures, numbness, or tingling. Denies depression, anxiety or insomnia. Denies skin break down or rash.   PE  BP 180/90 mmHg  Pulse 80  Resp 16  Ht _0  (1.905 m)  Wt 217 lb 1.9 oz (98.485 kg)  BMI 27.14 kg/m2  SpO2 93%  Patient alert and oriented and in no cardiopulmonary distress. Requires supplemental oxygen while ambulating HEENT: No facial asymmetry, EOMI,   oropharynx pink and moist.  Neck supple no JVD, no mass.  Chest: Clear to auscultation bilaterally.Markedly  reduced air entry bilaterally  CVS: S1, S2 no murmurs, no S3.Regular rate.  ABD: Soft non tender.   Ext: No edema  MS: Adequate though reduced  ROM spine, shoulders, hips and knees.  Skin: Intact, no ulcerations noted.  Psych: Good eye contact, normal affect. Memory intact not anxious or depressed appearing.  CNS: CN 2-12 intact, power,  normal throughout.no focal deficits noted.   Assessment & Plan   Change in stool caliber 1 year h/o stringy stool followed every approx 9 days by watery brown stool , Dr Gala Romney to re eval, had recent colonoscopy, but has concerns about stool, states it is "just not right" and that he needs to see GI Doc  Essential hypertension Uncontrolled , was taking only one of the 2 BP meds prescribed, re educated, also dose of medication increased F/u in 12 days DASH diet and commitment to daily physical activity for a minimum of 30 minutes discussed and encouraged, as a part of hypertension management. The importance of attaining a healthy weight is also discussed.  BP/Weight 07/26/2015 05/31/2015 05/11/2015 04/26/2015 04/05/2015 03/06/2015 4/74/2595  Systolic BP 638 756 433 295 188 416 606  Diastolic BP 90 301 84 92 92 64 96  Wt. (Lbs) 217.12 214 - - 224 - 225.6  BMI 27.14 26.75 - - 28 - 28.2        Pulmonary hypertension Currently being treated and hopeful for improved outcome, has had one additional near syncopal episode since last visit, again related to travel  Hypoxia xygen dependent and uses this while ambulating   DM (diabetes mellitus), type  2, uncontrolled, with renal complications [name] is reminded of the importance of commitment to daily physical activity for 30 minutes or more, as able and the need to limit carbohydrate intake to 30 to 60 grams per meal to help with blood sugar control.   The need to take medication as prescribed, test blood sugar as directed, and to call between visits if there is a concern that blood sugar is  uncontrolled is also discussed.   [name] is reminded of the importance of daily foot exam, annual eye examination, and good blood sugar, blood pressure and cholesterol control. Improving , dose of levimir increased to 55 units, pot has decided does not want to see endo at this time  Diabetic Labs Latest Ref Rng 07/26/2015 07/21/2015 05/31/2015 04/25/2015 01/27/2015  HbA1c <5.7 % - 8.0(H) - - -  Microalbumin <2.0 mg/dL 16.4(H) - - - -  Micro/Creat Ratio 0.0 - 30.0 mg/g 167.7(H) - - - -  Chol 0 - 200 mg/dL - - - - 133  HDL >=40 mg/dL - - - - 31(L)  Calc LDL 0 - 99 mg/dL - - - - 65  Triglycerides <150 mg/dL - - - - 184(H)  Creatinine 0.70 - 1.18 mg/dL - 1.20(H) 1.40(H) 1.40(H) -   BP/Weight 07/26/2015 05/31/2015 05/11/2015 04/26/2015 04/05/2015 03/06/2015 6/46/8032  Systolic BP 122 482 500 370 488 891 694  Diastolic BP 90 503 84 92 92 64 96  Wt. (Lbs) 217.12 214 - - 224 - 225.6  BMI 27.14 26.75 - - 28 - 28.2   Foot/eye exam completion dates Latest Ref Rng 07/26/2015 07/12/2014  Eye Exam No Retinopathy - No Retinopathy  Foot Form Completion - Done -         Hyperlipidemia with target LDL less than 100 Hyperlipidemia:Low fat diet discussed and encouraged.   Lipid Panel  Lab Results  Component Value Date   CHOL 133 01/27/2015   HDL 31* 01/27/2015   LDLCALC 65 01/27/2015   TRIG 184* 01/27/2015   CHOLHDL 4.3 01/27/2015   Reduced fatty food encouraged         Review of Systems     Objective:   Physical Exam        Assessment & Plan:

## 2015-07-27 LAB — MICROALBUMIN / CREATININE URINE RATIO
CREATININE, URINE: 97.8 mg/dL
Microalb Creat Ratio: 167.7 mg/g — ABNORMAL HIGH (ref 0.0–30.0)
Microalb, Ur: 16.4 mg/dL — ABNORMAL HIGH (ref <2.0)

## 2015-07-27 LAB — HEPATITIS C ANTIBODY: HCV AB: NEGATIVE

## 2015-07-31 ENCOUNTER — Encounter: Payer: Self-pay | Admitting: Internal Medicine

## 2015-07-31 NOTE — Assessment & Plan Note (Signed)
[name] is reminded of the importance of commitment to daily physical activity for 30 minutes or more, as able and the need to limit carbohydrate intake to 30 to 60 grams per meal to help with blood sugar control.   The need to take medication as prescribed, test blood sugar as directed, and to call between visits if there is a concern that blood sugar is uncontrolled is also discussed.   [name] is reminded of the importance of daily foot exam, annual eye examination, and good blood sugar, blood pressure and cholesterol control. Improving , dose of levimir increased to 55 units, pot has decided does not want to see endo at this time  Diabetic Labs Latest Ref Rng 07/26/2015 07/21/2015 05/31/2015 04/25/2015 01/27/2015  HbA1c <5.7 % - 8.0(H) - - -  Microalbumin <2.0 mg/dL 16.4(H) - - - -  Micro/Creat Ratio 0.0 - 30.0 mg/g 167.7(H) - - - -  Chol 0 - 200 mg/dL - - - - 133  HDL >=40 mg/dL - - - - 31(L)  Calc LDL 0 - 99 mg/dL - - - - 65  Triglycerides <150 mg/dL - - - - 184(H)  Creatinine 0.70 - 1.18 mg/dL - 1.20(H) 1.40(H) 1.40(H) -   BP/Weight 07/26/2015 05/31/2015 05/11/2015 04/26/2015 04/05/2015 03/06/2015 06/05/2034  Systolic BP 597 416 384 536 468 032 122  Diastolic BP 90 482 84 92 92 64 96  Wt. (Lbs) 217.12 214 - - 224 - 225.6  BMI 27.14 26.75 - - 28 - 28.2   Foot/eye exam completion dates Latest Ref Rng 07/26/2015 07/12/2014  Eye Exam No Retinopathy - No Retinopathy  Foot Form Completion - Done -

## 2015-07-31 NOTE — Assessment & Plan Note (Addendum)
Currently being treated and hopeful for improved outcome, has had one additional near syncopal episode since last visit, again related to travel

## 2015-07-31 NOTE — Assessment & Plan Note (Signed)
Uncontrolled , was taking only one of the 2 BP meds prescribed, re educated, also dose of medication increased F/u in 12 days DASH diet and commitment to daily physical activity for a minimum of 30 minutes discussed and encouraged, as a part of hypertension management. The importance of attaining a healthy weight is also discussed.  BP/Weight 07/26/2015 05/31/2015 05/11/2015 04/26/2015 04/05/2015 03/06/2015 7/41/2878  Systolic BP 676 720 947 096 283 662 947  Diastolic BP 90 654 84 92 92 64 96  Wt. (Lbs) 217.12 214 - - 224 - 225.6  BMI 27.14 26.75 - - 28 - 28.2

## 2015-07-31 NOTE — Assessment & Plan Note (Signed)
Hyperlipidemia:Low fat diet discussed and encouraged.   Lipid Panel  Lab Results  Component Value Date   CHOL 133 01/27/2015   HDL 31* 01/27/2015   LDLCALC 65 01/27/2015   TRIG 184* 01/27/2015   CHOLHDL 4.3 01/27/2015   Reduced fatty food encouraged

## 2015-07-31 NOTE — Assessment & Plan Note (Signed)
xygen dependent and uses this while ambulating

## 2015-08-07 ENCOUNTER — Ambulatory Visit: Payer: Medicare Other

## 2015-08-07 VITALS — BP 142/84

## 2015-08-07 DIAGNOSIS — I272 Pulmonary hypertension, unspecified: Secondary | ICD-10-CM

## 2015-08-07 MED ORDER — AMLODIPINE BESYLATE 5 MG PO TABS: 5.0000 mg | ORAL_TABLET | Freq: Every day | ORAL | Status: DC

## 2015-08-07 NOTE — Progress Notes (Signed)
Patient in for nurse bp check.  Blood pressure checked and patient will continue meds as ordered.  New script for Amlodipine 3m sent to pharmacy.  Will keep next scheduled office visit and call with any problems.

## 2015-08-15 LAB — HM DIABETES EYE EXAM

## 2015-08-16 ENCOUNTER — Encounter: Payer: Self-pay | Admitting: Gastroenterology

## 2015-08-16 ENCOUNTER — Ambulatory Visit (INDEPENDENT_AMBULATORY_CARE_PROVIDER_SITE_OTHER): Payer: Medicare Other | Admitting: Gastroenterology

## 2015-08-16 VITALS — BP 121/76 | HR 103 | Temp 97.8°F | Ht 75.0 in | Wt 215.0 lb

## 2015-08-16 DIAGNOSIS — R197 Diarrhea, unspecified: Secondary | ICD-10-CM | POA: Insufficient documentation

## 2015-08-16 NOTE — Progress Notes (Signed)
Primary Care Physician: Tula Nakayama, MD  Primary Gastroenterologist:  Garfield Cornea, MD   Chief Complaint  Patient presents with  . Follow-up  . Diarrhea    HPI: [name] is a 69 y.o. male here for change in bowel habits. Last seen in April 2016 for surveillance colonoscopy given history adenomatous colon polyps. 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 Multiple rectal and colonic polyps (2), tubular adenomas. Given size of polyps, next surveillance colonoscopy planned for 3 years.   Just prior to last colonoscopy he reports that he started having some diarrhea. Started two days before his TCS. He thought it would go away but notes persistent over the past six months. Every day he has at least 2-3 loose stools. But about every 8-9 days, the has 5-6 "muddy" watery stools. Prior to this, he had 1-2 formed stools daily. No abdominal pain. No rectal bleeding. Appetite is good. No recent antibiotics or ill contacts. No longer having formed BMs.   No medication changes prior to these symptoms but since then he has added Norvasc and Tadalafil (but already having diarrhea). Notes he had to get off metformin years ago due to bowel issues.   Denies upper GI complaints.    Current Outpatient Prescriptions  Medication Sig Dispense Refill  . amLODipine (NORVASC) 5 MG tablet Take 1 tablet (5 mg total) by mouth daily. 30 tablet 2  . aspirin EC 81 MG tablet Take 81 mg by mouth daily.    . fluticasone (FLONASE) 50 MCG/ACT nasal spray Place 2 sprays into both nostrils daily. 16 g 6  . insulin aspart (NOVOLOG) 100 UNIT/ML injection Inject 8-14 Units into the skin 3 (three) times daily with meals. 150-180=8 units 200=10- units >200=12-14 units    . insulin detemir (LEVEMIR) 100 UNIT/ML injection Inject 65 Units into the skin at bedtime.     [name] losartan (COZAAR) 100 MG tablet TAKE 1 TABLET EVERY DAY 90 tablet 1  . ONE TOUCH ULTRA TEST test strip TEST BLOOD SUGAR 2 TIMES DAILY 100 each 3  .  ONETOUCH DELICA LANCETS 21H MISC TEST BLOOD SUGAR TWO TIMES DAILY 100 each 0  . pravastatin (PRAVACHOL) 40 MG tablet TAKE 1 TABLET BY MOUTH DAILY 90 tablet 1  . Tadalafil, PAH, (ADCIRCA) 20 MG TABS Take 2 tablets by mouth daily.    . ergocalciferol (VITAMIN D2) 50000 UNITS capsule Take 1 capsule (50,000 Units total) by mouth once a week. One capsule once weekly 12 capsule 3   No current facility-administered medications for this visit.    Allergies as of 08/16/2015 - Review Complete 08/16/2015  Allergen Reaction Noted  . Metformin and related Diarrhea 01/17/2014   Past Surgical History  Procedure Laterality Date  . Left wrist    . Rotator cuff repair  1995    right   . Lithotripsy  2010  . Colonoscopy   11/25/2007    YQM:VHQION rectum and splenic flexure and sigmoid polyps/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 tubular adenomas  . Colonoscopy N/A 03/06/2015    GEX:BMWUXLKGM coli/multiple rectal and colon polyps, tubular adenomas. Next colonoscopy April 2019.  . Cardiac catheterization N/A 05/31/2015    Procedure: Right Heart Cath;  Surgeon: Sherren Mocha, MD;  Location: [facility];  Service: Cardiovascular;  Laterality: N/A;   Past Medical History  Diagnosis Date  . Hyperlipidemia   . Hypertension   . ED (erectile dysfunction)   . PVD (peripheral vascular disease) ([place])   . Carotid bruit   .  History of renal calculi   . Diabetes mellitus without complication ([place])   . DOE (dyspnea on exertion)     2016  . Hypoxia     2016  . COPD (chronic obstructive pulmonary disease) ([place])   . Asthma     ROS:  General: Negative for anorexia, weight loss, fever, chills, fatigue, weakness. ENT: Negative for hoarseness, difficulty swallowing , nasal congestion. CV: Negative for chest pain, angina, palpitations, dyspnea on exertion, peripheral edema.  Respiratory: Negative for dyspnea at rest, dyspnea on exertion, cough, sputum, wheezing.  GI: See  history of present illness. GU:  Negative for dysuria, hematuria, urinary incontinence, urinary frequency, nocturnal urination.  Endo: Negative for unusual weight change.    Physical Examination:   BP 121/76 mmHg  Pulse 103  Temp(Src) 97.8 F (36.6 C) (Oral)  Ht _0  (1.905 m)  Wt 215 lb (97.523 kg)  BMI 26.87 kg/m2  General: Well-nourished, well-developed in no acute distress.  Eyes: No icterus. Mouth: Oropharyngeal mucosa moist and pink , no lesions erythema or exudate. Lungs: Clear to auscultation bilaterally.  Heart: Regular rate and rhythm, no murmurs rubs or gallops.  Abdomen: Bowel sounds are normal, nontender, nondistended, no hepatosplenomegaly or masses, no abdominal bruits or hernia , no rebound or guarding.   Extremities: No lower extremity edema. No clubbing or deformities. Neuro: Alert and oriented x 4   Skin: Warm and dry, no jaundice.   Psych: Alert and cooperative, normal mood and affect.  Labs:  Lab Results  Component Value Date   TSH 5.531* 07/21/2015   Lab Results  Component Value Date   HGBA1C 8.0* 07/21/2015   Lab Results  Component Value Date   CREATININE 1.20* 07/21/2015   BUN 16 07/21/2015   NA 140 07/21/2015   K 4.4 07/21/2015   CL 106 07/21/2015   CO2 23 07/21/2015   Lab Results  Component Value Date   ALT 16 01/27/2015   AST 19 01/27/2015   ALKPHOS 52 01/27/2015   BILITOT 0.5 01/27/2015   Lab Results  Component Value Date   WBC 5.4 05/31/2015   HGB 16.2 05/31/2015   HCT 47.9 05/31/2015   MCV 83.9 05/31/2015   PLT 156 05/31/2015    Imaging Studies: No results found.

## 2015-08-16 NOTE — Progress Notes (Signed)
cc'ed to pcp °

## 2015-08-16 NOTE — Patient Instructions (Signed)
1. Please collect stools and return to lab. We will call you with results and further recommendations.

## 2015-08-16 NOTE — Assessment & Plan Note (Signed)
Several month h/o change in stools from normal formed BM daily to several loose stools daily and some days increased number of watery stools. Colonoscopy up to date. Patient denies any relationship with recent medication changes. Need to rule out infectious etiology first. Recent labs by PCP reviewed. Further recommendations to follow.

## 2015-08-17 ENCOUNTER — Other Ambulatory Visit: Payer: Self-pay | Admitting: Gastroenterology

## 2015-08-18 LAB — GIARDIA/CRYPTOSPORIDIUM (EIA)
Cryptosporidium Screen (EIA): NEGATIVE
Giardia Screen (EIA): NEGATIVE

## 2015-08-18 LAB — CLOSTRIDIUM DIFFICILE BY PCR: Toxigenic C. Difficile by PCR: NOT DETECTED

## 2015-08-21 LAB — STOOL CULTURE

## 2015-08-22 NOTE — Progress Notes (Signed)
Quick Note:  Stool studies negative.  Recommend probiotic for four weeks ([facility] colon [facility]) or yogurt with live cultures twice daily for four weeks.  Add fiber supplement daily, such as benefiber or fiberchoice.  If ongoing bowel issues, he should call and let us know. ______

## 2015-09-20 ENCOUNTER — Other Ambulatory Visit: Payer: Self-pay | Admitting: Family Medicine

## 2015-09-24 ENCOUNTER — Other Ambulatory Visit: Payer: Self-pay | Admitting: Family Medicine

## 2015-10-10 ENCOUNTER — Encounter: Payer: Self-pay | Admitting: Family Medicine

## 2015-10-10 ENCOUNTER — Ambulatory Visit (INDEPENDENT_AMBULATORY_CARE_PROVIDER_SITE_OTHER): Payer: Medicare Other | Admitting: Family Medicine

## 2015-10-10 VITALS — BP 138/80 | HR 103 | Resp 16 | Ht 75.0 in | Wt 224.0 lb

## 2015-10-10 DIAGNOSIS — E1165 Type 2 diabetes mellitus with hyperglycemia: Secondary | ICD-10-CM

## 2015-10-10 DIAGNOSIS — IMO0002 Reserved for concepts with insufficient information to code with codable children: Secondary | ICD-10-CM

## 2015-10-10 DIAGNOSIS — I10 Essential (primary) hypertension: Secondary | ICD-10-CM | POA: Diagnosis not present

## 2015-10-10 DIAGNOSIS — E559 Vitamin D deficiency, unspecified: Secondary | ICD-10-CM

## 2015-10-10 DIAGNOSIS — E1129 Type 2 diabetes mellitus with other diabetic kidney complication: Secondary | ICD-10-CM

## 2015-10-10 DIAGNOSIS — R600 Localized edema: Secondary | ICD-10-CM

## 2015-10-10 DIAGNOSIS — I272 Other secondary pulmonary hypertension: Secondary | ICD-10-CM

## 2015-10-10 DIAGNOSIS — E785 Hyperlipidemia, unspecified: Secondary | ICD-10-CM | POA: Diagnosis not present

## 2015-10-10 LAB — HEMOGLOBIN A1C
HEMOGLOBIN A1C: 8.1 % — AB (ref <5.7)
MEAN PLASMA GLUCOSE: 186 mg/dL — AB (ref <117)

## 2015-10-10 LAB — LIPID PANEL
Cholesterol: 108 mg/dL — ABNORMAL LOW (ref 125–200)
HDL: 32 mg/dL — ABNORMAL LOW (ref >=40)
LDL CALC: 69 mg/dL (ref <130)
TRIGLYCERIDES: 35 mg/dL (ref <150)
Total CHOL/HDL Ratio: 3.4 Ratio (ref <=5.0)
VLDL: 7 mg/dL (ref <30)

## 2015-10-10 LAB — COMPLETE METABOLIC PANEL WITH GFR
ALK PHOS: 61 U/L (ref 40–115)
ALT: 13 U/L (ref 9–46)
AST: 20 U/L (ref 10–35)
Albumin: 3.7 g/dL (ref 3.6–5.1)
BILIRUBIN TOTAL: 1 mg/dL (ref 0.2–1.2)
BUN: 19 mg/dL (ref 7–25)
CO2: 21 mmol/L (ref 20–31)
Calcium: 8.8 mg/dL (ref 8.6–10.3)
Chloride: 108 mmol/L (ref 98–110)
Creat: 1.25 mg/dL — ABNORMAL HIGH (ref 0.70–1.18)
GFR, EST AFRICAN AMERICAN: 67 mL/min (ref >=60)
GFR, EST NON AFRICAN AMERICAN: 58 mL/min — AB (ref >=60)
Glucose, Bld: 150 mg/dL — ABNORMAL HIGH (ref 65–99)
Potassium: 4.2 mmol/L (ref 3.5–5.3)
Sodium: 139 mmol/L (ref 135–146)
Total Protein: 6.7 g/dL (ref 6.1–8.1)

## 2015-10-10 MED ORDER — FLUTICASONE PROPIONATE 50 MCG/ACT NA SUSP: 2.0000 | Freq: Every day | NASAL | Status: DC

## 2015-10-10 MED ORDER — FUROSEMIDE 20 MG PO TABS: 20.0000 mg | ORAL_TABLET | Freq: Every day | ORAL | Status: DC

## 2015-10-10 MED ORDER — POTASSIUM CHLORIDE CRYS ER 20 MEQ PO TBCR: 20.0000 meq | EXTENDED_RELEASE_TABLET | Freq: Every day | ORAL | Status: DC

## 2015-10-10 NOTE — Patient Instructions (Signed)
F/u in 3 months, call if you need me sooner  New medication for swelling is lasix and potassium  New for allergies is daily Flonase  Will review more prior to cardiology eval and get back in touch  Labs today  Thanks for choosing [facility], we consider it a privilege to serve you.   All the best for 2017!

## 2015-10-10 NOTE — Progress Notes (Signed)
Subjective:    Patient ID: [name], male    DOB: [birth year], 69 y.o.   MRN: [id number]  HPI Pt initially scheduled for complete exam, however, had colonoscopy a few months back so visit modified.Also he is followed by urology as far as his prostate is concerned C/o increased and uncontrolled allergy symptoms, clear runny nose , watery eyes and excessive sneezing in past several weeks. No fever or cough or sputum C/o increased leg swelling with addition of new medication to treat his lung disease. States he may be headed to [facility] in the near future for further consultation , as he has not responded favorably to treatment so far. Had right heart cath performed which is normal Denies polyuria, polydipsia, blurred vision , or hypoglycemic episodes. Blood sugar averages about 140 fasting    Review of Systems See HPI Denies recent fever or chills.  Denies chest congestion, productive cough or wheezing.c/o increasingly poor exercise tolerance Denies chest pains, palpitations, PND or orhtopnea.c/o bilateral  leg swelling Denies abdominal pain, nausea, vomiting,diarrhea or constipation.   Denies dysuria, frequency, hesitancy or incontinence. Denies joint pain, swelling  Denies headaches, seizures, numbness, or tingling. Denies depression, anxiety or insomnia. Denies skin break down or rash.        Objective:   Physical Exam  BP 138/80 mmHg  Pulse 103  Resp 16  Ht _0  (1.905 m)  Wt 224 lb (101.606 kg)  BMI 28.00 kg/m2  SpO2 85% Patient alert and oriented and in no cardiopulmonary distress at rest on supplemental oxygen, however, he has marked limitation in physical activity  HEENT: No facial asymmetry, EOMI,   oropharynx pink and moist.  Neck supple no JVD, no mass.  Chest: bilateral crackles, no wheezes, decreased air entry throughout  CVS: S1, S2 no murmurs, no S3.Regular rate.  ABD: Soft non tender.   Ext: bilateral 2 plus pitting edema  MS: Adequate ROM  spine, shoulders, hips and knees.  Skin: Intact, facial flushing noted  Psych: Good eye contact, normal affect. Memory intact not anxious or depressed appearing.  CNS: CN 2-12 intact, power,  normal throughout.no focal deficits noted.       Assessment & Plan:  Essential hypertension Controlled, no change in medication DASH diet and commitment to daily physical activity for a minimum of 30 minutes discussed and encouraged, as a part of hypertension management. The importance of attaining a healthy weight is also discussed.  BP/Weight 10/10/2015 08/16/2015 08/07/2015 07/26/2015 05/31/2015 05/11/2015 0/34/9179  Systolic BP 150 569 794 801 655 374 827  Diastolic BP 80 76 84 90 078 84 92  Wt. (Lbs) 224 215 - 217.12 214 - -  BMI 28 26.87 - 27.14 26.75 - -        Pulmonary hypertension Reports worsening of symptoms with more limitation in activity. Followed by pulmonary, and may soon be evaluated at [facility], lack of response to tadalafil, 2nd drug recently added. Pt notes swelling started after this, will add diuretic and send a message to pulmonologist, recent right heart cath is normal  Hyperlipidemia with target LDL less than 100 Hyperlipidemia:Low fat diet discussed and encouraged.   Lipid Panel  Lab Results  Component Value Date   CHOL 108* 10/10/2015   HDL 32* 10/10/2015   LDLCALC 69 10/10/2015   TRIG 35 10/10/2015   CHOLHDL 3.4 10/10/2015   Will reduce dose of medication   DM (diabetes mellitus), type 2, uncontrolled, with renal complications [name] is reminded of the importance of  commitment to daily physical activity for 30 minutes or more, as able and the need to limit carbohydrate intake to 30 to 60 grams per meal to help with blood sugar control.   The need to take medication as prescribed, test blood sugar as directed, and to call between visits if there is a concern that blood sugar is uncontrolled is also discussed.   [name] is reminded of the  importance of daily foot exam, annual eye examination, and good blood sugar, blood pressure and cholesterol control. No change in management  Diabetic Labs Latest Ref Rng 10/10/2015 07/26/2015 07/21/2015 05/31/2015 04/25/2015  HbA1c <5.7 % 8.1(H) - 8.0(H) - -  Microalbumin <2.0 mg/dL - 16.4(H) - - -  Micro/Creat Ratio 0.0 - 30.0 mg/g - 167.7(H) - - -  Chol 125 - 200 mg/dL 108(L) - - - -  HDL >=40 mg/dL 32(L) - - - -  Calc LDL <130 mg/dL 69 - - - -  Triglycerides <150 mg/dL 35 - - - -  Creatinine 0.70 - 1.18 mg/dL 1.25(H) - 1.20(H) 1.40(H) 1.40(H)   BP/Weight 10/10/2015 08/16/2015 08/07/2015 07/26/2015 05/31/2015 05/11/2015 2/35/3614  Systolic BP 431 540 086 761 950 932 671  Diastolic BP 80 76 84 90 245 84 92  Wt. (Lbs) 224 215 - 217.12 214 - -  BMI 28 26.87 - 27.14 26.75 - -   Foot/eye exam completion dates Latest Ref Rng 08/15/2015 07/26/2015  Eye Exam No Retinopathy No Retinopathy -  Foot Form Completion - - Done         Bilateral leg edema Reportedly startd with new medication for lungs, and this is established s/e.  He also has flushing of the face. Start low dose lasix and potassium and re evaluate efficiency, will send message to pulmonary to f/u on this, no need for cardiology re eval at this time

## 2015-10-11 LAB — VITAMIN D 25 HYDROXY (VIT D DEFICIENCY, FRACTURES): VIT D 25 HYDROXY: 22 ng/mL — AB (ref 30–100)

## 2015-10-15 DIAGNOSIS — R600 Localized edema: Secondary | ICD-10-CM | POA: Insufficient documentation

## 2015-10-15 NOTE — Assessment & Plan Note (Addendum)
Reportedly startd with new medication for lungs, and this is established s/e.  He also has flushing of the face. Start low dose lasix and potassium and re evaluate efficiency, will send message to pulmonary to f/u on this, no need for cardiology re eval at this time

## 2015-10-15 NOTE — Assessment & Plan Note (Signed)
[name] is reminded of the importance of commitment to daily physical activity for 30 minutes or more, as able and the need to limit carbohydrate intake to 30 to 60 grams per meal to help with blood sugar control.   The need to take medication as prescribed, test blood sugar as directed, and to call between visits if there is a concern that blood sugar is uncontrolled is also discussed.   [name] is reminded of the importance of daily foot exam, annual eye examination, and good blood sugar, blood pressure and cholesterol control. No change in management  Diabetic Labs Latest Ref Rng 10/10/2015 07/26/2015 07/21/2015 05/31/2015 04/25/2015  HbA1c <5.7 % 8.1(H) - 8.0(H) - -  Microalbumin <2.0 mg/dL - 16.4(H) - - -  Micro/Creat Ratio 0.0 - 30.0 mg/g - 167.7(H) - - -  Chol 125 - 200 mg/dL 108(L) - - - -  HDL >=40 mg/dL 32(L) - - - -  Calc LDL <130 mg/dL 69 - - - -  Triglycerides <150 mg/dL 35 - - - -  Creatinine 0.70 - 1.18 mg/dL 1.25(H) - 1.20(H) 1.40(H) 1.40(H)   BP/Weight 10/10/2015 08/16/2015 08/07/2015 07/26/2015 05/31/2015 05/11/2015 01/24/1760  Systolic BP 607 371 062 694 854 627 035  Diastolic BP 80 76 84 90 009 84 92  Wt. (Lbs) 224 215 - 217.12 214 - -  BMI 28 26.87 - 27.14 26.75 - -   Foot/eye exam completion dates Latest Ref Rng 08/15/2015 07/26/2015  Eye Exam No Retinopathy No Retinopathy -  Foot Form Completion - - Done

## 2015-10-15 NOTE — Assessment & Plan Note (Signed)
Controlled, no change in medication DASH diet and commitment to daily physical activity for a minimum of 30 minutes discussed and encouraged, as a part of hypertension management. The importance of attaining a healthy weight is also discussed.  BP/Weight 10/10/2015 08/16/2015 08/07/2015 07/26/2015 05/31/2015 05/11/2015 05/05/6388  Systolic BP 373 428 768 115 726 203 559  Diastolic BP 80 76 84 90 741 84 92  Wt. (Lbs) 224 215 - 217.12 214 - -  BMI 28 26.87 - 27.14 26.75 - -

## 2015-10-15 NOTE — Assessment & Plan Note (Signed)
Hyperlipidemia:Low fat diet discussed and encouraged.   Lipid Panel  Lab Results  Component Value Date   CHOL 108* 10/10/2015   HDL 32* 10/10/2015   LDLCALC 69 10/10/2015   TRIG 35 10/10/2015   CHOLHDL 3.4 10/10/2015   Will reduce dose of medication

## 2015-10-15 NOTE — Assessment & Plan Note (Signed)
Reports worsening of symptoms with more limitation in activity. Followed by pulmonary, and may soon be evaluated at [facility], lack of response to tadalafil, 2nd drug recently added. Pt notes swelling started after this, will add diuretic and send a message to pulmonologist, recent right heart cath is normal

## 2015-10-16 ENCOUNTER — Other Ambulatory Visit: Payer: Self-pay

## 2015-10-16 MED ORDER — PRAVASTATIN SODIUM 20 MG PO TABS: 20.0000 mg | ORAL_TABLET | Freq: Every day | ORAL | Status: DC

## 2015-11-23 ENCOUNTER — Other Ambulatory Visit: Payer: Self-pay | Admitting: Specialist

## 2015-11-23 DIAGNOSIS — R590 Localized enlarged lymph nodes: Secondary | ICD-10-CM

## 2015-11-23 DIAGNOSIS — R0609 Other forms of dyspnea: Secondary | ICD-10-CM

## 2015-12-04 ENCOUNTER — Ambulatory Visit: Payer: Medicare Other

## 2015-12-21 ENCOUNTER — Other Ambulatory Visit: Payer: Self-pay | Admitting: Family Medicine

## 2016-01-17 ENCOUNTER — Encounter: Payer: Self-pay | Admitting: Family Medicine

## 2016-01-17 ENCOUNTER — Ambulatory Visit (INDEPENDENT_AMBULATORY_CARE_PROVIDER_SITE_OTHER): Payer: Medicare Other | Admitting: Family Medicine

## 2016-01-17 VITALS — BP 160/90 | HR 83 | Resp 16 | Ht 75.0 in | Wt 220.0 lb

## 2016-01-17 DIAGNOSIS — I10 Essential (primary) hypertension: Secondary | ICD-10-CM | POA: Diagnosis not present

## 2016-01-17 DIAGNOSIS — E559 Vitamin D deficiency, unspecified: Secondary | ICD-10-CM | POA: Diagnosis not present

## 2016-01-17 DIAGNOSIS — E1129 Type 2 diabetes mellitus with other diabetic kidney complication: Secondary | ICD-10-CM | POA: Diagnosis not present

## 2016-01-17 DIAGNOSIS — E785 Hyperlipidemia, unspecified: Secondary | ICD-10-CM

## 2016-01-17 DIAGNOSIS — IMO0002 Reserved for concepts with insufficient information to code with codable children: Secondary | ICD-10-CM

## 2016-01-17 DIAGNOSIS — I272 Other secondary pulmonary hypertension: Secondary | ICD-10-CM

## 2016-01-17 DIAGNOSIS — E1165 Type 2 diabetes mellitus with hyperglycemia: Secondary | ICD-10-CM

## 2016-01-17 LAB — COMPLETE METABOLIC PANEL WITH GFR
ALT: 10 U/L (ref 9–46)
AST: 18 U/L (ref 10–35)
Albumin: 4.1 g/dL (ref 3.6–5.1)
Alkaline Phosphatase: 56 U/L (ref 40–115)
BILIRUBIN TOTAL: 0.8 mg/dL (ref 0.2–1.2)
BUN: 18 mg/dL (ref 7–25)
CHLORIDE: 104 mmol/L (ref 98–110)
CO2: 24 mmol/L (ref 20–31)
CREATININE: 1.36 mg/dL — AB (ref 0.70–1.18)
Calcium: 9.3 mg/dL (ref 8.6–10.3)
GFR, Est African American: 61 mL/min (ref >=60)
GFR, Est Non African American: 52 mL/min — ABNORMAL LOW (ref >=60)
GLUCOSE: 119 mg/dL — AB (ref 65–99)
Potassium: 4.3 mmol/L (ref 3.5–5.3)
Sodium: 138 mmol/L (ref 135–146)
TOTAL PROTEIN: 7.2 g/dL (ref 6.1–8.1)

## 2016-01-17 LAB — TSH: TSH: 3.33 m[IU]/L (ref 0.40–4.50)

## 2016-01-17 LAB — LIPID PANEL
Cholesterol: 128 mg/dL (ref 125–200)
HDL: 30 mg/dL — ABNORMAL LOW (ref >=40)
LDL CALC: 89 mg/dL (ref <130)
Total CHOL/HDL Ratio: 4.3 Ratio (ref <=5.0)
Triglycerides: 43 mg/dL (ref <150)
VLDL: 9 mg/dL (ref <30)

## 2016-01-17 MED ORDER — FLUTICASONE PROPIONATE 50 MCG/ACT NA SUSP: 2.0000 | Freq: Every day | NASAL | Status: DC

## 2016-01-17 NOTE — Progress Notes (Signed)
Subjective:    Patient ID: [name], male    DOB: [birth year], 70 y.o.   MRN: [id number]  HPI   [name]     MRN: [id number]      DOB: October 05, 1946   HPI [name] is here for follow up and re-evaluation of chronic medical conditions, medication management and review of any available recent lab and radiology data.  Preventive health is updated, specifically  Cancer screening and Immunization.   Questions or concerns regarding consultations or procedures which the PT has had in the interim are  Addressed.Has appt next month with pulmonary Doc.states breathing is improving. The PT denies any adverse reactions to current medications since the last visit.  Denies polyuria, polydipsia, blurred vision , or hypoglycemic episodes. Only uses short acting insulin, with metformin, reports FBG average 120 to 130  ROS Denies recent fever or chills. Denies sinus pressure, nasal congestion, ear pain or sore throat. Denies chest congestion, productive cough or wheezing.Relies on supplemental oxygen Denies chest pains, palpitations and leg swelling Denies abdominal pain, nausea, vomiting,diarrhea or constipation.   Denies dysuria, frequency, hesitancy or incontinence. Denies joint pain, swelling and limitation in mobility. Denies headaches, seizures, numbness, or tingling. Denies depression, anxiety or insomnia. Denies skin break down or rash.   PE  BP 160/90 mmHg  Pulse 83  Resp 16  Ht _0  (1.905 m)  Wt 220 lb (99.791 kg)  BMI 27.50 kg/m2  SpO2 90%  Patient alert and oriented and in no cardiopulmonary distress.  HEENT: No facial asymmetry, EOMI,   oropharynx pink and moist.  Neck supple no JVD, no mass.  Chest: Clear to auscultation bilaterally.Good air entry throughout  CVS: S1, S2 no murmurs, no S3.Regular rate.  ABD: Soft non tender.   Ext: No edema  MS: Adequate ROM spine, shoulders, hips and knees.  Skin: Intact, no ulcerations or rash noted.  Psych:  Good eye contact, normal affect. Memory intact not anxious or depressed appearing.  CNS: CN 2-12 intact, power,  normal throughout.no focal deficits noted.   Assessment & Plan  Essential hypertension Uncontrolled, not taking amlodipine, he n needs to start this Re check in 6 tio 8 weeks by nurse DASH diet and commitment to daily physical activity for a minimum of 30 minutes discussed and encouraged, as a part of hypertension management. The importance of attaining a healthy weight is also discussed.  BP/Weight 01/17/2016 10/10/2015 08/16/2015 08/07/2015 07/26/2015 05/31/2015 8/00/3491  Systolic BP 791 505 697 948 016 553 748  Diastolic BP 90 80 76 84 90 104 84  Wt. (Lbs) 220 224 215 - 217.12 214 -  BMI 27.5 28 26.87 - 27.14 26.75 -        Pulmonary hypertension Treated by pulmonary, reports improved exercise tolerance, now able to  Climb stairs Dependent on supplemental oxygen  DM (diabetes mellitus), type 2, uncontrolled, with renal complications Updated lab needed today [name] is reminded of the importance of commitment to daily physical activity for 30 minutes or more, as able and the need to limit carbohydrate intake to 30 to 60 grams per meal to help with blood sugar control.   The need to take medication as prescribed, test blood sugar as directed, and to call between visits if there is a concern that blood sugar is uncontrolled is also discussed.   [name] is reminded of the importance of daily foot exam, annual eye examination, and good blood sugar, blood pressure and cholesterol control.  Diabetic  Labs Latest Ref Rng 10/10/2015 07/26/2015 07/21/2015 05/31/2015 04/25/2015  HbA1c <5.7 % 8.1(H) - 8.0(H) - -  Microalbumin <2.0 mg/dL - 16.4(H) - - -  Micro/Creat Ratio 0.0 - 30.0 mg/g - 167.7(H) - - -  Chol 125 - 200 mg/dL 108(L) - - - -  HDL >=40 mg/dL 32(L) - - - -  Calc LDL <130 mg/dL 69 - - - -  Triglycerides <150 mg/dL 35 - - - -  Creatinine 0.70 - 1.18 mg/dL  1.25(H) - 1.20(H) 1.40(H) 1.40(H)   BP/Weight 01/17/2016 10/10/2015 08/16/2015 08/07/2015 07/26/2015 05/31/2015 06/08/2059  Systolic BP 156 153 794 327 614 709 295  Diastolic BP 90 80 76 84 90 104 84  Wt. (Lbs) 220 224 215 - 217.12 214 -  BMI 27.5 28 26.87 - 27.14 26.75 -   Foot/eye exam completion dates Latest Ref Rng 08/15/2015 07/26/2015  Eye Exam No Retinopathy No Retinopathy -  Foot Form Completion - - Done    Pt has modified his medications, will await result      Vitamin D deficiency Updated lab needed.   Hyperlipidemia with target LDL less than 100 Hyperlipidemia:Low fat diet discussed and encouraged.   Lipid Panel  Lab Results  Component Value Date   CHOL 108* 10/10/2015   HDL 32* 10/10/2015   LDLCALC 69 10/10/2015   TRIG 35 10/10/2015   CHOLHDL 3.4 10/10/2015   No med change     FATIGUE Improved with treatment of pulmonary hypertension       Review of Systems     Objective:   Physical Exam        Assessment & Plan:

## 2016-01-17 NOTE — Assessment & Plan Note (Signed)
Updated lab needed.

## 2016-01-17 NOTE — Assessment & Plan Note (Signed)
Hyperlipidemia:Low fat diet discussed and encouraged.   Lipid Panel  Lab Results  Component Value Date   CHOL 108* 10/10/2015   HDL 32* 10/10/2015   LDLCALC 69 10/10/2015   TRIG 35 10/10/2015   CHOLHDL 3.4 10/10/2015   No med change

## 2016-01-17 NOTE — Patient Instructions (Signed)
Annual wellness in 3.5 month, call if you need me sooner  Nurse BP check in 6 weeks  PLEASE add amlodipine 62m one daily to your morning medications that you take that I prescribe, your blood pressure is too high and this will also work AGAINST your problems with your lungs, So VERY important to get blood pressure right  Labs today  Thankful you are doing better   Flonase sent in and anlodipine 5 mg, call with questions when you get home  And check medications you are taking  HBA1C, cmp and EGFR for next visit, pls get 1 week before appt  PLEASE BRING ALL MEDS TO VISIT

## 2016-01-17 NOTE — Assessment & Plan Note (Signed)
Improved with treatment of pulmonary hypertension

## 2016-01-17 NOTE — Assessment & Plan Note (Signed)
Updated lab needed today [name] is reminded of the importance of commitment to daily physical activity for 30 minutes or more, as able and the need to limit carbohydrate intake to 30 to 60 grams per meal to help with blood sugar control.   The need to take medication as prescribed, test blood sugar as directed, and to call between visits if there is a concern that blood sugar is uncontrolled is also discussed.   [name] is reminded of the importance of daily foot exam, annual eye examination, and good blood sugar, blood pressure and cholesterol control.  Diabetic Labs Latest Ref Rng 10/10/2015 07/26/2015 07/21/2015 05/31/2015 04/25/2015  HbA1c <5.7 % 8.1(H) - 8.0(H) - -  Microalbumin <2.0 mg/dL - 16.4(H) - - -  Micro/Creat Ratio 0.0 - 30.0 mg/g - 167.7(H) - - -  Chol 125 - 200 mg/dL 108(L) - - - -  HDL >=40 mg/dL 32(L) - - - -  Calc LDL <130 mg/dL 69 - - - -  Triglycerides <150 mg/dL 35 - - - -  Creatinine 0.70 - 1.18 mg/dL 1.25(H) - 1.20(H) 1.40(H) 1.40(H)   BP/Weight 01/17/2016 10/10/2015 08/16/2015 08/07/2015 07/26/2015 05/31/2015 0/45/9136  Systolic BP 859 923 414 436 016 580 063  Diastolic BP 90 80 76 84 90 104 84  Wt. (Lbs) 220 224 215 - 217.12 214 -  BMI 27.5 28 26.87 - 27.14 26.75 -   Foot/eye exam completion dates Latest Ref Rng 08/15/2015 07/26/2015  Eye Exam No Retinopathy No Retinopathy -  Foot Form Completion - - Done    Pt has modified his medications, will await result

## 2016-01-17 NOTE — Assessment & Plan Note (Signed)
Treated by pulmonary, reports improved exercise tolerance, now able to  Climb stairs Dependent on supplemental oxygen

## 2016-01-17 NOTE — Assessment & Plan Note (Signed)
Uncontrolled, not taking amlodipine, he n needs to start this Re check in 6 tio 8 weeks by nurse DASH diet and commitment to daily physical activity for a minimum of 30 minutes discussed and encouraged, as a part of hypertension management. The importance of attaining a healthy weight is also discussed.  BP/Weight 01/17/2016 10/10/2015 08/16/2015 08/07/2015 07/26/2015 05/31/2015 5/43/6067  Systolic BP 703 403 524 818 590 931 121  Diastolic BP 90 80 76 84 90 104 84  Wt. (Lbs) 220 224 215 - 217.12 214 -  BMI 27.5 28 26.87 - 27.14 26.75 -

## 2016-01-18 LAB — VITAMIN D 25 HYDROXY (VIT D DEFICIENCY, FRACTURES): Vit D, 25-Hydroxy: 18 ng/mL — ABNORMAL LOW (ref 30–100)

## 2016-01-18 LAB — HEMOGLOBIN A1C
HEMOGLOBIN A1C: 7.9 % — AB (ref <5.7)
MEAN PLASMA GLUCOSE: 180 mg/dL — AB (ref <117)

## 2016-02-12 ENCOUNTER — Ambulatory Visit
Admission: RE | Admit: 2016-02-12 | Discharge: 2016-02-12 | Disposition: A | Discharge status: Home / Self Care | Payer: Medicare Other | Source: Ambulatory Visit | Attending: Specialist | Admitting: Specialist

## 2016-02-12 ENCOUNTER — Ambulatory Visit (HOSPITAL_COMMUNITY): Admission: RE | Admit: 2016-02-12 | Payer: Medicare Other | Source: Ambulatory Visit

## 2016-02-12 DIAGNOSIS — J439 Emphysema, unspecified: Secondary | ICD-10-CM | POA: Insufficient documentation

## 2016-02-12 DIAGNOSIS — R590 Localized enlarged lymph nodes: Secondary | ICD-10-CM | POA: Diagnosis present

## 2016-02-12 DIAGNOSIS — R0609 Other forms of dyspnea: Secondary | ICD-10-CM | POA: Diagnosis present

## 2016-02-12 MED ORDER — IOPAMIDOL (ISOVUE-300) INJECTION 61%
75.0000 mL | Freq: Once | INTRAVENOUS | Status: AC | PRN
  Administered 2016-02-12: 75 mL via INTRAVENOUS

## 2016-02-28 ENCOUNTER — Ambulatory Visit: Payer: Medicare Other

## 2016-02-28 VITALS — BP 150/80

## 2016-02-28 DIAGNOSIS — I10 Essential (primary) hypertension: Secondary | ICD-10-CM

## 2016-02-28 NOTE — Progress Notes (Signed)
Patient in for nurse blood pressure check.  Blood pressure checked manually.  No change in medications.  Patient aware and will keep next scheduled appointment.

## 2016-03-01 ENCOUNTER — Other Ambulatory Visit: Payer: Self-pay | Admitting: Specialist

## 2016-03-01 DIAGNOSIS — R590 Localized enlarged lymph nodes: Secondary | ICD-10-CM

## 2016-03-12 ENCOUNTER — Other Ambulatory Visit: Payer: Self-pay | Admitting: Family Medicine

## 2016-03-18 ENCOUNTER — Other Ambulatory Visit: Payer: Self-pay

## 2016-03-18 MED ORDER — AMLODIPINE BESYLATE 5 MG PO TABS: 5.0000 mg | ORAL_TABLET | Freq: Every day | ORAL | Status: DC

## 2016-03-22 ENCOUNTER — Other Ambulatory Visit: Payer: Self-pay

## 2016-03-22 DIAGNOSIS — I10 Essential (primary) hypertension: Secondary | ICD-10-CM

## 2016-03-22 MED ORDER — FUROSEMIDE 20 MG PO TABS
20.0000 mg | ORAL_TABLET | Freq: Every day | ORAL | Status: DC
Start: 2016-03-22 — End: 2018-07-28

## 2016-03-27 ENCOUNTER — Other Ambulatory Visit: Payer: Self-pay

## 2016-03-27 MED ORDER — FLUTICASONE PROPIONATE 50 MCG/ACT NA SUSP: 2.0000 | Freq: Every day | NASAL | Status: DC

## 2016-03-27 MED ORDER — AMLODIPINE BESYLATE 5 MG PO TABS: 5.0000 mg | ORAL_TABLET | Freq: Every day | ORAL | Status: DC

## 2016-04-17 ENCOUNTER — Other Ambulatory Visit: Payer: Self-pay | Admitting: Family Medicine

## 2016-05-17 LAB — HEMOGLOBIN A1C
HEMOGLOBIN A1C: 7.6 % — AB (ref <5.7)
Mean Plasma Glucose: 171 mg/dL

## 2016-05-18 LAB — COMPLETE METABOLIC PANEL WITH GFR
ALT: 11 U/L (ref 9–46)
AST: 19 U/L (ref 10–35)
Albumin: 4.1 g/dL (ref 3.6–5.1)
Alkaline Phosphatase: 55 U/L (ref 40–115)
BUN: 20 mg/dL (ref 7–25)
CHLORIDE: 108 mmol/L (ref 98–110)
CO2: 19 mmol/L — ABNORMAL LOW (ref 20–31)
Calcium: 8.9 mg/dL (ref 8.6–10.3)
Creat: 1.29 mg/dL — ABNORMAL HIGH (ref 0.70–1.18)
GFR, EST NON AFRICAN AMERICAN: 55 mL/min — AB (ref >=60)
GFR, Est African American: 64 mL/min (ref >=60)
GLUCOSE: 131 mg/dL — AB (ref 65–99)
POTASSIUM: 4.6 mmol/L (ref 3.5–5.3)
Sodium: 139 mmol/L (ref 135–146)
Total Bilirubin: 0.8 mg/dL (ref 0.2–1.2)
Total Protein: 7.2 g/dL (ref 6.1–8.1)

## 2016-05-21 ENCOUNTER — Encounter: Payer: Self-pay | Admitting: Family Medicine

## 2016-05-21 ENCOUNTER — Ambulatory Visit (INDEPENDENT_AMBULATORY_CARE_PROVIDER_SITE_OTHER): Payer: Medicare Other | Admitting: Family Medicine

## 2016-05-21 VITALS — BP 146/84 | HR 86 | Resp 18 | Ht 75.0 in | Wt 212.0 lb

## 2016-05-21 DIAGNOSIS — I272 Other secondary pulmonary hypertension: Secondary | ICD-10-CM

## 2016-05-21 DIAGNOSIS — L309 Dermatitis, unspecified: Secondary | ICD-10-CM | POA: Diagnosis not present

## 2016-05-21 DIAGNOSIS — R0902 Hypoxemia: Secondary | ICD-10-CM | POA: Diagnosis not present

## 2016-05-21 DIAGNOSIS — E119 Type 2 diabetes mellitus without complications: Secondary | ICD-10-CM | POA: Diagnosis not present

## 2016-05-21 DIAGNOSIS — Z794 Long term (current) use of insulin: Secondary | ICD-10-CM

## 2016-05-21 DIAGNOSIS — I10 Essential (primary) hypertension: Secondary | ICD-10-CM | POA: Diagnosis not present

## 2016-05-21 DIAGNOSIS — IMO0001 Reserved for inherently not codable concepts without codable children: Secondary | ICD-10-CM

## 2016-05-21 DIAGNOSIS — E785 Hyperlipidemia, unspecified: Secondary | ICD-10-CM

## 2016-05-21 MED ORDER — PREDNISONE 5 MG PO TABS: 5.0000 mg | ORAL_TABLET | Freq: Two times a day (BID) | ORAL | Status: AC

## 2016-05-21 MED ORDER — BETAMETHASONE VALERATE 0.1 % EX OINT: TOPICAL_OINTMENT | CUTANEOUS | Status: AC

## 2016-05-21 NOTE — Patient Instructions (Addendum)
aNNUAL WELLNESS IN 3 MONTH, CALL IF YOU NEED ME SOONER  Excellent labs, no change in management  Hope that test later this mionth goes well  HAPPY BELATED BIRTHDAY!!  Cream and 3 days of medication sent  For rash on back  Thank you  for choosing [facility]. We consider it a privelige to serve you.  Delivering excellent health care in a caring and  compassionate way is our goal.  Partnering with you,  so that together we can achieve this goal is our strategy.

## 2016-05-25 NOTE — Assessment & Plan Note (Signed)
Adequately Controlled, though sub optimal no change in medication

## 2016-05-25 NOTE — Assessment & Plan Note (Signed)
Hyperlipidemia:Low fat diet discussed and encouraged.   Lipid Panel  Lab Results  Component Value Date   CHOL 128 01/17/2016   HDL 30* 01/17/2016   LDLCALC 89 01/17/2016   TRIG 43 01/17/2016   CHOLHDL 4.3 01/17/2016   Sub optimal ;levels of both LDL and HDL, will rept study for next visit

## 2016-05-25 NOTE — Assessment & Plan Note (Signed)
Ongoing dependence on oxygen being treated by pulmonary team in conjunction with cardiology

## 2016-05-25 NOTE — Assessment & Plan Note (Addendum)
Controlled, no change in medication, improved and at goal [name] is reminded of the importance of commitment to daily physical activity for 30 minutes or more, as able and the need to limit carbohydrate intake to 30 to 60 grams per meal to help with blood sugar control.   The need to take medication as prescribed, test blood sugar as directed, and to call between visits if there is a concern that blood sugar is uncontrolled is also discussed.   [name] is reminded of the importance of daily foot exam, annual eye examination, and good blood sugar, blood pressure and cholesterol control.  Diabetic Labs Latest Ref Rng 05/17/2016 01/17/2016 10/10/2015 07/26/2015 07/21/2015  HbA1c <5.7 % 7.6(H) 7.9(H) 8.1(H) - 8.0(H)  Microalbumin <2.0 mg/dL - - - 16.4(H) -  Micro/Creat Ratio 0.0 - 30.0 mg/g - - - 167.7(H) -  Chol 125 - 200 mg/dL - 128 108(L) - -  HDL >=40 mg/dL - 30(L) 32(L) - -  Calc LDL <130 mg/dL - 89 69 - -  Triglycerides <150 mg/dL - 43 35 - -  Creatinine 0.70 - 1.18 mg/dL 1.29(H) 1.36(H) 1.25(H) - 1.20(H)   BP/Weight 05/21/2016 02/28/2016 01/17/2016 10/10/2015 08/16/2015 08/07/2015 06/05/3663  Systolic BP 403 474 259 563 875 643 329  Diastolic BP 84 80 90 80 76 84 90  Wt. (Lbs) 212 - 220 224 215 - 217.12  BMI 26.5 - 27.5 28 26.87 - 27.14   Foot/eye exam completion dates Latest Ref Rng 08/15/2015 07/26/2015  Eye Exam No Retinopathy No Retinopathy -  Foot Form Completion - - Done

## 2016-05-25 NOTE — Assessment & Plan Note (Signed)
Short course of oral and topical steroid prescribed, unknown trigger

## 2016-05-25 NOTE — Progress Notes (Signed)
[name]     MRN: [id number]      DOB: 1946-02-24   HPI [name] is here for follow up and re-evaluation of chronic medical conditions, medication management and review of any available recent lab and radiology data.  Preventive health is updated, specifically  Cancer screening and Immunization.   Questions or concerns regarding consultations or procedures which the PT has had in the interim are  Addressed.Currently being re evaluated by [facility] cardiology to see if there  Are any other treatments which will improve his pulmonary function, so far nothing new. The PT denies any adverse reactions to current medications since the last visit.  Denies polyuria, polydipsia, blurred vision , or hypoglycemic episodes. 2 week h/o itchy rash on his back, no new products, no drainage , fever or chills   ROS Denies recent fever or chills. Denies sinus pressure, nasal congestion, ear pain or sore throat. Denies chest congestion, productive cough or wheezing. Denies chest pains, palpitations and leg swelling Denies abdominal pain, nausea, vomiting,diarrhea or constipation.   Denies dysuria, frequency, hesitancy or incontinence. Denies joint pain, swelling and limitation in mobility. Denies headaches, seizures, numbness, or tingling. Denies depression, anxiety or insomnia.    PE  BP 146/84 mmHg  Pulse 86  Resp 18  Ht _0  (1.905 m)  Wt 212 lb (96.163 kg)  BMI 26.50 kg/m2  SpO2 93%  Patient alert and oriented and in no cardiopulmonary distress.  HEENT: No facial asymmetry, EOMI,   oropharynx pink and moist.  Neck supple no JVD, no mass.  Chest: Clear to auscultation bilaterally.decreased air entry bilaterally  CVS: S1, S2 no murmurs, no S3.Regular rate.  ABD: Soft non tender.   Ext: No edema  MS: Adequate though reduced  ROM spine, shoulders, hips and knees.  Skin: Intact,erythematous macular rash no ted on lower back.  Psych: Good eye contact, normal affect. Memory  intact not anxious or depressed appearing.  CNS: CN 2-12 intact, power,  normal throughout.no focal deficits noted.   Assessment & Plan  Essential hypertension Adequately Controlled, though sub optimal no change in medication   Diabetes mellitus, insulin dependent (IDDM), controlled (HCC) Controlled, no change in medication, improved and at goal [name] is reminded of the importance of commitment to daily physical activity for 30 minutes or more, as able and the need to limit carbohydrate intake to 30 to 60 grams per meal to help with blood sugar control.   The need to take medication as prescribed, test blood sugar as directed, and to call between visits if there is a concern that blood sugar is uncontrolled is also discussed.   [name] is reminded of the importance of daily foot exam, annual eye examination, and good blood sugar, blood pressure and cholesterol control.  Diabetic Labs Latest Ref Rng 05/17/2016 01/17/2016 10/10/2015 07/26/2015 07/21/2015  HbA1c <5.7 % 7.6(H) 7.9(H) 8.1(H) - 8.0(H)  Microalbumin <2.0 mg/dL - - - 16.4(H) -  Micro/Creat Ratio 0.0 - 30.0 mg/g - - - 167.7(H) -  Chol 125 - 200 mg/dL - 128 108(L) - -  HDL >=40 mg/dL - 30(L) 32(L) - -  Calc LDL <130 mg/dL - 89 69 - -  Triglycerides <150 mg/dL - 43 35 - -  Creatinine 0.70 - 1.18 mg/dL 1.29(H) 1.36(H) 1.25(H) - 1.20(H)   BP/Weight 05/21/2016 02/28/2016 01/17/2016 10/10/2015 08/16/2015 08/07/2015 0/81/4481  Systolic BP 856 314 970 263 785 885 027  Diastolic BP 84 80 90 80 76 84 90  Wt. (Lbs) 212 - 220 224 215 - 217.12  BMI 26.5 - 27.5 28 26.87 - 27.14   Foot/eye exam completion dates Latest Ref Rng 08/15/2015 07/26/2015  Eye Exam No Retinopathy No Retinopathy -  Foot Form Completion - - Done         Hypoxia Ongoing dependence on oxygen being treated by pulmonary team in conjunction with cardiology  Dermatitis Short course of oral and topical steroid prescribed, unknown trigger  Pulmonary  hypertension Ongoing need for oxygen, pt however continuing to "live his life" to the best of his ability, recently flew to [place] with his daughter  Hyperlipidemia with target LDL less than 100 Hyperlipidemia:Low fat diet discussed and encouraged.   Lipid Panel  Lab Results  Component Value Date   CHOL 128 01/17/2016   HDL 30* 01/17/2016   LDLCALC 89 01/17/2016   TRIG 43 01/17/2016   CHOLHDL 4.3 01/17/2016   Sub optimal ;levels of both LDL and HDL, will rept study for next visit

## 2016-05-25 NOTE — Assessment & Plan Note (Signed)
Ongoing need for oxygen, pt however continuing to "live his life" to the best of his ability, recently flew to [place] with his daughter

## 2016-05-26 IMAGING — CT CT ANGIO CHEST
2 of 5 series · 10 of 36 positions shown · IV contrast (Omnipaque 300)
Comparison: None

CLINICAL DATA: Shortness of breath for 3 weeks, chest pain, history
hyperlipidemia, hypertension, carotid bruit, diabetes mellitus,
former smoker

EXAM:
CT ANGIOGRAPHY CHEST WITH CONTRAST
TECHNIQUE: Multidetector CT imaging of the chest was performed using the
standard protocol during bolus administration of intravenous
contrast. Multiplanar CT image reconstructions and MIPs were
obtained to evaluate the vascular anatomy.
CONTRAST:  180mL OMNIPAQUE IOHEXOL 350 MG/ML SOLN total IV;
technical failure of scanner after 100 cc of contrast, requiring
reinjection of 80 cc and repeat imaging.

[Series 6: pe 3.0 b40f · axial · 0.71mm/px · z∈[-304,-79]mm · 7 of 89 slices shown]
[im 7/89  lung]
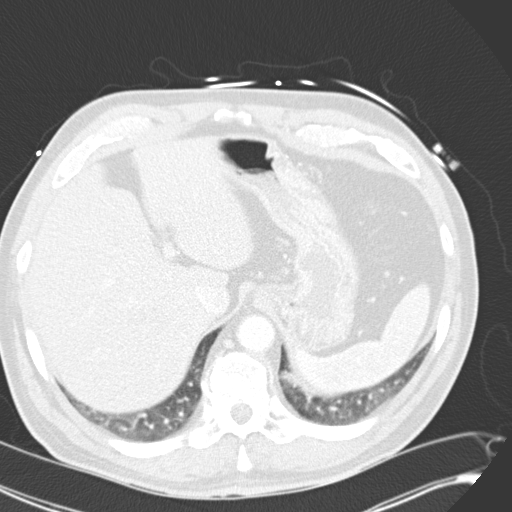
[im 19/89  mediastinal]
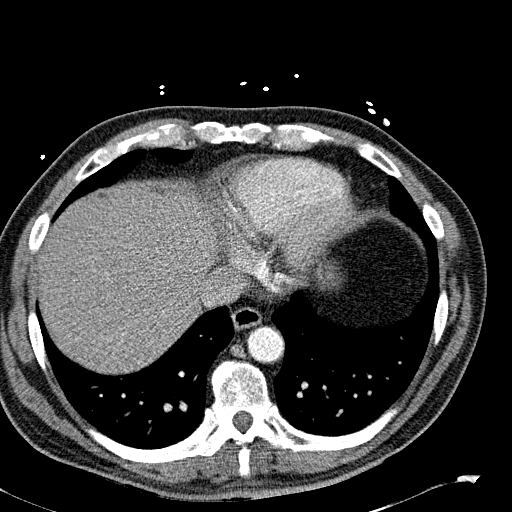
[im 32/89  lung]
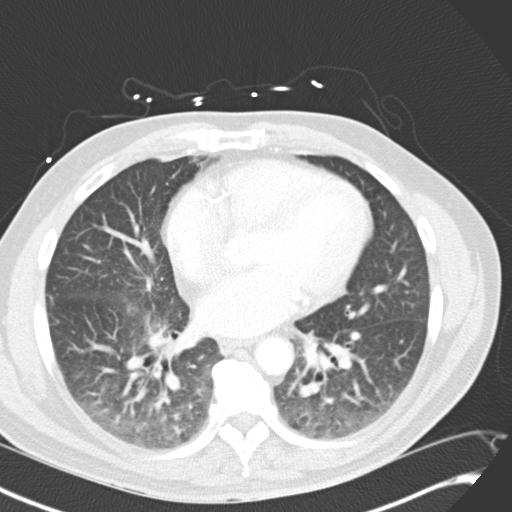
[im 45/89  mediastinal]
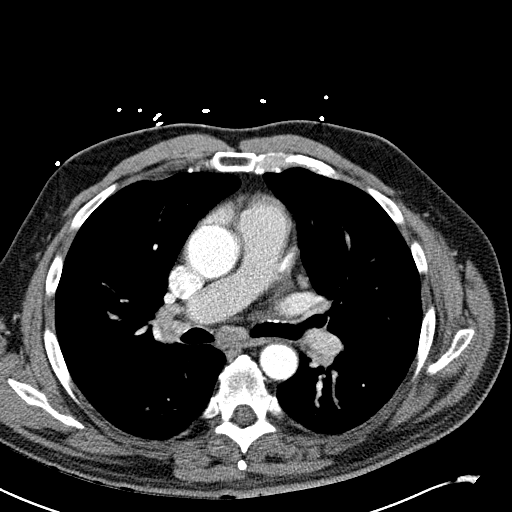
[im 57/89  lung]
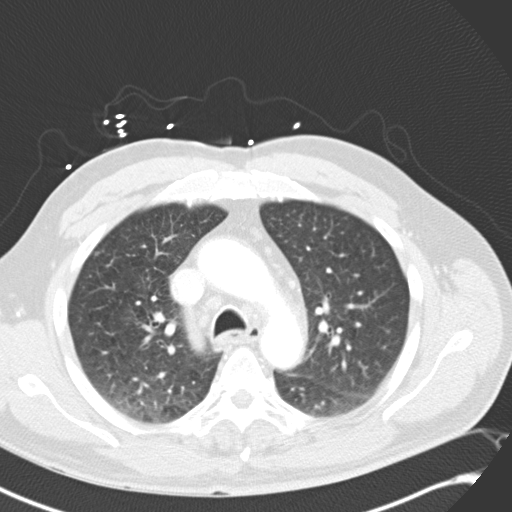
[im 70/89  mediastinal]
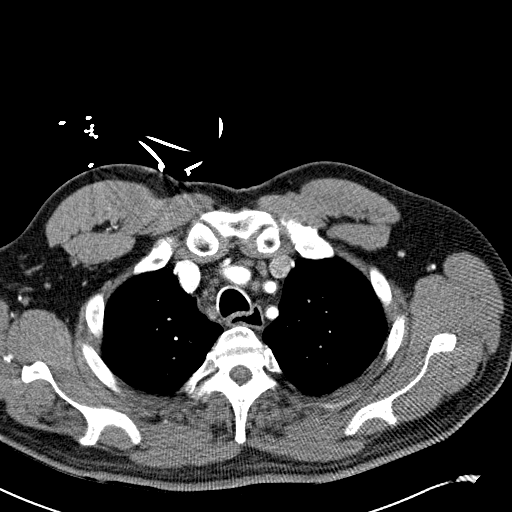
[im 82/89  lung]
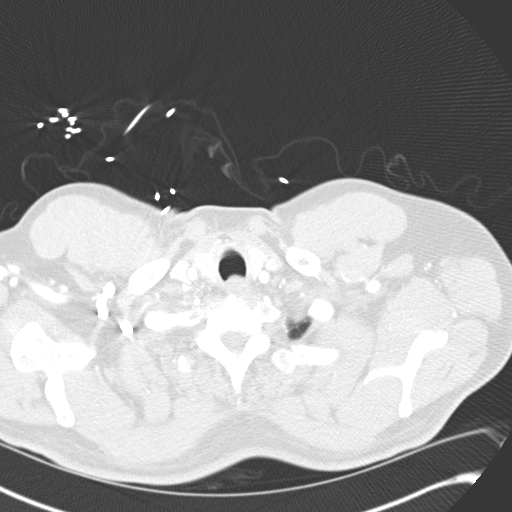

[Series 9: mpr coronal pe 3mm · coronal · 0.20mm/px · 3 of 63 slices shown]
[im 13/63  mediastinal]
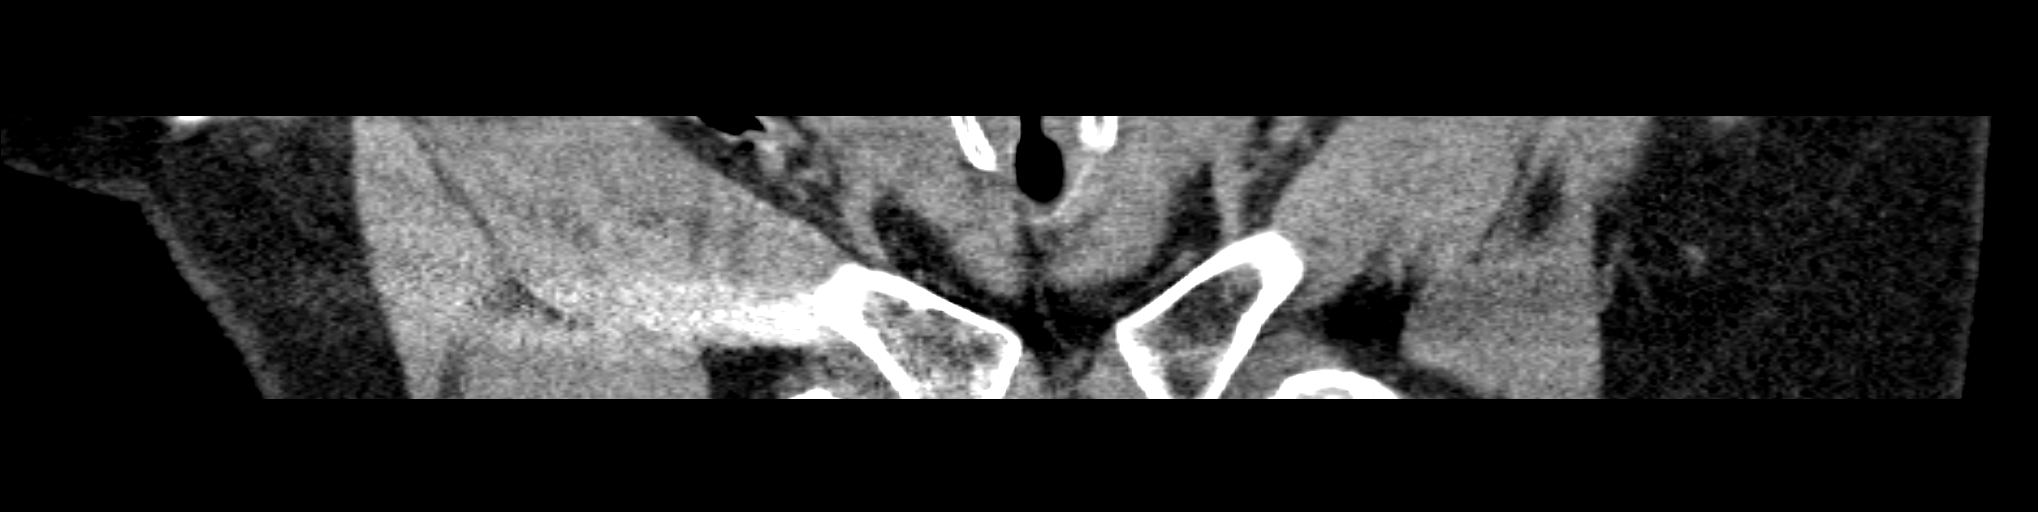
[im 25/63  mediastinal]
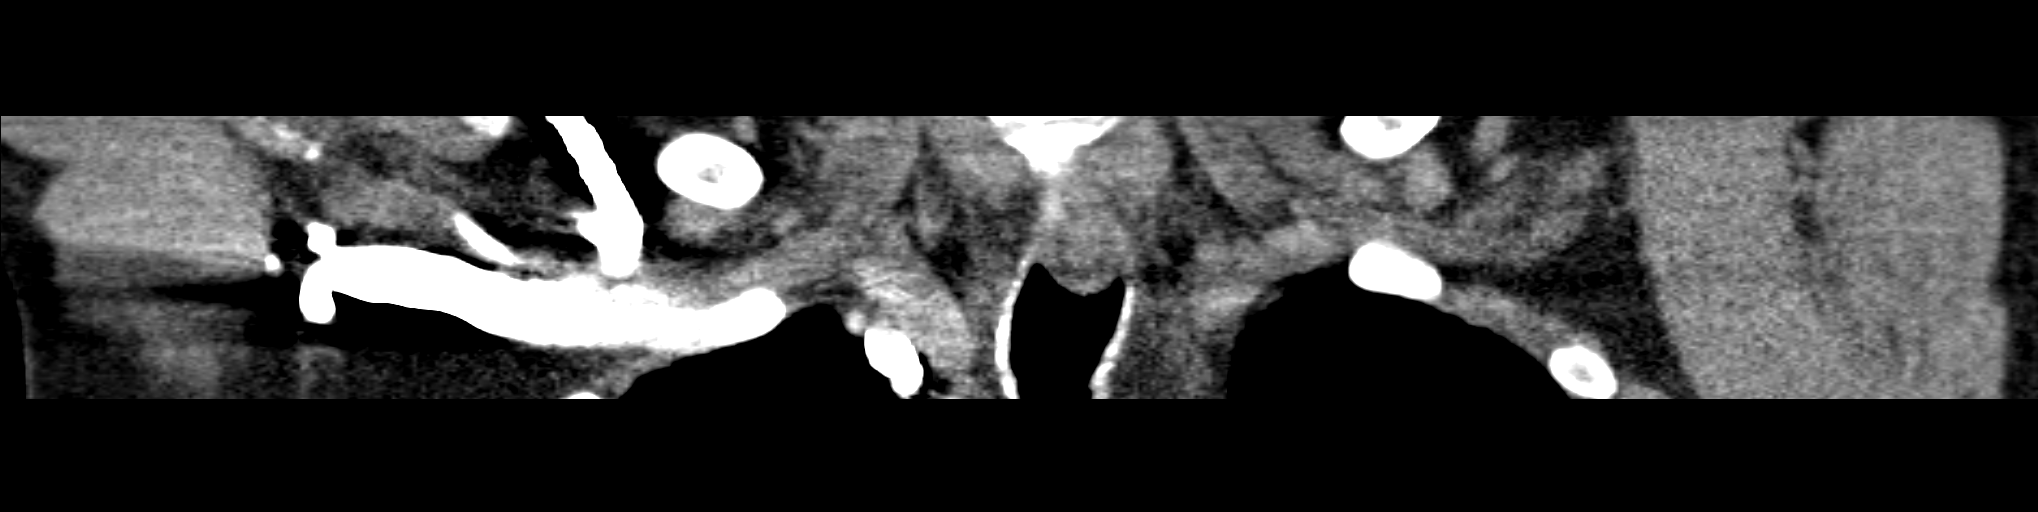
[im 38/63  mediastinal]
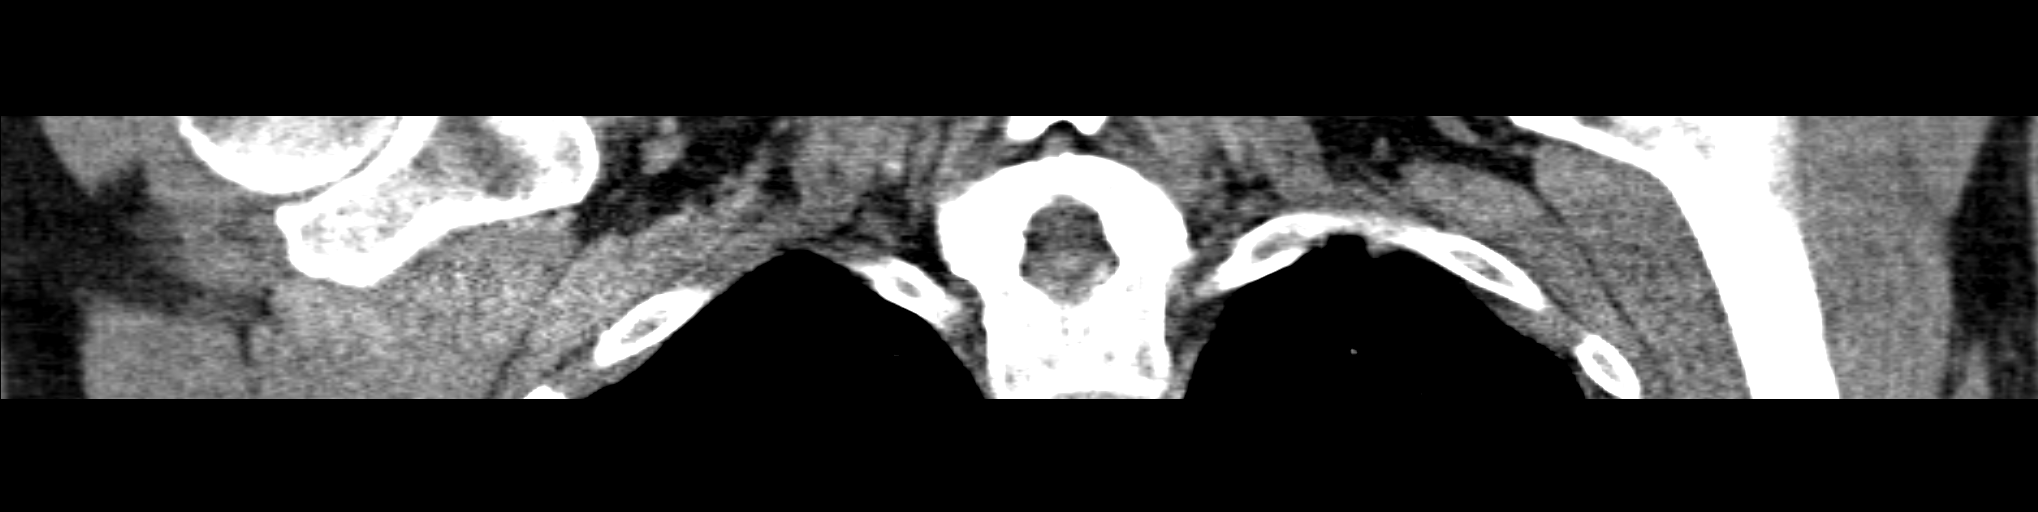

[10 of 36 positions shown; findings below may reference images not displayed]

FINDINGS: Scattered atherosclerotic calcification aorta without aneurysm or
dissection.

Pulmonary arteries well opacified and patent.

No evidence of pulmonary embolism.

Thoracic adenopathy involving mediastinum and BILATERAL hila.

RIGHT infrahilar lymph node 16 mm short axis image 58.

Subcarinal lymph node 17 mm short axis image 47.

LEFT paratracheal node at the level of the carina 15 mm short axis
image 38.

RIGHT peritracheal lymph node 11 mm short axis image 29.

Additional mediastinal and BILATERAL hilar nodes also noted.

Visualized upper abdomen unremarkable.

Dependent atelectasis in both lower lobes.

Mild scattered peribronchial thickening.

No pulmonary infiltrate, pleural effusion or pneumothorax.

No pulmonary mass/ nodule identified.

Osseous structures unremarkable.

Review of the MIP images confirms the above findings.
IMPRESSION: No evidence of pulmonary embolism.

Dependent atelectasis in both lower lobes.

Nonspecific enlarged mediastinal and hilar lymph nodes as above.

Differential diagnosis includes reactive processes, calcium in
disease, metastatic disease and lymphoma.

Consider short-term followup CT in 6-8 weeks to reassess.

## 2016-05-26 IMAGING — DX DG CHEST 2V
2 series · 2 of 2 positions shown · non-contrast
Comparison: June 20, 2009.

CLINICAL DATA: Shortness of breath for 3 weeks.

EXAM:
CHEST  2 VIEW

[chest pa]
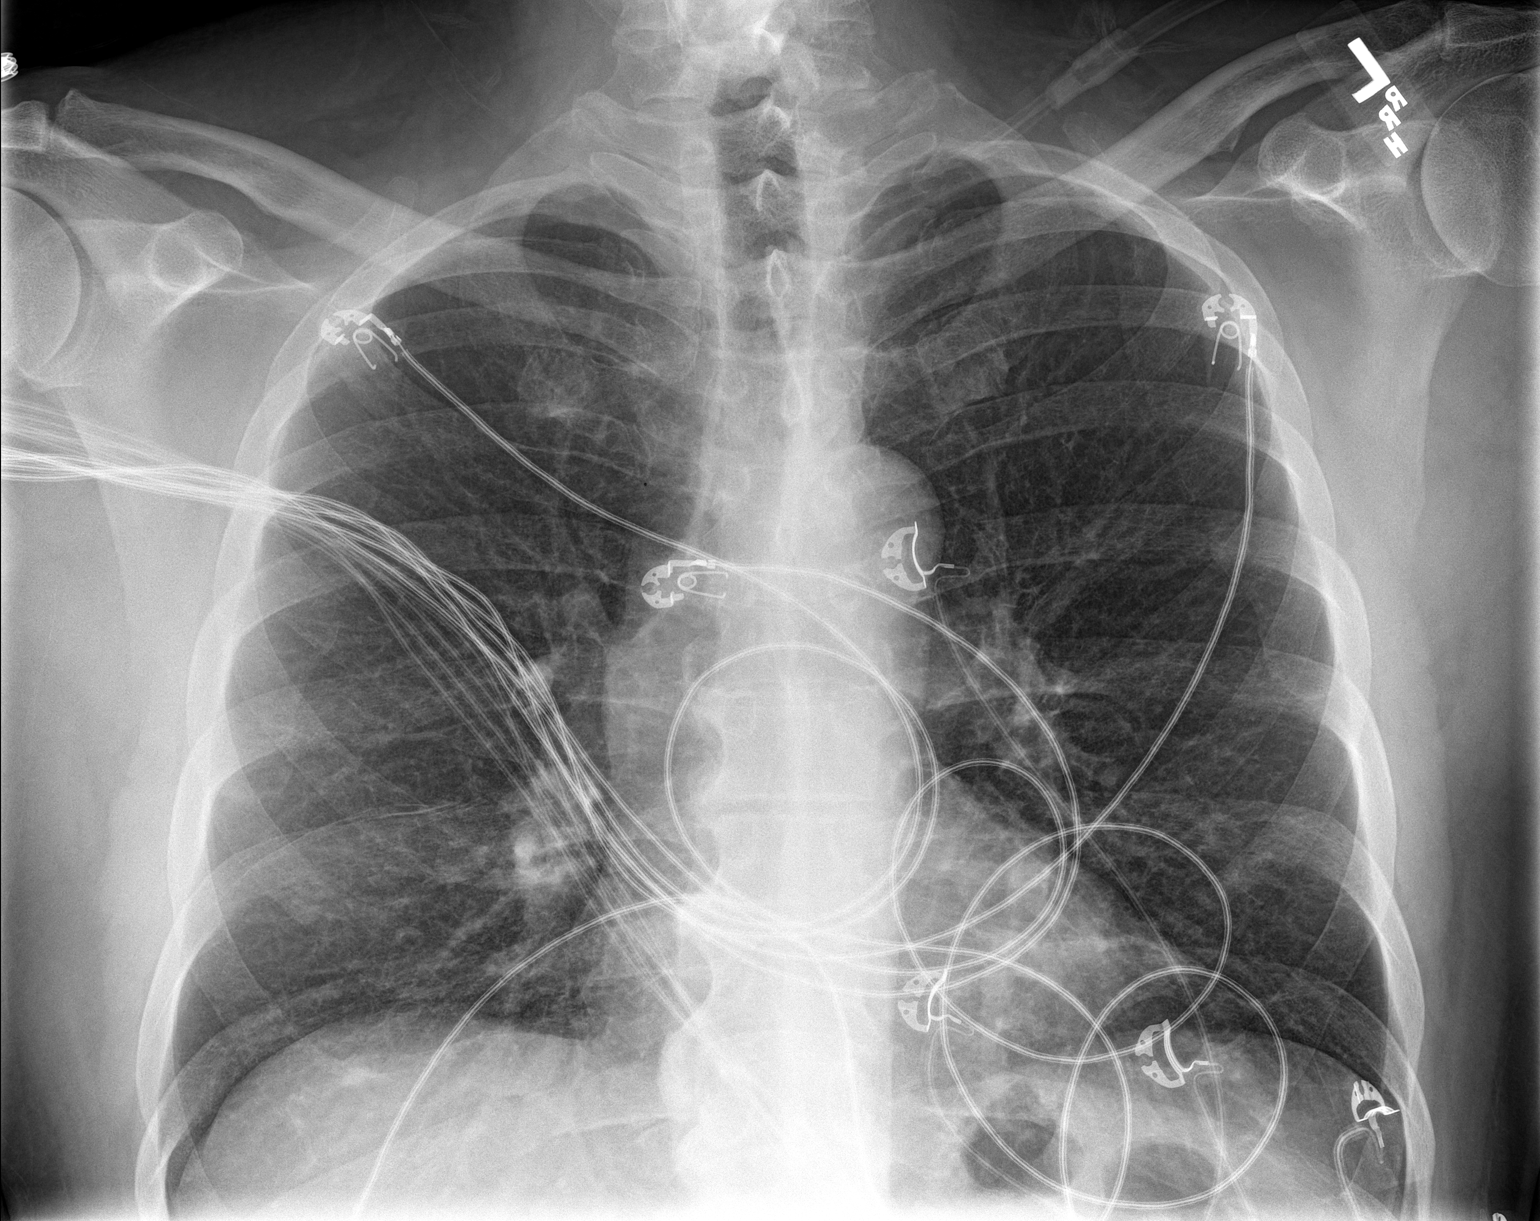

[chest lat]
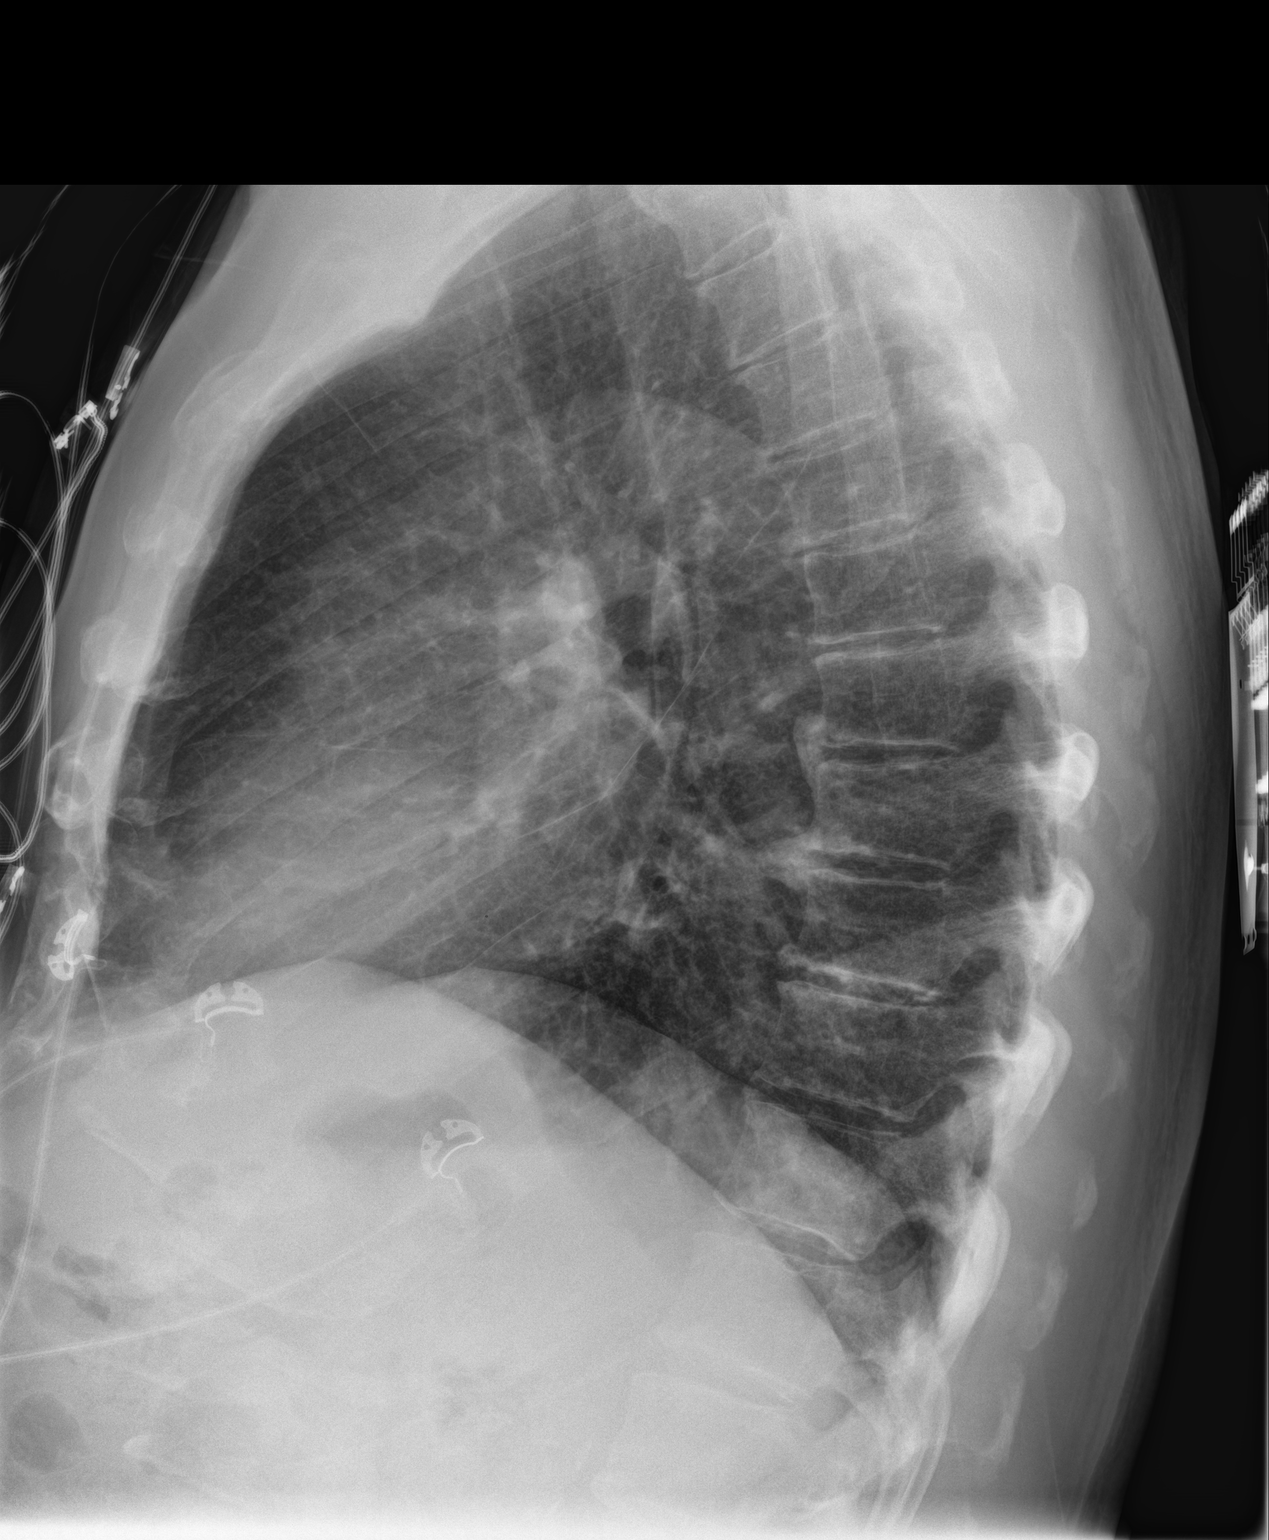

[2 of 2 positions shown; findings below may reference images not displayed]

FINDINGS: The heart size and mediastinal contours are within normal limits.
Both lungs are clear. No pneumothorax or pleural effusion is noted.
The visualized skeletal structures are unremarkable.
IMPRESSION: No acute cardiopulmonary abnormality seen.

## 2016-08-29 ENCOUNTER — Ambulatory Visit (INDEPENDENT_AMBULATORY_CARE_PROVIDER_SITE_OTHER): Payer: Medicare Other

## 2016-08-29 VITALS — BP 142/84 | HR 92 | Resp 18 | Ht 75.0 in | Wt 227.0 lb

## 2016-08-29 DIAGNOSIS — Z0000 Encounter for general adult medical examination without abnormal findings: Secondary | ICD-10-CM

## 2016-08-29 NOTE — Patient Instructions (Signed)
Thank you for choosing [facility] for your health care needs  The Annual Wellness Visit is designed to allow [place] the chance to assist you in preserving and improving you health.   Dr. Moshe Cipro will see you back in 3 months  If any labs are needed they will be mailed to you with the date to have them drawn  Please call the company about your oxygen   Please return for your flu shot or get it at the [place]

## 2016-08-29 NOTE — Progress Notes (Addendum)
Subjective:    [name] is a 70 y.o. male who presents for Medicare Annual/Subsequent preventive examination.   Preventive Screening-Counseling & Management  Tobacco History  Smoking Status  . Former Smoker  . Packs/day: 0.50  . Start date: 05/04/1964  . Quit date: 02/09/2013  Smokeless Tobacco  . Never Used     Current Problems (verified) Patient Active Problem List   Diagnosis Date Noted  . Dermatitis 05/21/2016  . Pulmonary hypertension   . Tubular adenoma of colon 05/12/2015  . Vitamin D deficiency 04/05/2015  . 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 CAD in native artery 01/02/2015  . Mediastinal adenopathy 12/07/2014  . Hypoxia 12/06/2014  . DOE (dyspnea on exertion) 12/06/2014  . Poor vision 12/06/2014  . Seasonal allergies 01/18/2014  . Diabetes mellitus, insulin dependent (IDDM), controlled ([place]) 06/28/2013  . Diabetic neuropathy ([place]) 03/24/2013  . Carotid bruit 03/24/2013  . Kidney stone on left side 01/25/2011  . PERIPHERAL VASCULAR DISEASE 05/31/2010  . FATIGUE 01/25/2010  . OTH NONSPECIFIC ABNORM CV SYSTEM FUNCTION STUDY 11/07/2009  . Hyperlipidemia with target LDL less than 100 12/14/2008  . ERECTILE DYSFUNCTION 12/14/2008  . Essential hypertension 03/09/2008    Medications Prior to Visit Current Outpatient Prescriptions on File Prior to Visit  Medication Sig Dispense Refill  . ambrisentan (LETAIRIS) 5 MG tablet Take 5 mg by mouth daily.    [name] amLODipine (NORVASC) 5 MG tablet Take 1 tablet (5 mg total) by mouth daily. 90 tablet 1  . aspirin EC 81 MG tablet Take 81 mg by mouth daily.    . ergocalciferol (VITAMIN D2) 50000 UNITS capsule Take 1 capsule (50,000 Units total) by mouth once a week. One capsule once weekly 12 capsule 3  . fluticasone (FLONASE) 50 MCG/ACT nasal spray Place 2 sprays into both nostrils daily. 48 g 1  . furosemide (LASIX) 20 MG tablet Take 1 tablet (20 mg total) by mouth daily. 90 tablet 0  . insulin aspart (NOVOLOG)  100 UNIT/ML injection Inject 8-14 Units into the skin 3 (three) times daily with meals. 150-180=8 units 200=10- units >200=12-14 units    . insulin detemir (LEVEMIR) 100 UNIT/ML injection Inject 65 Units into the skin at bedtime. Reported on 01/17/2016    . losartan (COZAAR) 100 MG tablet TAKE 1 TABLET BY MOUTH EVERY DAY 90 tablet 1  . ONE TOUCH ULTRA TEST test strip TEST BLOOD SUGAR 2 TIMES DAILY 100 each 3  . ONETOUCH DELICA LANCETS 75I MISC TEST BLOOD SUGAR TWO TIMES DAILY 100 each 0  . potassium chloride SA (K-DUR,KLOR-CON) 20 MEQ tablet Take 1 tablet (20 mEq total) by mouth daily. 30 tablet 0  . pravastatin (PRAVACHOL) 40 MG tablet TAKE 1 TABLET BY MOUTH EVERY DAY 90 tablet 1  . Tadalafil, PAH, (ADCIRCA) 20 MG TABS Take 2 tablets by mouth daily.     No current facility-administered medications on file prior to visit.     Current Medications (verified) Current Outpatient Prescriptions  Medication Sig Dispense Refill  . ambrisentan (LETAIRIS) 5 MG tablet Take 5 mg by mouth daily.    [name] amLODipine (NORVASC) 5 MG tablet Take 1 tablet (5 mg total) by mouth daily. 90 tablet 1  . aspirin EC 81 MG tablet Take 81 mg by mouth daily.    . ergocalciferol (VITAMIN D2) 50000 UNITS capsule Take 1 capsule (50,000 Units total) by mouth once a week. One capsule once weekly 12 capsule 3  . fluticasone (FLONASE) 50 MCG/ACT nasal spray Place 2 sprays  into both nostrils daily. 48 g 1  . furosemide (LASIX) 20 MG tablet Take 1 tablet (20 mg total) by mouth daily. 90 tablet 0  . insulin aspart (NOVOLOG) 100 UNIT/ML injection Inject 8-14 Units into the skin 3 (three) times daily with meals. 150-180=8 units 200=10- units >200=12-14 units    . insulin detemir (LEVEMIR) 100 UNIT/ML injection Inject 65 Units into the skin at bedtime. Reported on 01/17/2016    . losartan (COZAAR) 100 MG tablet TAKE 1 TABLET BY MOUTH EVERY DAY 90 tablet 1  . ONE TOUCH ULTRA TEST test strip TEST BLOOD SUGAR 2 TIMES DAILY 100 each 3  .  ONETOUCH DELICA LANCETS 69C MISC TEST BLOOD SUGAR TWO TIMES DAILY 100 each 0  . potassium chloride SA (K-DUR,KLOR-CON) 20 MEQ tablet Take 1 tablet (20 mEq total) by mouth daily. 30 tablet 0  . pravastatin (PRAVACHOL) 40 MG tablet TAKE 1 TABLET BY MOUTH EVERY DAY 90 tablet 1  . Tadalafil, PAH, (ADCIRCA) 20 MG TABS Take 2 tablets by mouth daily.    . Selexipag (UPTRAVI) 1000 MCG TABS Take 1 capsule by mouth as directed.     No current facility-administered medications for this visit.      Allergies (verified) Review of patient's allergies indicates no known allergies.   PAST HISTORY  Family History Family History  Problem Relation Age of Onset  . Diabetes Sister     Social History Social History  Substance Use Topics  . Smoking status: Former Smoker    Packs/day: 0.50    Start date: 05/04/1964    Quit date: 02/09/2013  . Smokeless tobacco: Never Used  . Alcohol use No    Are there smokers in your home (other than you)?  No  Risk Factors Current exercise habits: Exercise is limited by respiratory condition(s): Hypoxia.  Dietary issues discussed: Portion size and limiting carbohydrates    Cardiac risk factors: diabetes mellitus, dyslipidemia, hypertension, male gender and smoking/ tobacco exposure.  Depression Screen (Note: if answer to either of the following is "Yes", a more complete depression screening is indicated)   Q1: Over the past two weeks, have you felt down, depressed or hopeless? No  Q2: Over the past two weeks, have you felt little interest or pleasure in doing things? No  Have you lost interest or pleasure in daily life? No  Do you often feel hopeless? No  Do you cry easily over simple problems? No  Activities of Daily Living In your present state of health, do you have any difficulty performing the following activities?:  Driving? No Managing money?  No Feeding yourself? No Getting from bed to chair? No Climbing a flight of stairs? Yes-Due to respiratory  problems  Preparing food and eating?: No Bathing or showering? No Getting dressed: No Getting to the toilet? No Using the toilet:No Moving around from place to place: Yes-due to respiratory problems  In the past year have you fallen or had a near fall?:No   Are you sexually active?  Yes  Do you have more than one partner?  No  Hearing Difficulties: No Do you often ask people to speak up or repeat themselves? No Do you experience ringing or noises in your ears? No Do you have difficulty understanding soft or whispered voices? No   Do you feel that you have a problem with memory? No  Do you often misplace items? No  Do you feel safe at home?  Yes  Cognitive Testing  Alert? Yes  Normal Appearance?Yes  Oriented to person? Yes  Place? Yes   Time? Yes  Recall of three objects?  Yes  Can perform simple calculations? Yes  Displays appropriate judgment?Yes  Can read the correct time from a watch face?Yes   Advanced Directives have been discussed with the patient? Yes   List the Names of Other Physician/Practitioners you currently use: 1.  Dr. Dorris Fetch (Endocrinology) 2.  Dr. Gershon Crane (Opthamology)  3.  Dr. Vella Kohler (Pulmonology-[facility])  4.  Dr. Karena Addison (Pulmonolgy)  5.  [facility]  6.  Dr. Lauris Chroman (Cardiology) 7.  [facility]   Indicate any recent Medical Services you may have received from other than [facility] providers in the past year (date may be approximate).  Immunization History  Administered Date(s) Administered  . Pneumococcal Conjugate-13 06/14/2014  . Pneumococcal Polysaccharide-23 03/01/2008, 03/24/2013  . Zoster 06/06/2008    Screening Tests Health Maintenance  Topic Date Due  . INFLUENZA VACCINE  06/11/2016  . FOOT EXAM  07/30/2016  . OPHTHALMOLOGY EXAM  08/14/2016  . TETANUS/TDAP  01/23/2017 (Originally 05/04/1964)  . HEMOGLOBIN A1C  11/17/2016  . COLONOSCOPY  03/05/2025  . ZOSTAVAX  Completed  . Hepatitis C Screening  Completed  . PNA vac Low Risk  Adult  Completed    All answers were reviewed with the patient and necessary referrals were made:  Vanetta Mulders, LPN   [id number]   History reviewed: allergies, current medications, past family history, past medical history, past social history, past surgical history and problem list  Review of Systems A comprehensive review of systems was negative.    Objective:     Vision by Snellen chart: right eye:20/30, left eye:20/30 Blood pressure (!) 142/84, pulse 92, resp. rate 18, height _0  (1.905 m), weight 227 lb (103 kg), SpO2 90 %. Body mass index is 28.37 kg/m.  No exam performed today, annual wellness without physical exam.     Assessment:     Medicare annual wellness visit, subsequent Annual exam as documented. Counseling done  re healthy lifestyle involving commitment to 150 minutes exercise per week, heart healthy diet, and attaining healthy weight.The importance of adequate sleep also discussed. Regular seat belt use and home safety, is also discussed. Changes in health habits are decided on by the patient with goals and time frames  set for achieving them. Immunization and cancer screening needs are specifically addressed at this visit.      Plan:     During the course of the visit the patient was educated and counseled about appropriate screening and preventive services including:    Influenza vaccine  Nutrition counseling   Diet review for nutrition referral? Yes ____  Not Indicated _x___   Patient Instructions (the written plan) was given to the patient.  Medicare Attestation I have personally reviewed: The patient's medical and social history Their use of alcohol, tobacco or illicit drugs Their current medications and supplements The patient's functional ability including ADLs,fall risks, home safety risks, cognitive, and hearing and visual impairment Diet and physical activities Evidence for depression or mood disorders  The  patient's weight, height, BMI, and visual acuity have been recorded in the chart.  I have made referrals, counseling, and provided education to the patient based on review of the above and I have provided the patient with a written personalized care plan for preventive services.     Denman George [place], [place]   [id number]

## 2016-09-03 ENCOUNTER — Telehealth: Payer: Self-pay | Admitting: Family Medicine

## 2016-09-03 DIAGNOSIS — IMO0001 Reserved for inherently not codable concepts without codable children: Secondary | ICD-10-CM

## 2016-09-03 DIAGNOSIS — Z125 Encounter for screening for malignant neoplasm of prostate: Secondary | ICD-10-CM

## 2016-09-03 DIAGNOSIS — Z794 Long term (current) use of insulin: Secondary | ICD-10-CM

## 2016-09-03 DIAGNOSIS — E119 Type 2 diabetes mellitus without complications: Secondary | ICD-10-CM

## 2016-09-03 DIAGNOSIS — E785 Hyperlipidemia, unspecified: Secondary | ICD-10-CM

## 2016-09-03 DIAGNOSIS — I10 Essential (primary) hypertension: Secondary | ICD-10-CM

## 2016-09-03 DIAGNOSIS — I272 Pulmonary hypertension, unspecified: Secondary | ICD-10-CM

## 2016-09-03 NOTE — Telephone Encounter (Signed)
Needs annual physical in Nov or December.( I am working the week of Dec 26, regular schedule)  Needs fasting lipid, cmp and EGFr, hBA1C , pSA , vit D and microalb 1 week prior , pls arrange, thanks

## 2016-09-03 NOTE — Assessment & Plan Note (Signed)

## 2016-09-11 NOTE — Addendum Note (Signed)
Addended by: Eual Fines on: 09/11/2016 11:29 AM   Modules accepted: Orders

## 2016-09-11 NOTE — Telephone Encounter (Signed)
appt scheduled and lab order mailed

## 2016-10-29 ENCOUNTER — Ambulatory Visit: Payer: Medicare Other

## 2016-12-06 LAB — COMPLETE METABOLIC PANEL WITH GFR
ALT: 9 U/L (ref 9–46)
AST: 19 U/L (ref 10–35)
Albumin: 3.9 g/dL (ref 3.6–5.1)
Alkaline Phosphatase: 74 U/L (ref 40–115)
BILIRUBIN TOTAL: 1.4 mg/dL — AB (ref 0.2–1.2)
BUN: 17 mg/dL (ref 7–25)
CALCIUM: 9.5 mg/dL (ref 8.6–10.3)
CO2: 23 mmol/L (ref 20–31)
CREATININE: 1.18 mg/dL (ref 0.70–1.18)
Chloride: 107 mmol/L (ref 98–110)
GFR, EST AFRICAN AMERICAN: 71 mL/min (ref >=60)
GFR, Est Non African American: 62 mL/min (ref >=60)
Glucose, Bld: 127 mg/dL — ABNORMAL HIGH (ref 65–99)
Potassium: 4.3 mmol/L (ref 3.5–5.3)
Sodium: 139 mmol/L (ref 135–146)
TOTAL PROTEIN: 7.4 g/dL (ref 6.1–8.1)

## 2016-12-06 LAB — PSA: PSA: 0.4 ng/mL (ref <=4.0)

## 2016-12-06 LAB — LIPID PANEL
CHOLESTEROL: 139 mg/dL (ref <200)
HDL: 32 mg/dL — ABNORMAL LOW (ref >40)
LDL Cholesterol: 98 mg/dL (ref <100)
TRIGLYCERIDES: 44 mg/dL (ref <150)
Total CHOL/HDL Ratio: 4.3 Ratio (ref <5.0)
VLDL: 9 mg/dL (ref <30)

## 2016-12-06 LAB — HEMOGLOBIN A1C
Hgb A1c MFr Bld: 7.7 % — ABNORMAL HIGH (ref <5.7)
MEAN PLASMA GLUCOSE: 174 mg/dL

## 2016-12-06 LAB — MICROALBUMIN / CREATININE URINE RATIO
CREATININE, URINE: 123 mg/dL (ref 20–370)
MICROALB UR: 5.5 mg/dL
MICROALB/CREAT RATIO: 45 ug/mg{creat} — AB (ref <30)

## 2016-12-06 LAB — VITAMIN D 25 HYDROXY (VIT D DEFICIENCY, FRACTURES): VIT D 25 HYDROXY: 34 ng/mL (ref 30–100)

## 2016-12-10 ENCOUNTER — Encounter: Payer: Self-pay | Admitting: Family Medicine

## 2016-12-10 ENCOUNTER — Ambulatory Visit: Payer: Medicare Other | Admitting: Family Medicine

## 2016-12-10 ENCOUNTER — Ambulatory Visit (INDEPENDENT_AMBULATORY_CARE_PROVIDER_SITE_OTHER): Payer: Medicare Other | Admitting: Family Medicine

## 2016-12-10 VITALS — BP 146/90 | HR 88 | Temp 97.8°F | Resp 20 | Ht 75.0 in | Wt 222.0 lb

## 2016-12-10 VITALS — BP 158/90 | HR 94 | Resp 18 | Ht 75.0 in | Wt 222.0 lb

## 2016-12-10 DIAGNOSIS — Z794 Long term (current) use of insulin: Secondary | ICD-10-CM

## 2016-12-10 DIAGNOSIS — E785 Hyperlipidemia, unspecified: Secondary | ICD-10-CM | POA: Diagnosis not present

## 2016-12-10 DIAGNOSIS — I272 Pulmonary hypertension, unspecified: Secondary | ICD-10-CM

## 2016-12-10 DIAGNOSIS — L918 Other hypertrophic disorders of the skin: Secondary | ICD-10-CM

## 2016-12-10 DIAGNOSIS — E119 Type 2 diabetes mellitus without complications: Secondary | ICD-10-CM | POA: Diagnosis not present

## 2016-12-10 DIAGNOSIS — I10 Essential (primary) hypertension: Secondary | ICD-10-CM | POA: Diagnosis not present

## 2016-12-10 DIAGNOSIS — IMO0001 Reserved for inherently not codable concepts without codable children: Secondary | ICD-10-CM

## 2016-12-10 NOTE — Patient Instructions (Addendum)
F/u in 3 month, call if you need me before  You are referred for skin tag removal for Dr Meda Coffee Excellent labs  All the best with lungs and breathing  TSH, HBA1C, cmp and EGFR, CBC in 3 month Thank you  for choosing [facility]. We consider it a privelige to serve you.  Delivering excellent health care in a caring and  compassionate way is our goal.  Partnering with you,  so that together we can achieve this goal is our strategy.  Thank you  for choosing [facility]. We consider it a privelige to serve you.  Delivering excellent health care in a caring and  compassionate way is our goal.  Partnering with you,  so that together we can achieve this goal is our strategy.

## 2016-12-10 NOTE — Progress Notes (Signed)
Here for procedure Noted to have a strangulated, inflamed skin tag of back by Dr Moshe Cipro at visit.  Painful.  Consent Time out R scapula area of back noted 0.6 cm base with surrounding erythema, mild tender Pedunculated 1 cm lesion with black color and firm texture Base infiltrated with 1 cc of 1% lidocaine Shave at skin level entire lesion Silver titrate for cautery bandaid placed Wound care discussed

## 2016-12-15 ENCOUNTER — Encounter: Payer: Self-pay | Admitting: Family Medicine

## 2016-12-15 DIAGNOSIS — L918 Other hypertrophic disorders of the skin: Secondary | ICD-10-CM | POA: Insufficient documentation

## 2016-12-15 NOTE — Assessment & Plan Note (Signed)
Hyperlipidemia:Low fat diet discussed and encouraged.   Lipid Panel  Lab Results  Component Value Date   CHOL 139 12/05/2016   HDL 32 (L) 12/05/2016   LDLCALC 98 12/05/2016   TRIG 44 12/05/2016   CHOLHDL 4.3 12/05/2016    Increased physical activity as able to improve HDL, no med change

## 2016-12-15 NOTE — Assessment & Plan Note (Signed)
Uncontrolled , no med change at this time DASH diet and commitment to daily physical activity for a minimum of 30 minutes discussed and encouraged, as a part of hypertension management. The importance of attaining a healthy weight is also discussed.  BP/Weight 12/10/2016 12/10/2016 08/29/2016 05/21/2016 02/28/2016 01/17/2016 50/72/2575  Systolic BP 051 833 582 518 984 210 312  Diastolic BP 90 90 84 84 80 90 80  Wt. (Lbs) 222 222 227 212 - 220 224  BMI 27.75 27.75 28.37 26.5 - 27.5 28

## 2016-12-15 NOTE — Progress Notes (Signed)
[name]     MRN: [id number]      DOB: June 11, 1946   HPI [name] is here for follow up and re-evaluation of chronic medical conditions, medication management and review of any available recent lab and radiology data.  Preventive health is updated, specifically  Cancer screening and Immunization.   Questions or concerns regarding consultations or procedures which the PT has had in the interim are  Addressed.Reports 2 month h/o increased dyspnea, rash, loose stool, itching since starting new medication for pulmonary hypertension, has new appt at [facility] for re evaluation. Was being treated at [facility]  Denies polyuria, polydipsia, blurred vision , or hypoglycemic episodes.    ROS Denies recent fever or chills. Denies sinus pressure, nasal congestion, ear pain or sore throat. Denies chest congestion, productive cough or wheezing.Chronic and progressive shortness of breath, oxygen depenedent Denies chest pains, palpitations and leg swelling Denies abdominal pain, nausea, vomiting,diarrhea or constipation.   Denies dysuria, frequency, hesitancy or incontinence.  c/o  limitation in mobility. Denies headaches, seizures, numbness, or tingling. Denies depression, anxiety or insomnia. C/o skin tag on back wants this removed PE  BP (!) 158/90   Pulse 94   Resp 18   Ht _0  (1.905 m)   Wt 222 lb (100.7 kg)   SpO2 (!) 86%   BMI 27.75 kg/m   Patient alert and oriented and in no cardiopulmonary distress.  HEENT: No facial asymmetry, EOMI,   oropharynx pink and moist.  Neck supple no JVD, no mass.Flshed face and skin  Chest: Clear to auscultation bilaterally.Decreased air entry throughout  CVS: S1, S2 no murmurs, no S3.Regular rate.  ABD: Soft non tender.   Ext: No edema  MS: Adequate though reduced  ROM spine, shoulders, hips and knees.  Skin: Intact, no ulcerations or rash noted.Skin tag on mid back partially off Psych: Good eye contact, normal affect. Memory intact not  anxious or depressed appearing.  CNS: CN 2-12 intact, power,  normal throughout.no focal deficits noted.   Assessment & Plan  Essential hypertension Uncontrolled , no med change at this time DASH diet and commitment to daily physical activity for a minimum of 30 minutes discussed and encouraged, as a part of hypertension management. The importance of attaining a healthy weight is also discussed.  BP/Weight 12/10/2016 12/10/2016 08/29/2016 05/21/2016 02/28/2016 01/17/2016 09/47/0962  Systolic BP 836 629 476 546 503 546 568  Diastolic BP 90 90 84 84 80 90 80  Wt. (Lbs) 222 222 227 212 - 220 224  BMI 27.75 27.75 28.37 26.5 - 27.5 28       Hyperlipidemia with target LDL less than 100 Hyperlipidemia:Low fat diet discussed and encouraged.   Lipid Panel  Lab Results  Component Value Date   CHOL 139 12/05/2016   HDL 32 (L) 12/05/2016   LDLCALC 98 12/05/2016   TRIG 44 12/05/2016   CHOLHDL 4.3 12/05/2016    Increased physical activity as able to improve HDL, no med change   Diabetes mellitus, insulin dependent (IDDM), controlled ([facility]) Controlled, no change in medication [name] is reminded of the importance of commitment to daily physical activity for 30 minutes or more, as able and the need to limit carbohydrate intake to 30 to 60 grams per meal to help with blood sugar control.   The need to take medication as prescribed, test blood sugar as directed, and to call between visits if there is a concern that blood sugar is uncontrolled is also discussed.  [name] is reminded of the importance of daily foot exam, annual eye examination, and good blood sugar, blood pressure and cholesterol control.  Diabetic Labs Latest Ref Rng & Units 12/05/2016 05/17/2016 01/17/2016 10/10/2015 07/26/2015  HbA1c <5.7 % 7.7(H) 7.6(H) 7.9(H) 8.1(H) -  Microalbumin Not estab mg/dL 5.5 - - - 16.4(H)  Micro/Creat Ratio <30 mcg/mg creat 45(H) - - - 167.7(H)  Chol <200 mg/dL 139 - 128 108(L) -  HDL >40  mg/dL 32(L) - 30(L) 32(L) -  Calc LDL <100 mg/dL 98 - 89 69 -  Triglycerides <150 mg/dL 44 - 43 35 -  Creatinine 0.70 - 1.18 mg/dL 1.18 1.29(H) 1.36(H) 1.25(H) -   BP/Weight 12/10/2016 12/10/2016 08/29/2016 05/21/2016 02/28/2016 01/17/2016 37/36/6815  Systolic BP 947 076 151 834 373 578 978  Diastolic BP 90 90 84 84 80 90 80  Wt. (Lbs) 222 222 227 212 - 220 224  BMI 27.75 27.75 28.37 26.5 - 27.5 28   Foot/eye exam completion dates Latest Ref Rng & Units 08/15/2015 07/26/2015  Eye Exam No Retinopathy No Retinopathy -  Foot Form Completion - - Done        Pulmonary hypertension Reports progression of disease with intolerance to new medication started in past 3 months, has upcoming app[facility], c/o excess itch, will prescribed hydroxyzine if needed, will await f/u call as the s/e of sedation is worth holding off  Skin tag Present on mid back, partially off, refer to Dr Meda Coffee for removal

## 2016-12-15 NOTE — Assessment & Plan Note (Signed)
Controlled, no change in medication Mr. Blount is reminded of the importance of commitment to daily physical activity for 30 minutes or more, as able and the need to limit carbohydrate intake to 30 to 60 grams per meal to help with blood sugar control.   The need to take medication as prescribed, test blood sugar as directed, and to call between visits if there is a concern that blood sugar is uncontrolled is also discussed.   Mr. Bernasconi is reminded of the importance of daily foot exam, annual eye examination, and good blood sugar, blood pressure and cholesterol control.  Diabetic Labs Latest Ref Rng & Units 12/05/2016 05/17/2016 01/17/2016 10/10/2015 07/26/2015  HbA1c <5.7 % 7.7(H) 7.6(H) 7.9(H) 8.1(H) -  Microalbumin Not estab mg/dL 5.5 - - - 16.4(H)  Micro/Creat Ratio <30 mcg/mg creat 45(H) - - - 167.7(H)  Chol <200 mg/dL 139 - 128 108(L) -  HDL >40 mg/dL 32(L) - 30(L) 32(L) -  Calc LDL <100 mg/dL 98 - 89 69 -  Triglycerides <150 mg/dL 44 - 43 35 -  Creatinine 0.70 - 1.18 mg/dL 1.18 1.29(H) 1.36(H) 1.25(H) -   BP/Weight 12/10/2016 12/10/2016 08/29/2016 05/21/2016 02/28/2016 01/17/2016 01/77/9390  Systolic BP 300 923 300 762 263 335 456  Diastolic BP 90 90 84 84 80 90 80  Wt. (Lbs) 222 222 227 212 - 220 224  BMI 27.75 27.75 28.37 26.5 - 27.5 28   Foot/eye exam completion dates Latest Ref Rng & Units 08/15/2015 07/26/2015  Eye Exam No Retinopathy No Retinopathy -  Foot Form Completion - - Done

## 2016-12-15 NOTE — Assessment & Plan Note (Signed)
Present on mid back, partially off, refer to Dr Meda Coffee for removal

## 2016-12-15 NOTE — Assessment & Plan Note (Signed)
Reports progression of disease with intolerance to new medication started in past 3 months, has upcoming appt at [facility], c/o excess itch, will prescribed hydroxyzine if needed, will await f/u call as the s/e of sedation is worth holding off

## 2017-02-19 ENCOUNTER — Ambulatory Visit: Admission: RE | Admit: 2017-02-19 | Payer: Medicare Other | Source: Ambulatory Visit

## 2017-03-15 LAB — COMPLETE METABOLIC PANEL WITH GFR
ALBUMIN: 3.9 g/dL (ref 3.6–5.1)
ALT: 10 U/L (ref 9–46)
AST: 20 U/L (ref 10–35)
Alkaline Phosphatase: 64 U/L (ref 40–115)
BILIRUBIN TOTAL: 1 mg/dL (ref 0.2–1.2)
BUN: 19 mg/dL (ref 7–25)
CALCIUM: 9.1 mg/dL (ref 8.6–10.3)
CO2: 24 mmol/L (ref 20–31)
Chloride: 106 mmol/L (ref 98–110)
Creat: 1.26 mg/dL — ABNORMAL HIGH (ref 0.70–1.18)
GFR, EST NON AFRICAN AMERICAN: 57 mL/min — AB (ref >=60)
GFR, Est African American: 66 mL/min (ref >=60)
GLUCOSE: 123 mg/dL — AB (ref 65–99)
Potassium: 4.4 mmol/L (ref 3.5–5.3)
SODIUM: 140 mmol/L (ref 135–146)
TOTAL PROTEIN: 7 g/dL (ref 6.1–8.1)

## 2017-03-15 LAB — CBC
HEMATOCRIT: 47.2 % (ref 38.5–50.0)
HEMOGLOBIN: 15.6 g/dL (ref 13.2–17.1)
MCH: 29.1 pg (ref 27.0–33.0)
MCHC: 33.1 g/dL (ref 32.0–36.0)
MCV: 88.1 fL (ref 80.0–100.0)
MPV: 10.1 fL (ref 7.5–12.5)
Platelets: 134 10*3/uL — ABNORMAL LOW (ref 140–400)
RBC: 5.36 MIL/uL (ref 4.20–5.80)
RDW: 15.9 % — ABNORMAL HIGH (ref 11.0–15.0)
WBC: 4.9 10*3/uL (ref 3.8–10.8)

## 2017-03-15 LAB — TSH: TSH: 3.82 mIU/L (ref 0.40–4.50)

## 2017-03-15 LAB — HEMOGLOBIN A1C
Hgb A1c MFr Bld: 7.8 % — ABNORMAL HIGH (ref <5.7)
MEAN PLASMA GLUCOSE: 177 mg/dL

## 2017-03-18 ENCOUNTER — Ambulatory Visit (INDEPENDENT_AMBULATORY_CARE_PROVIDER_SITE_OTHER): Payer: Medicare Other | Admitting: Family Medicine

## 2017-03-18 ENCOUNTER — Encounter: Payer: Self-pay | Admitting: Family Medicine

## 2017-03-18 VITALS — BP 144/82 | HR 92 | Temp 98.4°F | Resp 20 | Ht 75.0 in | Wt 215.1 lb

## 2017-03-18 DIAGNOSIS — D126 Benign neoplasm of colon, unspecified: Secondary | ICD-10-CM | POA: Diagnosis not present

## 2017-03-18 DIAGNOSIS — IMO0001 Reserved for inherently not codable concepts without codable children: Secondary | ICD-10-CM

## 2017-03-18 DIAGNOSIS — Z794 Long term (current) use of insulin: Secondary | ICD-10-CM

## 2017-03-18 DIAGNOSIS — I10 Essential (primary) hypertension: Secondary | ICD-10-CM | POA: Diagnosis not present

## 2017-03-18 DIAGNOSIS — I272 Pulmonary hypertension, unspecified: Secondary | ICD-10-CM

## 2017-03-18 DIAGNOSIS — E119 Type 2 diabetes mellitus without complications: Secondary | ICD-10-CM

## 2017-03-18 DIAGNOSIS — E785 Hyperlipidemia, unspecified: Secondary | ICD-10-CM | POA: Diagnosis not present

## 2017-03-18 MED ORDER — AZELASTINE HCL 0.1 % NA SOLN: 2.0000 | Freq: Two times a day (BID) | NASAL | 12 refills | Status: DC

## 2017-03-18 MED ORDER — FLUTICASONE PROPIONATE 50 MCG/ACT NA SUSP: 2.0000 | Freq: Every day | NASAL | 1 refills | Status: DC

## 2017-03-18 MED ORDER — AMLODIPINE BESYLATE 5 MG PO TABS: 5.0000 mg | ORAL_TABLET | Freq: Every day | ORAL | 5 refills | Status: DC

## 2017-03-18 NOTE — Patient Instructions (Addendum)
f/u in 3.41month, call if you need me before  Please start levemir 7 to 10 units every day  Please stop diet drinks  and sweets  Start amlodipine one daily for blood pressure  Please get BIG oxygen supply tanks at home   Astelin is sent in for allergies  You are referred to Dr SGershon Cranefor eye exam,. He will call you  Fasting lipid, cmp and eGFr, hBa1C in 3.5 months,   It is important that you exercise regularly at least 30 minutes 5 times a week. If you develop chest pain, have severe difficulty breathing, or feel very tired, stop exercising immediately and seek medical attention  Please work on good  health habits so that your health will improve. 1. Commitment to daily physical activity for 30 to 60  minutes, if you are able to do this.  2. Commitment to wise food choices. Aim for half of your  food intake to be vegetable and fruit, one quarter starchy foods, and one quarter protein. Try to eat on a regular schedule  3 meals per day, snacking between meals should be limited to vegetables or fruits or small portions of nuts. 64 ounces of water per day is generally recommended, unless you have specific health conditions, like heart failure or kidney failure where you will need to limit fluid intake.  3. Commitment to sufficient and a  good quality of physical and mental rest daily, generally between 6 to 8 hours per day.  WITH PERSISTANCE AND PERSEVERANCE, THE IMPOSSIBLE , BECOMES THE NORM!
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
## 2017-03-21 NOTE — Assessment & Plan Note (Addendum)
Not at goal, Add amlodipine 5 mg daily DASH diet and commitment to daily physical activity for a minimum of 30 minutes discussed and encouraged, as a part of hypertension management. The importance of attaining a healthy weight is also discussed.  BP/Weight 03/18/2017 12/10/2016 12/10/2016 08/29/2016 05/21/2016 9/48/0165 03/13/7481  Systolic BP 707 867 544 920 100 712 197  Diastolic BP 82 90 90 84 84 80 90  Wt. (Lbs) 215.12 222 222 227 212 - 220  BMI 26.89 27.75 27.75 28.37 26.5 - 27.5

## 2017-03-21 NOTE — Assessment & Plan Note (Signed)
Reports not takijng insulin for over 1 year, he is to start low dose insulin 10 units  [name] is reminded of the importance of commitment to daily physical activity for 30 minutes or more, as able and the need to limit carbohydrate intake to 30 to 60 grams per meal to help with blood sugar control.   The need to take medication as prescribed, test blood sugar as directed, and to call between visits if there is a concern that blood sugar is uncontrolled is also discussed.   [name] is reminded of the importance of daily foot exam, annual eye examination, and good blood sugar, blood pressure and cholesterol control. Refer for eye exam  Diabetic Labs Latest Ref Rng & Units 03/14/2017 12/05/2016 05/17/2016 01/17/2016 10/10/2015  HbA1c <5.7 % 7.8(H) 7.7(H) 7.6(H) 7.9(H) 8.1(H)  Microalbumin Not estab mg/dL - 5.5 - - -  Micro/Creat Ratio <30 mcg/mg creat - 45(H) - - -  Chol <200 mg/dL - 139 - 128 108(L)  HDL >40 mg/dL - 32(L) - 30(L) 32(L)  Calc LDL <100 mg/dL - 98 - 89 69  Triglycerides <150 mg/dL - 44 - 43 35  Creatinine 0.70 - 1.18 mg/dL 1.26(H) 1.18 1.29(H) 1.36(H) 1.25(H)   BP/Weight 03/18/2017 12/10/2016 12/10/2016 08/29/2016 05/21/2016 9/74/1638 02/13/3645  Systolic BP 803 212 248 250 037 048 889  Diastolic BP 82 90 90 84 84 80 90  Wt. (Lbs) 215.12 222 222 227 212 - 220  BMI 26.89 27.75 27.75 28.37 26.5 - 27.5   Foot/eye exam completion dates Latest Ref Rng & Units 12/10/2016 08/15/2015  Eye Exam No Retinopathy - No Retinopathy  Foot Form Completion - Done -

## 2017-03-21 NOTE — Assessment & Plan Note (Signed)
Hyperlipidemia:Low fat diet discussed and encouraged.   Lipid Panel  Lab Results  Component Value Date   CHOL 139 12/05/2016   HDL 32 (L) 12/05/2016   LDLCALC 98 12/05/2016   TRIG 44 12/05/2016   CHOLHDL 4.3 12/05/2016   Updated lab needed at/ before next visit.

## 2017-03-21 NOTE — Assessment & Plan Note (Signed)
Repeat colonoscopy due in 2019

## 2017-03-21 NOTE — Assessment & Plan Note (Signed)
Has been evaluated both at [facility] and  At [facility], currently primary provider in [place], he is at baseline, home sleep study being arranged by pulmonologist due his c/o chronic fatigue

## 2017-05-13 LAB — HM DIABETES EYE EXAM

## 2017-05-13 NOTE — Patient Instructions (Signed)
Your procedure is scheduled on: 05/20/2017  Report to [facility] at  700  AM.  Call this number if you have problems the morning of surgery: [phone]   Do not eat food or drink liquids :After Midnight.      Take these medicines the morning of surgery with A SIP OF WATER: letaris, amlodipine, cozaar, adcirca. Take 1/2 of your usual insulin dosage the night before your procedure. DO NOT take any medications for diabetes the morning of your surgery.   Do not wear jewelry, make-up or nail polish.  Do not wear lotions, powders, or perfumes. You may wear deodorant.  Do not shave 48 hours prior to surgery.  Do not bring valuables to the hospital.  Contacts, dentures or bridgework may not be worn into surgery.  Leave suitcase in the car. After surgery it may be brought to your room.  For patients admitted to the hospital, checkout time is 11:00 AM the day of discharge.   Patients discharged the day of surgery will not be allowed to drive home.  :     Please read over the following fact sheets that you were given: Coughing and Deep Breathing, Surgical Site Infection Prevention, Anesthesia Post-op Instructions and Care and Recovery After Surgery    Cataract A cataract is a clouding of the lens of the eye. When a lens becomes cloudy, vision is reduced based on the degree and nature of the clouding. Many cataracts reduce vision to some degree. Some cataracts make people more near-sighted as they develop. Other cataracts increase glare. Cataracts that are ignored and become worse can sometimes look white. The white color can be seen through the pupil. CAUSES   Aging. However, cataracts may occur at any age, even in newborns.   Certain drugs.   Trauma to the eye.   Certain diseases such as diabetes.   Specific eye diseases such as chronic inflammation inside the eye or a sudden attack of a rare form of glaucoma.   Inherited or acquired medical problems.  SYMPTOMS   Gradual, progressive drop  in vision in the affected eye.   Severe, rapid visual loss. This most often happens when trauma is the cause.  DIAGNOSIS  To detect a cataract, an eye doctor examines the lens. Cataracts are best diagnosed with an exam of the eyes with the pupils enlarged (dilated) by drops.  TREATMENT  For an early cataract, vision may improve by using different eyeglasses or stronger lighting. If that does not help your vision, surgery is the only effective treatment. A cataract needs to be surgically removed when vision loss interferes with your everyday activities, such as driving, reading, or watching TV. A cataract may also have to be removed if it prevents examination or treatment of another eye problem. Surgery removes the cloudy lens and usually replaces it with a substitute lens (intraocular lens, IOL).  At a time when both you and your doctor agree, the cataract will be surgically removed. If you have cataracts in both eyes, only one is usually removed at a time. This allows the operated eye to heal and be out of danger from any possible problems after surgery (such as infection or poor wound healing). In rare cases, a cataract may be doing damage to your eye. In these cases, your caregiver may advise surgical removal right away. The vast majority of people who have cataract surgery have better vision afterward. HOME CARE INSTRUCTIONS  If you are not planning surgery, you may be asked  to do the following:  Use different eyeglasses.   Use stronger or brighter lighting.   Ask your eye doctor about reducing your medicine dose or changing medicines if it is thought that a medicine caused your cataract. Changing medicines does not make the cataract go away on its own.   Become familiar with your surroundings. Poor vision can lead to injury. Avoid bumping into things on the affected side. You are at a higher risk for tripping or falling.   Exercise extreme care when driving or operating machinery.   Wear  sunglasses if you are sensitive to bright light or experiencing problems with glare.  SEEK IMMEDIATE MEDICAL CARE IF:   You have a worsening or sudden vision loss.   You notice redness, swelling, or increasing pain in the eye.   You have a fever.  Document Released: 10/28/2005 Document Revised: 10/17/2011 Document Reviewed: 06/21/2011 [facility] Patient Information 2012 [place].PATIENT INSTRUCTIONS POST-ANESTHESIA  IMMEDIATELY FOLLOWING SURGERY:  Do not drive or operate machinery for the first twenty four hours after surgery.  Do not make any important decisions for twenty four hours after surgery or while taking narcotic pain medications or sedatives.  If you develop intractable nausea and vomiting or a severe headache please notify your doctor immediately.  FOLLOW-UP:  Please make an appointment with your surgeon as instructed. You do not need to follow up with anesthesia unless specifically instructed to do so.  WOUND CARE INSTRUCTIONS (if applicable):  Keep a dry clean dressing on the anesthesia/puncture wound site if there is drainage.  Once the wound has quit draining you may leave it open to air.  Generally you should leave the bandage intact for twenty four hours unless there is drainage.  If the epidural site drains for more than 36-48 hours please call the anesthesia department.  QUESTIONS?:  Please feel free to call your physician or the hospital operator if you have any questions, and they will be happy to assist you.

## 2017-05-15 ENCOUNTER — Encounter (HOSPITAL_COMMUNITY): Payer: Self-pay

## 2017-05-15 ENCOUNTER — Encounter (HOSPITAL_COMMUNITY)
Admission: RE | Admit: 2017-05-15 | Discharge: 2017-05-15 | Disposition: A | Discharge status: Home / Self Care | Payer: Medicare Other | Source: Ambulatory Visit | Attending: Ophthalmology | Admitting: Ophthalmology

## 2017-05-15 DIAGNOSIS — Z01818 Encounter for other preprocedural examination: Secondary | ICD-10-CM | POA: Insufficient documentation

## 2017-05-15 DIAGNOSIS — I440 Atrioventricular block, first degree: Secondary | ICD-10-CM | POA: Diagnosis not present

## 2017-05-15 DIAGNOSIS — R9431 Abnormal electrocardiogram [ECG] [EKG]: Secondary | ICD-10-CM | POA: Insufficient documentation

## 2017-05-15 DIAGNOSIS — H2512 Age-related nuclear cataract, left eye: Secondary | ICD-10-CM | POA: Insufficient documentation

## 2017-05-20 ENCOUNTER — Ambulatory Visit (HOSPITAL_COMMUNITY): Payer: Medicare Other | Admitting: Anesthesiology

## 2017-05-20 ENCOUNTER — Encounter (HOSPITAL_COMMUNITY): Payer: Self-pay | Admitting: *Deleted

## 2017-05-20 ENCOUNTER — Ambulatory Visit (HOSPITAL_COMMUNITY)
Admission: RE | Admit: 2017-05-20 | Discharge: 2017-05-20 | Disposition: A | Discharge status: Home / Self Care | Payer: Medicare Other | Source: Ambulatory Visit | Attending: Ophthalmology | Admitting: Ophthalmology

## 2017-05-20 ENCOUNTER — Encounter (HOSPITAL_COMMUNITY)
Admission: RE | Disposition: A | Discharge status: Home / Self Care | Payer: Self-pay | Source: Ambulatory Visit | Attending: Ophthalmology

## 2017-05-20 DIAGNOSIS — Z79899 Other long term (current) drug therapy: Secondary | ICD-10-CM | POA: Diagnosis not present

## 2017-05-20 DIAGNOSIS — I10 Essential (primary) hypertension: Secondary | ICD-10-CM | POA: Insufficient documentation

## 2017-05-20 DIAGNOSIS — I739 Peripheral vascular disease, unspecified: Secondary | ICD-10-CM | POA: Diagnosis not present

## 2017-05-20 DIAGNOSIS — I2510 Atherosclerotic heart disease of native coronary artery without angina pectoris: Secondary | ICD-10-CM | POA: Diagnosis not present

## 2017-05-20 DIAGNOSIS — E1136 Type 2 diabetes mellitus with diabetic cataract: Secondary | ICD-10-CM | POA: Diagnosis present

## 2017-05-20 DIAGNOSIS — J449 Chronic obstructive pulmonary disease, unspecified: Secondary | ICD-10-CM | POA: Insufficient documentation

## 2017-05-20 DIAGNOSIS — Z87891 Personal history of nicotine dependence: Secondary | ICD-10-CM | POA: Insufficient documentation

## 2017-05-20 HISTORY — PX: CATARACT EXTRACTION W/PHACO: SHX586

## 2017-05-20 LAB — HM DIABETES EYE EXAM

## 2017-05-20 LAB — GLUCOSE, CAPILLARY: GLUCOSE-CAPILLARY: 146 mg/dL — AB (ref 65–99)

## 2017-05-20 SURGERY — PHACOEMULSIFICATION, CATARACT, WITH IOL INSERTION
Anesthesia: Monitor Anesthesia Care | Site: Eye | Laterality: Right

## 2017-05-20 MED ORDER — PHENYLEPHRINE HCL 2.5 % OP SOLN
1.0000 [drp] | OPHTHALMIC | Status: AC
  Administered 2017-05-20 (×3): 1 [drp] via OPHTHALMIC

## 2017-05-20 MED ORDER — BSS IO SOLN
INTRAOCULAR | Status: DC | PRN
  Administered 2017-05-20: 15 mL

## 2017-05-20 MED ORDER — EPINEPHRINE PF 1 MG/ML IJ SOLN
INTRAOCULAR | Status: DC | PRN
  Administered 2017-05-20: 500 mL

## 2017-05-20 MED ORDER — MIDAZOLAM HCL 2 MG/2ML IJ SOLN
1.0000 mg | INTRAMUSCULAR | Status: AC
  Administered 2017-05-20: 2 mg via INTRAVENOUS
  Filled 2017-05-20: qty 2

## 2017-05-20 MED ORDER — TETRACAINE HCL 0.5 % OP SOLN
1.0000 [drp] | OPHTHALMIC | Status: AC
  Administered 2017-05-20 (×3): 1 [drp] via OPHTHALMIC

## 2017-05-20 MED ORDER — CYCLOPENTOLATE-PHENYLEPHRINE 0.2-1 % OP SOLN
1.0000 [drp] | OPHTHALMIC | Status: AC
  Administered 2017-05-20 (×3): 1 [drp] via OPHTHALMIC

## 2017-05-20 MED ORDER — KETOROLAC TROMETHAMINE 0.5 % OP SOLN
1.0000 [drp] | OPHTHALMIC | Status: AC
  Administered 2017-05-20 (×3): 1 [drp] via OPHTHALMIC

## 2017-05-20 MED ORDER — LACTATED RINGERS IV SOLN
INTRAVENOUS | Status: DC
  Administered 2017-05-20: 08:00:00 via INTRAVENOUS

## 2017-05-20 MED ORDER — FENTANYL CITRATE (PF) 100 MCG/2ML IJ SOLN
25.0000 ug | Freq: Once | INTRAMUSCULAR | Status: AC
  Administered 2017-05-20: 25 ug via INTRAVENOUS
  Filled 2017-05-20: qty 2

## 2017-05-20 MED ORDER — PROVISC 10 MG/ML IO SOLN
INTRAOCULAR | Status: DC | PRN
  Administered 2017-05-20: 0.85 mL via INTRAOCULAR

## 2017-05-20 SURGICAL SUPPLY — 11 items
CLOTH BEACON ORANGE TIMEOUT ST (SAFETY) ×3 IMPLANT
EYE SHIELD UNIVERSAL CLEAR (GAUZE/BANDAGES/DRESSINGS) ×3 IMPLANT
GLOVE BIO SURGEON STRL SZ 6.5 (GLOVE) ×2 IMPLANT
GLOVE BIO SURGEONS STRL SZ 6.5 (GLOVE) ×1
GLOVE BIOGEL PI IND STRL 7.0 (GLOVE) ×1 IMPLANT
GLOVE BIOGEL PI INDICATOR 7.0 (GLOVE) ×2
LENS ALC ACRYL/TECN (Ophthalmic Related) ×3 IMPLANT
PAD ARMBOARD 7.5X6 YLW CONV (MISCELLANEOUS) ×3 IMPLANT
TAPE SURG TRANSPORE 1 IN (GAUZE/BANDAGES/DRESSINGS) ×1 IMPLANT
TAPE SURGICAL TRANSPORE 1 IN (GAUZE/BANDAGES/DRESSINGS) ×2
WATER STERILE IRR 250ML POUR (IV SOLUTION) ×3 IMPLANT

## 2017-05-20 NOTE — Anesthesia Postprocedure Evaluation (Signed)
Anesthesia Post Note  Patient: [name]  Procedure(s) Performed: Procedure(s) (LRB): CATARACT EXTRACTION PHACO AND INTRAOCULAR LENS PLACEMENT (IOC) (Right)  Patient location during evaluation: Short Stay Anesthesia Type: MAC Level of consciousness: awake and alert, oriented and patient cooperative Pain management: pain level controlled Vital Signs Assessment: post-procedure vital signs reviewed and stable Respiratory status: spontaneous breathing, nonlabored ventilation, respiratory function stable and patient connected to nasal cannula oxygen Cardiovascular status: blood pressure returned to baseline and stable Postop Assessment: no headache, adequate PO intake and no signs of nausea or vomiting Anesthetic complications: no     Last Vitals:  Vitals:   05/20/17 0815 05/20/17 0819  BP: 130/80   Pulse:    Resp: 16 13  Temp:      Last Pain:  Vitals:   05/20/17 0740  TempSrc: Oral                 Benjamen Koelling

## 2017-05-20 NOTE — Discharge Instructions (Signed)
°  °          [facility] Instructions °[address]- [place] [address] °    ° °1. Avoid closing eyes tightly. One often closes the eye tightly when laughing, talking, sneezing, coughing or if they feel irritated. At these times, you should be careful not to close your eyes tightly. ° °2. Instill eye drops as instructed. To instill drops in your eye, open it, look up and have someone gently pull the lower lid down and instill a couple of drops inside the lower lid. ° °3. Do not touch upper lid. ° °4. Take Advil or Tylenol for pain. ° °5. You may use either eye for near work, such as reading or sewing and you may watch television. ° °6. You may have your hair done at the beauty parlor at any time. ° °7. Wear dark glasses with or without your own glasses if you are in bright light. ° °8. Call our office at [phone] or [phone] if you have sharp pain in your eye or unusual symptoms. ° °9.  FOLLOW UP WITH DR. SHAPIRO TODAY IN HIS [place] OFFICE AT 2:45pm. ° °  °I have received a copy of the above instructions and will follow them.  ° ° ° °IF YOU ARE IN IMMEDIATE DANGER CALL 911! ° °It is important for you to keep your follow-up appointment with your physician after discharge, OR, for you /your caregiver to make a follow-up appointment with your physician / medical provider after discharge. ° °Show these instructions to the next healthcare provider you see. °PATIENT INSTRUCTIONS °POST-ANESTHESIA ° °IMMEDIATELY FOLLOWING SURGERY:  Do not drive or operate machinery for the first twenty four hours after surgery.  Do not make any important decisions for twenty four hours after surgery or while taking narcotic pain medications or sedatives.  If you develop intractable nausea and vomiting or a severe headache please notify your doctor immediately. ° °FOLLOW-UP:  Please make an appointment with your surgeon as instructed. You do not need to follow up with anesthesia unless  specifically instructed to do so. ° °WOUND CARE INSTRUCTIONS (if applicable):  Keep a dry clean dressing on the anesthesia/puncture wound site if there is drainage.  Once the wound has quit draining you may leave it open to air.  Generally you should leave the bandage intact for twenty four hours unless there is drainage.  If the epidural site drains for more than 36-48 hours please call the anesthesia department. ° °QUESTIONS?:  Please feel free to call your physician or the hospital operator if you have any questions, and they will be happy to assist you.    ° ° ° °

## 2017-05-20 NOTE — Transfer of Care (Signed)
Immediate Anesthesia Transfer of Care Note  Patient: [name]  Procedure(s) Performed: Procedure(s) with comments: CATARACT EXTRACTION PHACO AND INTRAOCULAR LENS PLACEMENT (IOC) (Right) - CDE: 7.20  Patient Location: Short Stay  Anesthesia Type:MAC  Level of Consciousness: awake, alert , oriented and patient cooperative  Airway & Oxygen Therapy: Patient Spontanous Breathing and Patient connected to nasal cannula oxygen  Post-op Assessment: Report given to RN and Post -op Vital signs reviewed and stable  Post vital signs: Reviewed and stable  Last Vitals:  Vitals:   05/20/17 0815 05/20/17 0819  BP: 130/80   Pulse:    Resp: 16 13  Temp:      Last Pain:  Vitals:   05/20/17 0740  TempSrc: Oral      Patients Stated Pain Goal: 4 (18/28/83 3744)  Complications: No apparent anesthesia complications

## 2017-05-20 NOTE — H&P (Signed)
The patient was re examined and there is no change in the patients condition since the original H and P. 

## 2017-05-20 NOTE — Op Note (Signed)
Patient brought to the operating room and prepped and draped in the usual manner.  Lid speculum inserted in right eye.  Stab incision made at the twelve o'clock position.  Provisc instilled in the anterior chamber.   A 2.4 mm. Stab incision was made temporally.  An anterior capsulotomy was done with a bent 25 gauge needle.  The nucleus was hydrodissected.  The Phaco tip was inserted in the anterior chamber and the nucleus was emulsified.  CDE was 7.20.  The cortical material was then removed with the I and A tip.  Posterior capsule was the polished.  The anterior chamber was deepened with Provisc.  A 20.0 Diopter Alcon [id number] IOL was then inserted in the capsular bag.  Provisc was then removed with the I and A tip.  The wound was then hydrated.  Patient sent to the Recovery Room in good condition with follow up in my office.  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

## 2017-05-20 NOTE — Anesthesia Preprocedure Evaluation (Signed)
Anesthesia Evaluation  Patient identified by MRN, date of birth, ID band Patient awake    Reviewed: Allergy & Precautions, NPO status , Patient's Chart, lab work & pertinent test results  Airway Mallampati: II  TM Distance: >3 FB     Dental  (+) Partial Upper, Teeth Intact   Pulmonary shortness of breath and with exertion, asthma , COPD, former smoker,    breath sounds clear to auscultation       Cardiovascular hypertension, Pt. on medications + CAD, + Peripheral Vascular Disease (carotid bruit) and + DOE   Rhythm:Regular Rate:Normal     Neuro/Psych negative neurological ROS  negative psych ROS   GI/Hepatic   Endo/Other  diabetes, Type 2  Renal/GU      Musculoskeletal   Abdominal   Peds  Hematology   Anesthesia Other Findings   Reproductive/Obstetrics                             Anesthesia Physical Anesthesia Plan  ASA: III  Anesthesia Plan: MAC   Post-op Pain Management:    Induction: Intravenous  PONV Risk Score and Plan:   Airway Management Planned: Nasal Cannula  Additional Equipment:   Intra-op Plan:   Post-operative Plan:   Informed Consent: I have reviewed the patients History and Physical, chart, labs and discussed the procedure including the risks, benefits and alternatives for the proposed anesthesia with the patient or authorized representative who has indicated his/her understanding and acceptance.     Plan Discussed with:   Anesthesia Plan Comments:         Anesthesia Quick Evaluation

## 2017-05-21 ENCOUNTER — Encounter (HOSPITAL_COMMUNITY): Payer: Self-pay | Admitting: Ophthalmology

## 2017-05-29 ENCOUNTER — Encounter (HOSPITAL_COMMUNITY)
Admission: RE | Admit: 2017-05-29 | Discharge: 2017-05-29 | Disposition: A | Discharge status: Home / Self Care | Payer: Medicare Other | Source: Ambulatory Visit | Attending: Ophthalmology | Admitting: Ophthalmology

## 2017-05-29 ENCOUNTER — Encounter (HOSPITAL_COMMUNITY): Payer: Self-pay

## 2017-06-03 ENCOUNTER — Ambulatory Visit (HOSPITAL_COMMUNITY)
Admission: RE | Admit: 2017-06-03 | Discharge: 2017-06-03 | Disposition: A | Discharge status: Home / Self Care | Payer: Medicare Other | Source: Ambulatory Visit | Attending: Ophthalmology | Admitting: Ophthalmology

## 2017-06-03 ENCOUNTER — Encounter (HOSPITAL_COMMUNITY): Payer: Self-pay | Admitting: *Deleted

## 2017-06-03 ENCOUNTER — Ambulatory Visit (HOSPITAL_COMMUNITY): Payer: Medicare Other | Admitting: Anesthesiology

## 2017-06-03 ENCOUNTER — Encounter (HOSPITAL_COMMUNITY)
Admission: RE | Disposition: A | Discharge status: Home / Self Care | Payer: Self-pay | Source: Ambulatory Visit | Attending: Ophthalmology

## 2017-06-03 DIAGNOSIS — H269 Unspecified cataract: Secondary | ICD-10-CM | POA: Insufficient documentation

## 2017-06-03 DIAGNOSIS — I10 Essential (primary) hypertension: Secondary | ICD-10-CM | POA: Diagnosis not present

## 2017-06-03 DIAGNOSIS — Z87891 Personal history of nicotine dependence: Secondary | ICD-10-CM | POA: Insufficient documentation

## 2017-06-03 DIAGNOSIS — Z79899 Other long term (current) drug therapy: Secondary | ICD-10-CM | POA: Diagnosis not present

## 2017-06-03 DIAGNOSIS — E1151 Type 2 diabetes mellitus with diabetic peripheral angiopathy without gangrene: Secondary | ICD-10-CM | POA: Insufficient documentation

## 2017-06-03 DIAGNOSIS — I2510 Atherosclerotic heart disease of native coronary artery without angina pectoris: Secondary | ICD-10-CM | POA: Insufficient documentation

## 2017-06-03 DIAGNOSIS — J449 Chronic obstructive pulmonary disease, unspecified: Secondary | ICD-10-CM | POA: Diagnosis not present

## 2017-06-03 HISTORY — PX: CATARACT EXTRACTION W/PHACO: SHX586

## 2017-06-03 LAB — GLUCOSE, CAPILLARY: GLUCOSE-CAPILLARY: 155 mg/dL — AB (ref 65–99)

## 2017-06-03 SURGERY — PHACOEMULSIFICATION, CATARACT, WITH IOL INSERTION
Anesthesia: Monitor Anesthesia Care | Site: Eye | Laterality: Left

## 2017-06-03 MED ORDER — LACTATED RINGERS IV SOLN
INTRAVENOUS | Status: DC
  Administered 2017-06-03: 08:00:00 via INTRAVENOUS

## 2017-06-03 MED ORDER — FENTANYL CITRATE (PF) 100 MCG/2ML IJ SOLN
INTRAMUSCULAR | Status: AC
  Filled 2017-06-03: qty 2

## 2017-06-03 MED ORDER — BSS IO SOLN
INTRAOCULAR | Status: DC | PRN
  Administered 2017-06-03: 15 mL

## 2017-06-03 MED ORDER — MIDAZOLAM HCL 2 MG/2ML IJ SOLN
1.0000 mg | INTRAMUSCULAR | Status: AC
  Administered 2017-06-03: 2 mg via INTRAVENOUS

## 2017-06-03 MED ORDER — TETRACAINE HCL 0.5 % OP SOLN
1.0000 [drp] | OPHTHALMIC | Status: AC
  Administered 2017-06-03 (×3): 1 [drp] via OPHTHALMIC

## 2017-06-03 MED ORDER — EPINEPHRINE PF 1 MG/ML IJ SOLN
INTRAOCULAR | Status: DC | PRN
  Administered 2017-06-03: 500 mL

## 2017-06-03 MED ORDER — MIDAZOLAM HCL 2 MG/2ML IJ SOLN
INTRAMUSCULAR | Status: AC
  Filled 2017-06-03: qty 2

## 2017-06-03 MED ORDER — FENTANYL CITRATE (PF) 100 MCG/2ML IJ SOLN
25.0000 ug | Freq: Once | INTRAMUSCULAR | Status: AC
  Administered 2017-06-03: 25 ug via INTRAVENOUS

## 2017-06-03 MED ORDER — PHENYLEPHRINE HCL 2.5 % OP SOLN
1.0000 [drp] | OPHTHALMIC | Status: AC
  Administered 2017-06-03 (×3): 1 [drp] via OPHTHALMIC

## 2017-06-03 MED ORDER — CYCLOPENTOLATE-PHENYLEPHRINE 0.2-1 % OP SOLN
1.0000 [drp] | OPHTHALMIC | Status: AC
  Administered 2017-06-03 (×3): 1 [drp] via OPHTHALMIC

## 2017-06-03 MED ORDER — PROVISC 10 MG/ML IO SOLN
INTRAOCULAR | Status: DC | PRN
  Administered 2017-06-03: 0.85 mL via INTRAOCULAR

## 2017-06-03 MED ORDER — KETOROLAC TROMETHAMINE 0.5 % OP SOLN
1.0000 [drp] | OPHTHALMIC | Status: AC
  Administered 2017-06-03 (×3): 1 [drp] via OPHTHALMIC

## 2017-06-03 SURGICAL SUPPLY — 10 items
CLOTH BEACON ORANGE TIMEOUT ST (SAFETY) ×2 IMPLANT
EYE SHIELD UNIVERSAL CLEAR (GAUZE/BANDAGES/DRESSINGS) ×2 IMPLANT
GLOVE BIO SURGEON STRL SZ 6.5 (GLOVE) ×2 IMPLANT
GLOVE BIOGEL PI IND STRL 7.0 (GLOVE) ×1 IMPLANT
GLOVE BIOGEL PI INDICATOR 7.0 (GLOVE) ×1
LENS ALC ACRYL/TECN (Ophthalmic Related) ×2 IMPLANT
PAD ARMBOARD 7.5X6 YLW CONV (MISCELLANEOUS) ×2 IMPLANT
TAPE SURG TRANSPORE 1 IN (GAUZE/BANDAGES/DRESSINGS) ×1 IMPLANT
TAPE SURGICAL TRANSPORE 1 IN (GAUZE/BANDAGES/DRESSINGS) ×1
WATER STERILE IRR 250ML POUR (IV SOLUTION) ×2 IMPLANT

## 2017-06-03 NOTE — Anesthesia Preprocedure Evaluation (Signed)
Anesthesia Evaluation  Patient identified by MRN, date of birth, ID band Patient awake    Reviewed: Allergy & Precautions, NPO status , Patient's Chart, lab work & pertinent test results  Airway Mallampati: II  TM Distance: >3 FB     Dental  (+) Partial Upper, Teeth Intact   Pulmonary shortness of breath and with exertion, asthma , COPD, former smoker,    breath sounds clear to auscultation       Cardiovascular hypertension, Pt. on medications + CAD, + Peripheral Vascular Disease (carotid bruit) and + DOE   Rhythm:Regular Rate:Normal     Neuro/Psych negative neurological ROS  negative psych ROS   GI/Hepatic   Endo/Other  diabetes, Type 2  Renal/GU      Musculoskeletal   Abdominal   Peds  Hematology   Anesthesia Other Findings   Reproductive/Obstetrics                             Anesthesia Physical Anesthesia Plan  ASA: III  Anesthesia Plan: MAC   Post-op Pain Management:    Induction: Intravenous  PONV Risk Score and Plan:   Airway Management Planned: Nasal Cannula  Additional Equipment:   Intra-op Plan:   Post-operative Plan:   Informed Consent: I have reviewed the patients History and Physical, chart, labs and discussed the procedure including the risks, benefits and alternatives for the proposed anesthesia with the patient or authorized representative who has indicated his/her understanding and acceptance.     Plan Discussed with:   Anesthesia Plan Comments:         Anesthesia Quick Evaluation

## 2017-06-03 NOTE — Discharge Instructions (Signed)
°  °          [facility] Instructions [place] [address]      1. Avoid closing eyes tightly. One often closes the eye tightly when laughing, talking, sneezing, coughing or if they feel irritated. At these times, you should be careful not to close your eyes tightly.  2. Instill eye drops as instructed. To instill drops in your eye, open it, look up and have someone gently pull the lower lid down and instill a couple of drops inside the lower lid.  3. Do not touch upper lid.  4. Take Advil or Tylenol for pain.  5. You may use either eye for near work, such as reading or sewing and you may watch television.  6. You may have your hair done at the beauty parlor at any time.  7. Wear dark glasses with or without your own glasses if you are in bright light.  8. Call our office at [phone] or [phone] if you have sharp pain in your eye or unusual symptoms.  9.  FOLLOW UP WITH DR. SHAPIRO TODAY IN HIS [place] OFFICE AT 2:45pm.    I have received a copy of the above instructions and will follow them.     IF YOU ARE IN IMMEDIATE DANGER CALL 911!  It is important for you to keep your follow-up appointment with your physician after discharge, OR, for you /your caregiver to make a follow-up appointment with your physician / medical provider after discharge.  Show these instructions to the next healthcare provider you see.

## 2017-06-03 NOTE — Anesthesia Procedure Notes (Signed)
Procedure Name: MAC Date/Time: 06/03/2017 7:50 AM Performed by: Vista Deck Pre-anesthesia Checklist: Patient identified, Emergency Drugs available, Suction available, Timeout performed and Patient being monitored Patient Re-evaluated:Patient Re-evaluated prior to induction Oxygen Delivery Method: Non-rebreather mask

## 2017-06-03 NOTE — Anesthesia Postprocedure Evaluation (Signed)
Anesthesia Post Note  Patient: [name]  Procedure(s) Performed: Procedure(s) (LRB): CATARACT EXTRACTION PHACO AND INTRAOCULAR LENS PLACEMENT (IOC) (Left)  Patient location during evaluation: Short Stay Anesthesia Type: MAC Level of consciousness: awake and alert Pain management: pain level controlled Vital Signs Assessment: post-procedure vital signs reviewed and stable Respiratory status: spontaneous breathing and patient connected to nasal cannula oxygen Cardiovascular status: stable Postop Assessment: no signs of nausea or vomiting Anesthetic complications: no     Last Vitals:  Vitals:   06/03/17 0730 06/03/17 0740  BP: (!) 150/85 140/84  Resp: 15 10  Temp:      Last Pain:  Vitals:   06/03/17 0723  TempSrc: Oral                 [name]

## 2017-06-03 NOTE — Anesthesia Procedure Notes (Signed)
Procedure Name: MAC Date/Time: 06/03/2017 7:46 AM Performed by: Vista Deck Pre-anesthesia Checklist: Patient identified, Emergency Drugs available, Suction available, Timeout performed and Patient being monitored Patient Re-evaluated:Patient Re-evaluated prior to induction Oxygen Delivery Method: Nasal Cannula

## 2017-06-03 NOTE — Transfer of Care (Signed)
Immediate Anesthesia Transfer of Care Note  Patient: [name]  Procedure(s) Performed: Procedure(s) (LRB): CATARACT EXTRACTION PHACO AND INTRAOCULAR LENS PLACEMENT (IOC) (Left)  Patient Location: Shortstay  Anesthesia Type: MAC  Level of Consciousness: awake  Airway & Oxygen Therapy: Patient Spontanous Breathing   Post-op Assessment: Report given to PACU RN, Post -op Vital signs reviewed and stable and Patient moving all extremities  Post vital signs: Reviewed and stable  Complications: No apparent anesthesia complications

## 2017-06-03 NOTE — H&P (Signed)
The patient was re examined and there is no change in the patients condition since the original H and P. 

## 2017-06-03 NOTE — Op Note (Signed)
Patient brought to the operating room and prepped and draped in the usual manner.  Lid speculum inserted in left eye.  Stab incision made at the twelve o'clock position.  Provisc instilled in the anterior chamber.   A 2.4 mm. Stab incision was made temporally.  An anterior capsulotomy was done with a bent 25 gauge needle.  The nucleus was hydrodissected.  The Phaco tip was inserted in the anterior chamber and the nucleus was emulsified.  CDE was 4,89.  The cortical material was then removed with the I and A tip.  Posterior capsule was the polished.  The anterior chamber was deepened with Provisc.  A 20.5 Diopter Alcon [id number] IOL was then inserted in the capsular bag.  Provisc was then removed with the I and A tip.  The wound was then hydrated.  Patient sent to the Recovery Room in good condition with follow up in my office.  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

## 2017-06-04 ENCOUNTER — Encounter (HOSPITAL_COMMUNITY): Payer: Self-pay | Admitting: Ophthalmology

## 2017-07-01 LAB — HEMOGLOBIN A1C
Hgb A1c MFr Bld: 7.9 % — ABNORMAL HIGH (ref <5.7)
Mean Plasma Glucose: 180 mg/dL

## 2017-07-01 LAB — LIPID PANEL
CHOL/HDL RATIO: 4.5 ratio (ref <5.0)
Cholesterol: 150 mg/dL (ref <200)
HDL: 33 mg/dL — ABNORMAL LOW (ref >40)
LDL CALC: 108 mg/dL — AB (ref <100)
TRIGLYCERIDES: 46 mg/dL (ref <150)
VLDL: 9 mg/dL (ref <30)

## 2017-07-01 LAB — COMPLETE METABOLIC PANEL WITH GFR
ALBUMIN: 3.9 g/dL (ref 3.6–5.1)
ALT: 9 U/L (ref 9–46)
AST: 16 U/L (ref 10–35)
Alkaline Phosphatase: 49 U/L (ref 40–115)
BUN: 21 mg/dL (ref 7–25)
CHLORIDE: 109 mmol/L (ref 98–110)
CO2: 23 mmol/L (ref 20–32)
Calcium: 8.9 mg/dL (ref 8.6–10.3)
Creat: 1.08 mg/dL (ref 0.70–1.18)
GFR, Est African American: 79 mL/min (ref >=60)
GFR, Est Non African American: 68 mL/min (ref >=60)
GLUCOSE: 140 mg/dL — AB (ref 65–99)
POTASSIUM: 4.1 mmol/L (ref 3.5–5.3)
SODIUM: 143 mmol/L (ref 135–146)
Total Bilirubin: 1.1 mg/dL (ref 0.2–1.2)
Total Protein: 7 g/dL (ref 6.1–8.1)

## 2017-07-02 ENCOUNTER — Ambulatory Visit (INDEPENDENT_AMBULATORY_CARE_PROVIDER_SITE_OTHER): Payer: Medicare Other | Admitting: Family Medicine

## 2017-07-02 VITALS — BP 134/80 | HR 84 | Resp 16 | Ht 75.0 in | Wt 217.0 lb

## 2017-07-02 DIAGNOSIS — L089 Local infection of the skin and subcutaneous tissue, unspecified: Secondary | ICD-10-CM

## 2017-07-02 DIAGNOSIS — E785 Hyperlipidemia, unspecified: Secondary | ICD-10-CM

## 2017-07-02 DIAGNOSIS — E119 Type 2 diabetes mellitus without complications: Secondary | ICD-10-CM

## 2017-07-02 DIAGNOSIS — S90414A Abrasion, right lesser toe(s), initial encounter: Secondary | ICD-10-CM | POA: Diagnosis not present

## 2017-07-02 DIAGNOSIS — Z23 Encounter for immunization: Secondary | ICD-10-CM | POA: Diagnosis not present

## 2017-07-02 DIAGNOSIS — I10 Essential (primary) hypertension: Secondary | ICD-10-CM

## 2017-07-02 DIAGNOSIS — I272 Pulmonary hypertension, unspecified: Secondary | ICD-10-CM

## 2017-07-02 DIAGNOSIS — IMO0001 Reserved for inherently not codable concepts without codable children: Secondary | ICD-10-CM

## 2017-07-02 DIAGNOSIS — Z794 Long term (current) use of insulin: Secondary | ICD-10-CM | POA: Diagnosis not present

## 2017-07-02 MED ORDER — CLINDAMYCIN HCL 150 MG PO CAPS: 150.0000 mg | ORAL_CAPSULE | Freq: Three times a day (TID) | ORAL | 0 refills | Status: DC

## 2017-07-02 MED ORDER — ATORVASTATIN CALCIUM 10 MG PO TABS: 10.0000 mg | ORAL_TABLET | Freq: Every day | ORAL | 5 refills | Status: DC

## 2017-07-02 NOTE — Assessment & Plan Note (Signed)
After obtaining informed consent, the vaccine is  administered by LPN.  

## 2017-07-02 NOTE — Patient Instructions (Signed)
F/u in 2.5  weeks, call if you need me sooner  Start Lev emir 10 units every day for blood sugar  Keep right 4th toe clean and dry, open to air when no risk of trauma Cover loosely when you need to wear a shoe Take antibiotic cleocin one 3 times daily for 7 days total as prescribed  TdAp in office today  New for cholesterol and to reduce risk of heart die=sease is lipitor 10 mg one at night  Thankful breathing is improved

## 2017-07-05 ENCOUNTER — Encounter: Payer: Self-pay | Admitting: Family Medicine

## 2017-07-05 DIAGNOSIS — S90414A Abrasion, right lesser toe(s), initial encounter: Secondary | ICD-10-CM

## 2017-07-05 DIAGNOSIS — L089 Local infection of the skin and subcutaneous tissue, unspecified: Secondary | ICD-10-CM | POA: Insufficient documentation

## 2017-07-05 NOTE — Assessment & Plan Note (Signed)
Responding well to new treatment , improved exercise tolerance

## 2017-07-05 NOTE — Assessment & Plan Note (Signed)
Deteriorated, still not taking medication, again advised addition of daily low dose levemir, states he will start  [name] is reminded of the importance of commitment to daily physical activity for 30 minutes or more, as able and the need to limit carbohydrate intake to 30 to 60 grams per meal to help with blood sugar control.   The need to take medication as prescribed, test blood sugar as directed, and to call between visits if there is a concern that blood sugar is uncontrolled is also discussed.   [name] is reminded of the importance of daily foot exam, annual eye examination, and good blood sugar, blood pressure and cholesterol control.  Diabetic Labs Latest Ref Rng & Units 06/30/2017 03/14/2017 12/05/2016 05/17/2016 01/17/2016  HbA1c <5.7 % 7.9(H) 7.8(H) 7.7(H) 7.6(H) 7.9(H)  Microalbumin Not estab mg/dL - - 5.5 - -  Micro/Creat Ratio <30 mcg/mg creat - - 45(H) - -  Chol <200 mg/dL 150 - 139 - 128  HDL >40 mg/dL 33(L) - 32(L) - 30(L)  Calc LDL <100 mg/dL 108(H) - 98 - 89  Triglycerides <150 mg/dL 46 - 44 - 43  Creatinine 0.70 - 1.18 mg/dL 1.08 1.26(H) 1.18 1.29(H) 1.36(H)   BP/Weight 07/02/2017 06/03/2017 05/20/2017 05/15/2017 03/18/2017 12/10/2016 7/89/3810  Systolic BP 175 102 585 277 824 235 361  Diastolic BP 80 72 86 87 82 90 90  Wt. (Lbs) 217 - - 218 215.12 222 222  BMI 27.12 - - 27.25 26.89 27.75 27.75   Foot/eye exam completion dates Latest Ref Rng & Units 12/10/2016 08/15/2015  Eye Exam No Retinopathy - No Retinopathy  Foot Form Completion - Done -

## 2017-07-05 NOTE — Assessment & Plan Note (Addendum)
partially removed right great toe nail with open wound Cleaned wit N/S and dressed.Pt to clean and keep loosely covered when potentially exposed to trauma TdAP due and administered Antibiotic course prescribed with 2.5 weeks follow up

## 2017-07-05 NOTE — Assessment & Plan Note (Signed)
Controlled, no change in medication DASH diet and commitment to daily physical activity for a minimum of 30 minutes discussed and encouraged, as a part of hypertension management. The importance of attaining a healthy weight is also discussed.  BP/Weight 07/02/2017 06/03/2017 05/20/2017 05/15/2017 03/18/2017 12/10/2016 2/70/6237  Systolic BP 628 315 176 160 737 106 269  Diastolic BP 80 72 86 87 82 90 90  Wt. (Lbs) 217 - - 218 215.12 222 222  BMI 27.12 - - 27.25 26.89 27.75 27.75

## 2017-07-05 NOTE — Progress Notes (Signed)
[name]     MRN: [id number]      DOB: [birth year]   HPI [name] is here for follow up and re-evaluation of chronic medical conditions, medication management and review of any available recent lab and radiology data.  Preventive health is updated, specifically  Cancer screening and Immunization.   Questions or concerns regarding consultations or procedures which the PT has had in the interim are  addressed. The PT denies any adverse reactions to current medications since the last visit.  Denies polyuria, polydipsia, blurred vision , or hypoglycemic episodes. Not taking any medication for diabetes still though advised to do so Pulled at toenail recently  ROS Denies recent fever or chills. Denies sinus pressure, nasal congestion, ear pain or sore throat. Denies chest congestion, productive cough or wheezing. Denies chest pains, palpitations and leg swelling Denies abdominal pain, nausea, vomiting,diarrhea or constipation.   Denies dysuria, frequency, hesitancy or incontinence. Denies joint pain, swelling and limitation in mobility. Denies headaches, seizures, numbness, or tingling.  BP 134/80   Pulse 84   Resp 16   Ht _0  (1.905 m)   Wt 217 lb (98.4 kg)   SpO2 (!) 85% Comment: needs 4l oxygen  BMI 27.12 kg/m   Patient alert and oriented and in no cardiopulmonary distress.  HEENT: No facial asymmetry, EOMI,   oropharynx pink and moist.  Neck supple no JVD, no mass.  Chest: Clear to auscultation bilaterally.decreased air entry  CVS: S1, S2 no murmurs, no S3.Regular rate.  ABD: Soft non tender.   Ext: No edema  MS: Adequate ROM spine, shoulders, hips and knees.  Skin: Breakdown of skin on 4th right toenail bed with partially removed nail, no purulent drainage  Psych: Good eye contact, normal affect. Memory intact not anxious or depressed appearing.  CNS: CN 2-12 intact, power,  normal throughout.no focal deficits noted.   Assessment & Plan  Need for  Tdap vaccination After obtaining informed consent, the vaccine is  administered by LPN.    Infected abrasion of fourth toe of right foot partially removed right great toe nail with open wound Cleaned wit N/S and dressed.Pt to clean and keep loosely covered when potentially exposed to trauma TdAP due and administered Antibiotic course prescribed with 2.5 weeks follow up  Essential hypertension Controlled, no change in medication DASH diet and commitment to daily physical activity for a minimum of 30 minutes discussed and encouraged, as a part of hypertension management. The importance of attaining a healthy weight is also discussed.  BP/Weight 07/02/2017 06/03/2017 05/20/2017 05/15/2017 03/18/2017 12/10/2016 0/62/3762  Systolic BP 831 517 616 073 710 626 948  Diastolic BP 80 72 86 87 82 90 90  Wt. (Lbs) 217 - - 218 215.12 222 222  BMI 27.12 - - 27.25 26.89 27.75 27.75       Hyperlipidemia with target LDL less than 100 Hyperlipidemia:Low fat diet discussed and encouraged.   Lipid Panel  Lab Results  Component Value Date   CHOL 150 06/30/2017   HDL 33 (L) 06/30/2017   LDLCALC 108 (H) 06/30/2017   TRIG 46 06/30/2017   CHOLHDL 4.5 06/30/2017   Notat goal needs to reduce fat intake    Pulmonary hypertension ([place]) Responding well to new treatment , improved exercise tolerance  Diabetes mellitus, insulin dependent (IDDM), controlled ([place]) Deteriorated, still not taking medication, again advised addition of daily low dose levemir, states he will start  [name] is reminded of the importance of commitment to daily physical  activity for 30 minutes or more, as able and the need to limit carbohydrate intake to 30 to 60 grams per meal to help with blood sugar control.   The need to take medication as prescribed, test blood sugar as directed, and to call between visits if there is a concern that blood sugar is uncontrolled is also discussed.   [name] is reminded of the  importance of daily foot exam, annual eye examination, and good blood sugar, blood pressure and cholesterol control.  Diabetic Labs Latest Ref Rng & Units 06/30/2017 03/14/2017 12/05/2016 05/17/2016 01/17/2016  HbA1c <5.7 % 7.9(H) 7.8(H) 7.7(H) 7.6(H) 7.9(H)  Microalbumin Not estab mg/dL - - 5.5 - -  Micro/Creat Ratio <30 mcg/mg creat - - 45(H) - -  Chol <200 mg/dL 150 - 139 - 128  HDL >40 mg/dL 33(L) - 32(L) - 30(L)  Calc LDL <100 mg/dL 108(H) - 98 - 89  Triglycerides <150 mg/dL 46 - 44 - 43  Creatinine 0.70 - 1.18 mg/dL 1.08 1.26(H) 1.18 1.29(H) 1.36(H)   BP/Weight 07/02/2017 06/03/2017 05/20/2017 05/15/2017 03/18/2017 12/10/2016 06/04/5000  Systolic BP 642 903 795 583 167 425 525  Diastolic BP 80 72 86 87 82 90 90  Wt. (Lbs) 217 - - 218 215.12 222 222  BMI 27.12 - - 27.25 26.89 27.75 27.75   Foot/eye exam completion dates Latest Ref Rng & Units 12/10/2016 08/15/2015  Eye Exam No Retinopathy - No Retinopathy  Foot Form Completion - Done -

## 2017-07-05 NOTE — Assessment & Plan Note (Signed)
Hyperlipidemia:Low fat diet discussed and encouraged.   Lipid Panel  Lab Results  Component Value Date   CHOL 150 06/30/2017   HDL 33 (L) 06/30/2017   LDLCALC 108 (H) 06/30/2017   TRIG 46 06/30/2017   CHOLHDL 4.5 06/30/2017   Notat goal needs to reduce fat intake

## 2017-07-15 LAB — HM DIABETES EYE EXAM

## 2017-07-16 ENCOUNTER — Encounter: Payer: Self-pay | Admitting: Family Medicine

## 2017-07-16 ENCOUNTER — Ambulatory Visit: Payer: Self-pay | Admitting: Family Medicine

## 2017-07-16 VITALS — BP 130/76 | HR 85 | Temp 98.0°F | Resp 16 | Ht 75.0 in | Wt 207.2 lb

## 2017-07-16 DIAGNOSIS — L089 Local infection of the skin and subcutaneous tissue, unspecified: Secondary | ICD-10-CM

## 2017-07-16 DIAGNOSIS — S90414S Abrasion, right lesser toe(s), sequela: Principal | ICD-10-CM

## 2017-07-16 NOTE — Progress Notes (Signed)
Toenail examined and is totally healed, No infection present. Treated as a Nurse visit, Pt not to be billed a co pay

## 2017-07-26 NOTE — Assessment & Plan Note (Signed)
Infection fully resolved, no erythema or drainage noted

## 2017-07-26 NOTE — Patient Instructions (Addendum)
F/u appt to be made to see MD first week in December  Needs Non fast HBA1C chem7 and egfr 11/25 or after

## 2017-07-30 ENCOUNTER — Telehealth: Payer: Self-pay | Admitting: Family Medicine

## 2017-07-30 DIAGNOSIS — IMO0001 Reserved for inherently not codable concepts without codable children: Secondary | ICD-10-CM

## 2017-07-30 DIAGNOSIS — E119 Type 2 diabetes mellitus without complications: Principal | ICD-10-CM

## 2017-07-30 DIAGNOSIS — Z794 Long term (current) use of insulin: Principal | ICD-10-CM

## 2017-07-30 DIAGNOSIS — I272 Pulmonary hypertension, unspecified: Secondary | ICD-10-CM

## 2017-07-30 NOTE — Telephone Encounter (Signed)
-----  Message from Fayrene Helper, MD sent at 07/30/2017  9:33 AM EDT ----- Regarding: f/u appt and labs for appt please Good day,  This pt will need a f/u appointment scheduled for the first week in December  He also needs Non fasting HBA1C, chem 7 and EGFR labwork done at least 5 days before the visit pls  Thank you both!

## 2017-08-18 ENCOUNTER — Other Ambulatory Visit: Payer: Self-pay

## 2017-08-18 MED ORDER — AMLODIPINE BESYLATE 5 MG PO TABS: 5.0000 mg | ORAL_TABLET | Freq: Every day | ORAL | 5 refills | Status: DC

## 2017-08-25 ENCOUNTER — Other Ambulatory Visit: Payer: Self-pay

## 2017-08-25 MED ORDER — ATORVASTATIN CALCIUM 10 MG PO TABS: 10.0000 mg | ORAL_TABLET | Freq: Every day | ORAL | 1 refills | Status: DC

## 2017-08-25 NOTE — Telephone Encounter (Signed)
Seen [phone]

## 2017-08-26 ENCOUNTER — Other Ambulatory Visit: Payer: Self-pay

## 2017-09-12 ENCOUNTER — Other Ambulatory Visit: Payer: Self-pay

## 2017-09-12 MED ORDER — AZELASTINE HCL 0.1 % NA SOLN: 2.0000 | Freq: Two times a day (BID) | NASAL | 1 refills | Status: DC

## 2017-10-10 LAB — HEMOGLOBIN A1C
HEMOGLOBIN A1C: 8.2 %{Hb} — AB (ref <5.7)
MEAN PLASMA GLUCOSE: 189 (calc)
eAG (mmol/L): 10.4 (calc)

## 2017-10-10 LAB — COMPLETE METABOLIC PANEL WITH GFR
AG RATIO: 1.4 (calc) (ref 1.0–2.5)
ALT: 12 U/L (ref 9–46)
AST: 18 U/L (ref 10–35)
Albumin: 4.1 g/dL (ref 3.6–5.1)
Alkaline phosphatase (APISO): 48 U/L (ref 40–115)
BILIRUBIN TOTAL: 0.8 mg/dL (ref 0.2–1.2)
BUN/Creatinine Ratio: 18 (calc) (ref 6–22)
BUN: 22 mg/dL (ref 7–25)
CALCIUM: 9.3 mg/dL (ref 8.6–10.3)
CO2: 24 mmol/L (ref 20–32)
Chloride: 107 mmol/L (ref 98–110)
Creat: 1.19 mg/dL — ABNORMAL HIGH (ref 0.70–1.18)
GFR, EST NON AFRICAN AMERICAN: 61 mL/min/{1.73_m2} (ref >=60)
GFR, Est African American: 70 mL/min/{1.73_m2} (ref >=60)
GLOBULIN: 3 g/dL (ref 1.9–3.7)
Glucose, Bld: 131 mg/dL — ABNORMAL HIGH (ref 65–99)
POTASSIUM: 4.6 mmol/L (ref 3.5–5.3)
SODIUM: 140 mmol/L (ref 135–146)
Total Protein: 7.1 g/dL (ref 6.1–8.1)

## 2017-10-14 ENCOUNTER — Ambulatory Visit: Payer: Medicare Other | Admitting: Family Medicine

## 2017-10-14 ENCOUNTER — Encounter: Payer: Self-pay | Admitting: Family Medicine

## 2017-10-14 VITALS — BP 130/70 | HR 78 | Resp 14 | Ht 75.0 in | Wt 214.0 lb

## 2017-10-14 DIAGNOSIS — E785 Hyperlipidemia, unspecified: Secondary | ICD-10-CM | POA: Diagnosis not present

## 2017-10-14 DIAGNOSIS — Z794 Long term (current) use of insulin: Secondary | ICD-10-CM | POA: Diagnosis not present

## 2017-10-14 DIAGNOSIS — IMO0001 Reserved for inherently not codable concepts without codable children: Secondary | ICD-10-CM

## 2017-10-14 DIAGNOSIS — E119 Type 2 diabetes mellitus without complications: Secondary | ICD-10-CM

## 2017-10-14 DIAGNOSIS — I272 Pulmonary hypertension, unspecified: Secondary | ICD-10-CM | POA: Diagnosis not present

## 2017-10-14 DIAGNOSIS — Z125 Encounter for screening for malignant neoplasm of prostate: Secondary | ICD-10-CM

## 2017-10-14 DIAGNOSIS — I10 Essential (primary) hypertension: Secondary | ICD-10-CM

## 2017-10-14 NOTE — Assessment & Plan Note (Signed)
Controlled, no change in medication DASH diet and commitment to daily physical activity for a minimum of 30 minutes discussed and encouraged, as a part of hypertension management. The importance of attaining a healthy weight is also discussed.  BP/Weight 10/14/2017 07/16/2017 07/02/2017 06/03/2017 05/20/2017 03/19/4706 04/11/5182  Systolic BP 437 357 897 847 841 282 081  Diastolic BP 70 76 80 72 86 87 82  Wt. (Lbs) 214 207.25 217 - - 218 215.12  BMI 26.75 25.9 27.12 - - 27.25 26.89

## 2017-10-14 NOTE — Progress Notes (Signed)
[name]     MRN: [id number]      DOB: 1946/05/22   HPI [name] is here for follow up and re-evaluation of chronic medical conditions, medication management and review of any available recent lab and radiology data.  Preventive health is updated, specifically  Cancer screening and Immunization. Refuses flu vaccine  Questions or concerns regarding consultations or procedures which the PT has had in the interim are  addressed. The PT denies any adverse reactions to current medications since the last visit. Still has not been taking the insulin recommended and his blood sugar control has worsened There are no new concerns.  There are no specific complaints   ROS Denies recent fever or chills. Denies sinus pressure, nasal congestion, ear pain or sore throat. Denies chest congestion, productive cough or wheezing.c/o shortness of breath wit activity but states overall his breathing is as good as its been Denies chest pains, palpitations and leg swelling Denies abdominal pain, nausea, vomiting,diarrhea or constipation.   Denies dysuria, frequency, hesitancy or incontinence. Denies joint pain, swelling and limitation in mobility. Denies headaches, seizures, numbness, or tingling. Denies depression, anxiety or insomnia. Denies skin break down or rash.   PE  BP 130/70   Pulse 78   Resp 14   Ht _0  (1.905 m)   Wt 214 lb (97.1 kg)   SpO2 91% Comment: 4l oxygen  BMI 26.75 kg/m   Patient alert and oriented and in no cardiopulmonary distress.  HEENT: No facial asymmetry, EOMI,   oropharynx pink and moist.  Neck supple no JVD, no mass.  Chest: Clear to auscultation bilaterally.decreased air entry throughout  CVS: S1, S2 no murmurs, no S3.Regular rate.  ABD: Soft non tender.   Ext: No edema  MS: Adequate ROM spine, shoulders, hips and knees.  Skin: Intact, no ulcerations or rash noted.  Psych: Good eye contact, normal affect. Memory intact not anxious or depressed  appearing.  CNS: CN 2-12 intact, power,  normal throughout.no focal deficits noted.   Assessment & Plan  Essential hypertension Controlled, no change in medication DASH diet and commitment to daily physical activity for a minimum of 30 minutes discussed and encouraged, as a part of hypertension management. The importance of attaining a healthy weight is also discussed.  BP/Weight 10/14/2017 07/16/2017 07/02/2017 06/03/2017 05/20/2017 11/17/7937 0/01/91  Systolic BP 330 076 226 333 545 625 638  Diastolic BP 70 76 80 72 86 87 82  Wt. (Lbs) 214 207.25 217 - - 218 215.12  BMI 26.75 25.9 27.12 - - 27.25 26.89       Pulmonary hypertension ([place]) Managed by pulmonary and reports stability and that he is doing well  Hyperlipidemia with target LDL less than 100 Hyperlipidemia:Low fat diet discussed and encouraged.   Lipid Panel  Lab Results  Component Value Date   CHOL 150 06/30/2017   HDL 33 (L) 06/30/2017   LDLCALC 108 (H) 06/30/2017   TRIG 46 06/30/2017   CHOLHDL 4.5 06/30/2017   Uncontrolled, needs to lower fatty food intake Updated lab needed at/ before next visit.     Diabetes mellitus, insulin dependent (IDDM), controlled ([place]) Deteriorated due to medical non compliance  Needs to take insulin and stil has not started this [name] is reminded of the importance of commitment to daily physical activity for 30 minutes or more, as able and the need to limit carbohydrate intake to 30 to 60 grams per meal to help with blood sugar control.   The  need to take medication as prescribed, test blood sugar as directed, and to call between visits if there is a concern that blood sugar is uncontrolled is also discussed.   [name] is reminded of the importance of daily foot exam, annual eye examination, and good blood sugar, blood pressure and cholesterol control.  Diabetic Labs Latest Ref Rng & Units 10/10/2017 06/30/2017 03/14/2017 12/05/2016 05/17/2016  HbA1c <5.7 % of total Hgb  8.2(H) 7.9(H) 7.8(H) 7.7(H) 7.6(H)  Microalbumin Not estab mg/dL - - - 5.5 -  Micro/Creat Ratio <30 mcg/mg creat - - - 45(H) -  Chol <200 mg/dL - 150 - 139 -  HDL >40 mg/dL - 33(L) - 32(L) -  Calc LDL <100 mg/dL - 108(H) - 98 -  Triglycerides <150 mg/dL - 46 - 44 -  Creatinine 0.70 - 1.18 mg/dL 1.19(H) 1.08 1.26(H) 1.18 1.29(H)   BP/Weight 10/14/2017 07/16/2017 07/02/2017 06/03/2017 05/20/2017 12/17/2033 03/20/7415  Systolic BP 384 536 468 032 122 482 500  Diastolic BP 70 76 80 72 86 87 82  Wt. (Lbs) 214 207.25 217 - - 218 215.12  BMI 26.75 25.9 27.12 - - 27.25 26.89   Foot/eye exam completion dates Latest Ref Rng & Units 07/02/2017 12/10/2016  Eye Exam No Retinopathy - -  Foot Form Completion - Done Done      Updated lab needed at/ before next visit.

## 2017-10-14 NOTE — Patient Instructions (Addendum)
Wellness with MD in early April,call if you need me before.  Please take the insulin as promnised , start at 7 unitss daily, you MAY need 10  No other medication changes, blood sugar is high  Fasting lipid, cmp and EGFR , HBA1C, PSA and microalb 1 week before visit  Please work on good  health habits so that your health will improve. 1. Commitment to daily physical activity for 30 to 60  minutes, if you are able to do this.  2. Commitment to wise food choices. Aim for half of your  food intake to be vegetable and fruit, one quarter starchy foods, and one quarter protein. Try to eat on a regular schedule  3 meals per day, snacking between meals should be limited to vegetables or fruits or small portions of nuts. 64 ounces of water per day is generally recommended, unless you have specific health conditions, like heart failure or kidney failure where you will need to limit fluid intake.  3. Commitment to sufficient and a  good quality of physical and mental rest daily, generally between 6 to 8 hours per day.  WITH PERSISTANCE AND PERSEVERANCE, THE IMPOSSIBLE , BECOMES THE NORM! It is important that you exercise regularly at least 30 minutes 5 times a week. If you develop chest pain, have severe difficulty breathing, or feel very tired, stop exercising immediately and seek medical attention

## 2017-10-14 NOTE — Assessment & Plan Note (Signed)
Deteriorated due to medical non compliance  Needs to take insulin and stil has not started this [name] is reminded of the importance of commitment to daily physical activity for 30 minutes or more, as able and the need to limit carbohydrate intake to 30 to 60 grams per meal to help with blood sugar control.   The need to take medication as prescribed, test blood sugar as directed, and to call between visits if there is a concern that blood sugar is uncontrolled is also discussed.   [name] is reminded of the importance of daily foot exam, annual eye examination, and good blood sugar, blood pressure and cholesterol control.  Diabetic Labs Latest Ref Rng & Units 10/10/2017 06/30/2017 03/14/2017 12/05/2016 05/17/2016  HbA1c <5.7 % of total Hgb 8.2(H) 7.9(H) 7.8(H) 7.7(H) 7.6(H)  Microalbumin Not estab mg/dL - - - 5.5 -  Micro/Creat Ratio <30 mcg/mg creat - - - 45(H) -  Chol <200 mg/dL - 150 - 139 -  HDL >40 mg/dL - 33(L) - 32(L) -  Calc LDL <100 mg/dL - 108(H) - 98 -  Triglycerides <150 mg/dL - 46 - 44 -  Creatinine 0.70 - 1.18 mg/dL 1.19(H) 1.08 1.26(H) 1.18 1.29(H)   BP/Weight 10/14/2017 07/16/2017 07/02/2017 06/03/2017 05/20/2017 12/16/8525 05/18/2422  Systolic BP 536 144 315 400 867 619 509  Diastolic BP 70 76 80 72 86 87 82  Wt. (Lbs) 214 207.25 217 - - 218 215.12  BMI 26.75 25.9 27.12 - - 27.25 26.89   Foot/eye exam completion dates Latest Ref Rng & Units 07/02/2017 12/10/2016  Eye Exam No Retinopathy - -  Foot Form Completion - Done Done      Updated lab needed at/ before next visit.

## 2017-10-14 NOTE — Assessment & Plan Note (Signed)
Managed by pulmonary and reports stability and that he is doing well

## 2017-10-14 NOTE — Assessment & Plan Note (Signed)
Hyperlipidemia:Low fat diet discussed and encouraged.   Lipid Panel  Lab Results  Component Value Date   CHOL 150 06/30/2017   HDL 33 (L) 06/30/2017   LDLCALC 108 (H) 06/30/2017   TRIG 46 06/30/2017   CHOLHDL 4.5 06/30/2017   Uncontrolled, needs to lower fatty food intake Updated lab needed at/ before next visit.

## 2017-12-30 ENCOUNTER — Other Ambulatory Visit: Payer: Self-pay | Admitting: Family Medicine

## 2018-01-13 ENCOUNTER — Encounter: Payer: Self-pay | Admitting: Internal Medicine

## 2018-02-10 ENCOUNTER — Ambulatory Visit: Payer: Medicare Other | Admitting: Family Medicine

## 2018-03-05 ENCOUNTER — Ambulatory Visit (INDEPENDENT_AMBULATORY_CARE_PROVIDER_SITE_OTHER): Payer: Medicare Other | Admitting: Family Medicine

## 2018-03-05 ENCOUNTER — Telehealth: Payer: Self-pay | Admitting: Family Medicine

## 2018-03-05 ENCOUNTER — Encounter: Payer: Self-pay | Admitting: Family Medicine

## 2018-03-05 VITALS — BP 122/54 | HR 90 | Temp 97.7°F | Resp 18 | Ht 75.0 in | Wt 218.0 lb

## 2018-03-05 DIAGNOSIS — Z0000 Encounter for general adult medical examination without abnormal findings: Secondary | ICD-10-CM

## 2018-03-05 NOTE — Telephone Encounter (Signed)
When [name] came in today he should have seen Dr Moshe Cipro.  He saw the nurse as a AWV.  After he left I was made aware of the error.  I called him and explained what had happened and he is coming back on 5/6 to see Dr Moshe Cipro.  I mailed him a Immunologist and a card today.  He was really nice and seemed to understand.

## 2018-03-05 NOTE — Progress Notes (Signed)
Subjective:   [name] is a 72 y.o. male who presents for Medicare Annual/Subsequent preventive examination.  Review of Systems:      Objective:    Vitals: BP (!) 122/54 (BP Location: Left Arm, Patient Position: Sitting, Cuff Size: Normal)   Pulse 90   Temp 97.7 F (36.5 C) (Oral)   Resp 18   Ht _0  (1.905 m)   Wt 218 lb (98.9 kg)   SpO2 90% Comment: on oxygen 3lpm  BMI 27.25 kg/m   Body mass index is 27.25 kg/m.  Advanced Directives 03/05/2018 06/03/2017 05/15/2017 08/07/2015 05/31/2015 03/06/2015 12/06/2014  Does Patient Have a Medical Advance Directive? Yes No No Yes No No No  Type of Advance Directive Out of facility DNR (pink MOST or yellow form) - - - - - -  Would patient like information on creating a medical advance directive? - No - Patient declined No - Patient declined - No - patient declined information No - patient declined information No - patient declined information    Tobacco Social History   Tobacco Use  Smoking Status Former Smoker  . Packs/day: 0.50  . Start date: 05/04/1964  . Last attempt to quit: 02/09/2013  . Years since quitting: 5.0  Smokeless Tobacco Never Used     Counseling given: Yes   Clinical Intake:  Pre-visit preparation completed: Yes  Pain : No/denies pain     BMI - recorded: 27.3 Nutritional Status: BMI 25 -29 Overweight Diabetes: Yes CBG done?: No Did pt. bring in CBG monitor from home?: No  How often do you need to have someone help you when you read instructions, pamphlets, or other written materials from your doctor or pharmacy?: 1 - Never What is the last grade level you completed in school?: [employer]  Interpreter Needed?: No  Information entered by :: Vilinda Blanks  Past Medical History:  Diagnosis Date  . Asthma   . Carotid bruit   . COPD (chronic obstructive pulmonary disease) ([place])   . Diabetes mellitus without complication ([place])   . DOE (dyspnea on exertion)    2016  . ED (erectile  dysfunction)   . History of renal calculi   . Hyperlipidemia   . Hypertension   . Hypoxia    2016  . PVD (peripheral vascular disease) ([place])    Past Surgical History:  Procedure Laterality Date  . CARDIAC CATHETERIZATION N/A 05/31/2015   Procedure: Right Heart Cath;  Surgeon: Sherren Mocha, MD;  Location: [facility];  Service: Cardiovascular;  Laterality: N/A;  . CATARACT EXTRACTION W/PHACO Right 05/20/2017   Procedure: CATARACT EXTRACTION PHACO AND INTRAOCULAR LENS PLACEMENT (IOC);  Surgeon: Rutherford Guys, MD;  Location: [facility];  Service: Ophthalmology;  Laterality: Right;  CDE: 7.20  . CATARACT EXTRACTION W/PHACO Left 06/03/2017   Procedure: CATARACT EXTRACTION PHACO AND INTRAOCULAR LENS PLACEMENT (IOC);  Surgeon: Rutherford Guys, MD;  Location: [facility];  Service: Ophthalmology;  Laterality: Left;  CDE: 4.89  . 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 tubular adenomas  . COLONOSCOPY N/A 03/06/2015   QHU:TMLYYTKPT coli/multiple rectal and colon polyps, tubular adenomas. Next colonoscopy April 2019.  [name] left wrist    . LITHOTRIPSY  2010  . ROTATOR CUFF REPAIR  1995   right    Family History  Problem Relation Age of Onset  . Diabetes Sister    Social History   Socioeconomic History  . Marital  status: Married    Spouse name: Not on file  . Number of children: Not on file  . Years of education: Not on file  . Highest education level: Not on file  Occupational History  . Not on file  Social Needs  . Financial resource strain: Not hard at all  . Food insecurity:    Worry: Never true    Inability: Never true  . Transportation needs:    Medical: No    Non-medical: No  Tobacco Use  . Smoking status: Former Smoker    Packs/day: 0.50    Start date: 05/04/1964    Last attempt to quit: 02/09/2013    Years since quitting: 5.0  . Smokeless tobacco: Never Used    Substance and Sexual Activity  . Alcohol use: No    Alcohol/week: 0.0 oz  . Drug use: No  . Sexual activity: Not on file  Lifestyle  . Physical activity:    Days per week: 0 days    Minutes per session: 0 min  . Stress: Not at all  Relationships  . Social connections:    Talks on phone: More than three times a week    Gets together: Once a week    Attends religious service: More than 4 times per year    Active member of club or organization: Yes    Attends meetings of clubs or organizations: More than 4 times per year    Relationship status: Married  Other Topics Concern  . Not on file  Social History Narrative  . Not on file    Outpatient Encounter Medications as of 03/05/2018  Medication Sig  . albuterol (PROVENTIL HFA;VENTOLIN HFA) 108 (90 Base) MCG/ACT inhaler Inhale 2 puffs into the lungs as directed.  [name] ambrisentan (LETAIRIS) 5 MG tablet Take 10 mg by mouth daily.   [name] amLODipine (NORVASC) 5 MG tablet Take 1 tablet (5 mg total) by mouth daily.  [name] atorvastatin (LIPITOR) 10 MG tablet TAKE 1 TABLET BY MOUTH EVERY DAY  . azelastine (ASTELIN) 0.1 % nasal spray Place 2 sprays into both nostrils 2 (two) times daily. Use in each nostril as directed  . cetirizine (ZYRTEC) 10 MG tablet Take 10 mg by mouth daily.  . furosemide (LASIX) 20 MG tablet Take 1 tablet (20 mg total) by mouth daily.  . insulin aspart (NOVOLOG) 100 UNIT/ML injection Inject 8-10 Units into the skin 3 (three) times daily as needed.  . Insulin Detemir (LEVEMIR) 100 UNIT/ML Pen Inject 10 Units into the skin daily at 10 pm.  . Tadalafil, PAH, (ADCIRCA) 20 MG TABS Take 2 tablets by mouth daily.  . Treprostinil Diolamine ER 2.5 MG TBCR Inhale 2.5 mg into the lungs 4 (four) times daily.   No facility-administered encounter medications on file as of 03/05/2018.     Activities of Daily Living In your present state of health, do you have any difficulty performing the following activities: 03/05/2018 05/15/2017  Hearing?  N N  Vision? N Y  Difficulty concentrating or making decisions? N N  Walking or climbing stairs? Y Y  Comment - SOB  Dressing or bathing? N N  Doing errands, shopping? N N  Preparing Food and eating ? N -  Using the Toilet? N -  In the past six months, have you accidently leaked urine? N -  Do you have problems with loss of bowel control? N -  Managing your Medications? N -  Managing your Finances? N -  Housekeeping or managing your  Housekeeping? N -  Some recent data might be hidden    Patient Care Team: Fayrene Helper, MD as PCP - General Nida, Marella Chimes, MD as Consulting Physician (Endocrinology) Gala Romney Cristopher Estimable, MD as Consulting Physician (Gastroenterology) Test, Lazaro Arms, MD (Pulmonary Disease) Delfino Lovett, MD as Referring Physician (Pulmonary Disease) Erby Pian, MD as Referring Physician (Specialist)   Assessment:   This is a routine wellness examination for [employer]  Exercise Activities and Dietary recommendations Current Exercise Habits: The patient does not participate in regular exercise at present, Exercise limited by: respiratory conditions(s)  Goals    None      Fall Risk Fall Risk  03/05/2018 03/05/2018 07/16/2017 03/18/2017 12/10/2016  Falls in the past year? _0   Number falls in past yr: - - - - -  Comment - - - - -   Is the patient's home free of loose throw rugs in walkways, pet beds, electrical cords, etc?   yes      Grab bars in the bathroom? no      Handrails on the stairs?   yes      Adequate lighting?   yes  Depression Screen PHQ 2/9 Scores 03/05/2018 03/05/2018 07/16/2017 03/18/2017  PHQ - 2 Score 0 0 0 0  PHQ- 9 Score - - - -    Cognitive Function     6CIT Screen 03/05/2018  What Year? 0 points  What month? 0 points  What time? 0 points  Count back from 20 0 points  Months in reverse 0 points  Repeat phrase 0 points  Total Score 0    Immunization History  Administered Date(s) Administered  . Pneumococcal  Conjugate-13 06/14/2014  . Pneumococcal Polysaccharide-23 03/01/2008, 03/24/2013  . Tdap 07/02/2017  . Zoster 06/06/2008   Screening Tests Health Maintenance  Topic Date Due  . OPHTHALMOLOGY EXAM  08/14/2016  . URINE MICROALBUMIN  12/05/2017  . INFLUENZA VACCINE  06/16/2018 (Originally 06/11/2018)  . HEMOGLOBIN A1C  04/09/2018  . FOOT EXAM  07/05/2018  . COLONOSCOPY  03/05/2025  . TETANUS/TDAP  07/03/2027  . Hepatitis C Screening  Completed  . PNA vac Low Risk Adult  Completed   Cancer Screenings: Lung: Low Dose CT Chest recommended if Age 37-80 years, 30 pack-year currently smoking OR have quit w/in 15years. Patient does not qualify.     Plan:   I have personally reviewed and noted the following in the patient's chart:   . Medical and social history . Use of alcohol, tobacco or illicit drugs  . Current medications and supplements . Functional ability and status . Nutritional status . Physical activity . Advanced directives . List of other physicians . Hospitalizations, surgeries, and ER visits in previous 12 months . Vitals . Screenings to include cognitive, depression, and falls . Referrals and appointments  In addition, I have reviewed and discussed with patient certain preventive protocols, quality metrics, and best practice recommendations. A written personalized care plan for preventive services as well as general preventive health recommendations were provided to patient.     Tod Persia, [place]  03/05/2018

## 2018-03-05 NOTE — Patient Instructions (Addendum)
[name] , Thank you for taking time to come for your Medicare Wellness Visit. I appreciate your ongoing commitment to your health goals. Please review the following plan we discussed and let me know if I can assist you in the future.   Screening recommendations/referrals: Colonoscopy:UTD  Recommended yearly ophthalmology/optometry visit for glaucoma screening and checkup Recommended yearly dental visit for hygiene and checkup  Vaccinations: Influenza vaccine:Due Fall 2019 Pneumococcal vaccine:UTD Tdap vaccine:UTD Advanced directives: We discussed  Next appointment: Needs to be scheduled with fasting labs 1 week prior to appointment  Preventive Care 9 Years and Older, Male Preventive care refers to lifestyle choices and visits with your health care provider that can promote health and wellness. What does preventive care include?  A yearly physical exam. This is also called an annual well check.  Dental exams once or twice a year.  Routine eye exams. Ask your health care provider how often you should have your eyes checked.  Personal lifestyle choices, including:  Daily care of your teeth and gums.  Regular physical activity.  Eating a healthy diet.  Avoiding tobacco and drug use.  Limiting alcohol use.  Practicing safe sex.  Taking low doses of aspirin every day.  Taking vitamin and mineral supplements as recommended by your health care provider. What happens during an annual well check? The services and screenings done by your health care provider during your annual well check will depend on your age, overall health, lifestyle risk factors, and family history of disease. Counseling  Your health care provider may ask you questions about your:  Alcohol use.  Tobacco use.  Drug use.  Emotional well-being.  Home and relationship well-being.  Sexual activity.  Eating habits.  History of falls.  Memory and ability to understand (cognition).  Work and  work Statistician. Screening  You may have the following tests or measurements:  Height, weight, and BMI.  Blood pressure.  Lipid and cholesterol levels. These may be checked every 5 years, or more frequently if you are over 73 years old.  Skin check.  Lung cancer screening. You may have this screening every year starting at age 49 if you have a 30-pack-year history of smoking and currently smoke or have quit within the past 15 years.  Fecal occult blood test (FOBT) of the stool. You may have this test every year starting at age 44.  Flexible sigmoidoscopy or colonoscopy. You may have a sigmoidoscopy every 5 years or a colonoscopy every 10 years starting at age 30.  Prostate cancer screening. Recommendations will vary depending on your family history and other risks.  Hepatitis C blood test.  Hepatitis B blood test.  Sexually transmitted disease (STD) testing.  Diabetes screening. This is done by checking your blood sugar (glucose) after you have not eaten for a while (fasting). You may have this done every 1-3 years.  Abdominal aortic aneurysm (AAA) screening. You may need this if you are a current or former smoker.  Osteoporosis. You may be screened starting at age 26 if you are at high risk. Talk with your health care provider about your test results, treatment options, and if necessary, the need for more tests. Vaccines  Your health care provider may recommend certain vaccines, such as:  Influenza vaccine. This is recommended every year.  Tetanus, diphtheria, and acellular pertussis (Tdap, Td) vaccine. You may need a Td booster every 10 years.  Zoster vaccine. You may need this after age 31.  Pneumococcal 13-valent conjugate (PCV13) vaccine. One  dose is recommended after age 66.  Pneumococcal polysaccharide (PPSV23) vaccine. One dose is recommended after age 65. Talk to your health care provider about which screenings and vaccines you need and how often you need  them. This information is not intended to replace advice given to you by your health care provider. Make sure you discuss any questions you have with your health care provider. Document Released: 11/24/2015 Document Revised: 07/17/2016 Document Reviewed: 08/29/2015 Elsevier Interactive Patient Education  2017 [employer] Prevention in the Home Falls can cause injuries. They can happen to people of all ages. There are many things you can do to make your home safe and to help prevent falls. What can I do on the outside of my home?  Regularly fix the edges of walkways and driveways and fix any cracks.  Remove anything that might make you trip as you walk through a door, such as a raised step or threshold.  Trim any bushes or trees on the path to your home.  Use bright outdoor lighting.  Clear any walking paths of anything that might make someone trip, such as rocks or tools.  Regularly check to see if handrails are loose or broken. Make sure that both sides of any steps have handrails.  Any raised decks and porches should have guardrails on the edges.  Have any leaves, snow, or ice cleared regularly.  Use sand or salt on walking paths during winter.  Clean up any spills in your garage right away. This includes oil or grease spills. What can I do in the bathroom?  Use night lights.  Install grab bars by the toilet and in the tub and shower. Do not use towel bars as grab bars.  Use non-skid mats or decals in the tub or shower.  If you need to sit down in the shower, use a plastic, non-slip stool.  Keep the floor dry. Clean up any water that spills on the floor as soon as it happens.  Remove soap buildup in the tub or shower regularly.  Attach bath mats securely with double-sided non-slip rug tape.  Do not have throw rugs and other things on the floor that can make you trip. What can I do in the bedroom?  Use night lights.  Make sure that you have a light by your  bed that is easy to reach.  Do not use any sheets or blankets that are too big for your bed. They should not hang down onto the floor.  Have a firm chair that has side arms. You can use this for support while you get dressed.  Do not have throw rugs and other things on the floor that can make you trip. What can I do in the kitchen?  Clean up any spills right away.  Avoid walking on wet floors.  Keep items that you use a lot in easy-to-reach places.  If you need to reach something above you, use a strong step stool that has a grab bar.  Keep electrical cords out of the way.  Do not use floor polish or wax that makes floors slippery. If you must use wax, use non-skid floor wax.  Do not have throw rugs and other things on the floor that can make you trip. What can I do with my stairs?  Do not leave any items on the stairs.  Make sure that there are handrails on both sides of the stairs and use them. Fix handrails that are broken or  loose. Make sure that handrails are as long as the stairways.  Check any carpeting to make sure that it is firmly attached to the stairs. Fix any carpet that is loose or worn.  Avoid having throw rugs at the top or bottom of the stairs. If you do have throw rugs, attach them to the floor with carpet tape.  Make sure that you have a light switch at the top of the stairs and the bottom of the stairs. If you do not have them, ask someone to add them for you. What else can I do to help prevent falls?  Wear shoes that:  Do not have high heels.  Have rubber bottoms.  Are comfortable and fit you well.  Are closed at the toe. Do not wear sandals.  If you use a stepladder:  Make sure that it is fully opened. Do not climb a closed stepladder.  Make sure that both sides of the stepladder are locked into place.  Ask someone to hold it for you, if possible.  Clearly mark and make sure that you can see:  Any grab bars or handrails.  First and last  steps.  Where the edge of each step is.  Use tools that help you move around (mobility aids) if they are needed. These include:  Canes.  Walkers.  Scooters.  Crutches.  Turn on the lights when you go into a dark area. Replace any light bulbs as soon as they burn out.  Set up your furniture so you have a clear path. Avoid moving your furniture around.  If any of your floors are uneven, fix them.  If there are any pets around you, be aware of where they are.  Review your medicines with your doctor. Some medicines can make you feel dizzy. This can increase your chance of falling. Ask your doctor what other things that you can do to help prevent falls. This information is not intended to replace advice given to you by your health care provider. Make sure you discuss any questions you have with your health care provider. Document Released: 08/24/2009 Document Revised: 04/04/2016 Document Reviewed: 12/02/2014 Elsevier Interactive Patient Education  2017 [employer].

## 2018-03-08 NOTE — Progress Notes (Signed)
Patient was not seen by the MD as he should have been on the day of this visit hence this visit is cancelled and there will be no billing

## 2018-03-13 LAB — LIPID PANEL
Cholesterol: 130 mg/dL (ref <200)
HDL: 34 mg/dL — ABNORMAL LOW (ref >40)
LDL CHOLESTEROL (CALC): 80 mg/dL
NON-HDL CHOLESTEROL (CALC): 96 mg/dL (ref <130)
Total CHOL/HDL Ratio: 3.8 (calc) (ref <5.0)
Triglycerides: 76 mg/dL (ref <150)

## 2018-03-13 LAB — COMPLETE METABOLIC PANEL WITH GFR
AG Ratio: 1.4 (calc) (ref 1.0–2.5)
ALBUMIN MSPROF: 4.2 g/dL (ref 3.6–5.1)
ALKALINE PHOSPHATASE (APISO): 61 U/L (ref 40–115)
ALT: 8 U/L — AB (ref 9–46)
AST: 17 U/L (ref 10–35)
BILIRUBIN TOTAL: 0.9 mg/dL (ref 0.2–1.2)
BUN / CREAT RATIO: 17 (calc) (ref 6–22)
BUN: 22 mg/dL (ref 7–25)
CHLORIDE: 106 mmol/L (ref 98–110)
CO2: 25 mmol/L (ref 20–32)
CREATININE: 1.26 mg/dL — AB (ref 0.70–1.18)
Calcium: 9.4 mg/dL (ref 8.6–10.3)
GFR, Est African American: 66 mL/min/{1.73_m2} (ref >=60)
GFR, Est Non African American: 57 mL/min/{1.73_m2} — ABNORMAL LOW (ref >=60)
GLUCOSE: 137 mg/dL — AB (ref 65–99)
Globulin: 3 g/dL (calc) (ref 1.9–3.7)
Potassium: 4.2 mmol/L (ref 3.5–5.3)
Sodium: 140 mmol/L (ref 135–146)
Total Protein: 7.2 g/dL (ref 6.1–8.1)

## 2018-03-13 LAB — PSA: PSA: 0.7 ng/mL (ref <=4.0)

## 2018-03-14 LAB — MICROALBUMIN / CREATININE URINE RATIO
Creatinine, Urine: 112 mg/dL (ref 20–320)
MICROALB UR: 5.6 mg/dL
Microalb Creat Ratio: 50 mcg/mg creat — ABNORMAL HIGH (ref <30)

## 2018-03-14 LAB — HEMOGLOBIN A1C
EAG (MMOL/L): 9.8 (calc)
HEMOGLOBIN A1C: 7.8 %{Hb} — AB (ref <5.7)
MEAN PLASMA GLUCOSE: 177 (calc)

## 2018-03-18 ENCOUNTER — Ambulatory Visit (INDEPENDENT_AMBULATORY_CARE_PROVIDER_SITE_OTHER): Payer: Medicare Other | Admitting: Family Medicine

## 2018-03-18 ENCOUNTER — Encounter: Payer: Self-pay | Admitting: Family Medicine

## 2018-03-18 VITALS — BP 138/72 | HR 82 | Resp 17 | Ht 75.0 in | Wt 219.0 lb

## 2018-03-18 DIAGNOSIS — E119 Type 2 diabetes mellitus without complications: Secondary | ICD-10-CM

## 2018-03-18 DIAGNOSIS — Z794 Long term (current) use of insulin: Secondary | ICD-10-CM

## 2018-03-18 DIAGNOSIS — Z0000 Encounter for general adult medical examination without abnormal findings: Secondary | ICD-10-CM | POA: Diagnosis not present

## 2018-03-18 DIAGNOSIS — IMO0001 Reserved for inherently not codable concepts without codable children: Secondary | ICD-10-CM

## 2018-03-18 DIAGNOSIS — L84 Corns and callosities: Secondary | ICD-10-CM | POA: Insufficient documentation

## 2018-03-18 NOTE — Progress Notes (Signed)
Preventive Screening-Counseling & Management   Patient present here today for a Medicare annual wellness visit.   Current Problems (verified)   Medications Prior to Visit Allergies (verified)   PAST HISTORY  Family History (verified)   Social History - married for 80 years, father of 78, retired from [employer]    Risk Factors  Current exercise habits:  Unable to exercise currently due to SOB and he uses oxygen   Dietary issues discussed: heart healthy    Cardiac risk factors: dm, hypertension, heart disease  Depression Screen  (Note: if answer to either of the following is "Yes", a more complete depression screening is indicated)   Over the past two weeks, have you felt down, depressed or hopeless? No  Over the past two weeks, have you felt little interest or pleasure in doing things? No  Have you lost interest or pleasure in daily life? No  Do you often feel hopeless? No  Do you cry easily over simple problems? No   Activities of Daily Living  In your present state of health, do you have any difficulty performing the following activities?  Driving?: No Managing money?: No Feeding yourself?:No Getting from bed to chair?:No Climbing a flight of stairs? Yes, due to pulmonary hypertension and hypoxia   Preparing food and eating?:No Bathing or showering?:No Getting dressed?:No Getting to the toilet?:No Using the toilet?:No Moving around from place to place?: has to take his time and is on long term oxygen therapy but he still is able to get around  Fall Risk Assessment In the past year have you fallen or had a near fall?:No Are you currently taking any medications that make you dizzy:No   Hearing Difficulties: No Do you often ask people to speak up or repeat themselves?:No Do you experience ringing or noises in your ears?:No Do you have difficulty understanding soft or whispered voices?:No  Cognitive Testing  Alert? Yes Normal Appearance?Yes  Oriented to person?  Yes Place? Yes  Time? Yes  Displays appropriate judgment?Yes  Can read the correct time from a watch face? yes Are you having problems remembering things?No  Advanced Directives have been discussed with the patient?Yes    List the Names of Other Physician/Practitioners you currently use: (updated in care team)-    Indicate any recent Medical Services you may have received from other than [facility] providers in the past year (date may be approximate).     Medicare Attestation  I have personally reviewed:  The patient's medical and social history  Their use of alcohol, tobacco or illicit drugs  Their current medications and supplements  The patient's functional ability including ADLs,fall risks, home safety risks, cognitive, and hearing and visual impairment  Diet and physical activities  Evidence for depression or mood disorders  The patient's weight, height, BMI, and visual acuity have been recorded in the chart. I have made referrals, counseling, and provided education to the patient based on review of the above and I have provided the patient with a written personalized care plan for preventive services.    Physical Exam BP 140/80   Pulse 82   Resp 17   Ht _0  (1.905 m)   Wt 219 lb (99.3 kg)   SpO2 90% Comment: with 3 L/O2  BMI 27.37 kg/m   Foot: right; Necrotic appearing  ulcer on sole of right foot proximal to great toe maximal diameter approximately 4 cm, no drainage,heavy burden of  callus, no erythema, poor circulation Assessment & Plan:  Callus of foot  1 month h/o right foot pain and soreness with pressure pt is diabetic , has had the callus for year as, needs referral to podiatry asap, no purulent drainage, no fever or chills, very painful with pressure  Medicare annual wellness visit, subsequent Annual exam as documented. Counseling done  re healthy lifestyle involving commitment to 150 minutes exercise per week, heart healthy diet, and attaining healthy weight.The  importance of adequate sleep also discussed. Regular seat belt use and home safety, is also discussed. Changes in health habits are decided on by the patient with goals and time frames  set for achieving them. Immunization and cancer screening needs are specifically addressed at this visit.

## 2018-03-18 NOTE — Assessment & Plan Note (Signed)

## 2018-03-18 NOTE — Patient Instructions (Addendum)
Physical exam in 4.5 months, cancel sooner  We will contact you with  appontment , labs and podiatry exam and  Also living will info will be given

## 2018-03-18 NOTE — Assessment & Plan Note (Addendum)
1 month h/o right foot pain and soreness with pressure pt is diabetic , has had the callus for year as, needs referral to podiatry asap, no purulent drainage, no fever or chills, very painful with pressure

## 2018-04-04 ENCOUNTER — Other Ambulatory Visit: Payer: Self-pay | Admitting: Family Medicine

## 2018-04-22 ENCOUNTER — Ambulatory Visit: Payer: Medicare Other | Admitting: Family Medicine

## 2018-06-02 ENCOUNTER — Other Ambulatory Visit: Payer: Self-pay | Admitting: Family Medicine

## 2018-07-24 ENCOUNTER — Telehealth: Payer: Self-pay

## 2018-07-24 DIAGNOSIS — E785 Hyperlipidemia, unspecified: Secondary | ICD-10-CM

## 2018-07-24 DIAGNOSIS — Z794 Long term (current) use of insulin: Secondary | ICD-10-CM

## 2018-07-24 DIAGNOSIS — I10 Essential (primary) hypertension: Secondary | ICD-10-CM

## 2018-07-24 DIAGNOSIS — E119 Type 2 diabetes mellitus without complications: Secondary | ICD-10-CM

## 2018-07-24 DIAGNOSIS — E559 Vitamin D deficiency, unspecified: Secondary | ICD-10-CM

## 2018-07-24 DIAGNOSIS — IMO0001 Reserved for inherently not codable concepts without codable children: Secondary | ICD-10-CM

## 2018-07-24 NOTE — Telephone Encounter (Signed)
Labs ordered

## 2018-07-25 LAB — COMPLETE METABOLIC PANEL WITH GFR
AG RATIO: 1.3 (calc) (ref 1.0–2.5)
ALT: 10 U/L (ref 9–46)
AST: 18 U/L (ref 10–35)
Albumin: 4.2 g/dL (ref 3.6–5.1)
Alkaline phosphatase (APISO): 61 U/L (ref 40–115)
BILIRUBIN TOTAL: 1.3 mg/dL — AB (ref 0.2–1.2)
BUN/Creatinine Ratio: 15 (calc) (ref 6–22)
BUN: 18 mg/dL (ref 7–25)
CHLORIDE: 109 mmol/L (ref 98–110)
CO2: 23 mmol/L (ref 20–32)
Calcium: 9.3 mg/dL (ref 8.6–10.3)
Creat: 1.19 mg/dL — ABNORMAL HIGH (ref 0.70–1.18)
GFR, Est African American: 70 mL/min/{1.73_m2} (ref >=60)
GFR, Est Non African American: 60 mL/min/{1.73_m2} (ref >=60)
Globulin: 3.2 g/dL (calc) (ref 1.9–3.7)
Glucose, Bld: 125 mg/dL — ABNORMAL HIGH (ref 65–99)
POTASSIUM: 4.1 mmol/L (ref 3.5–5.3)
Sodium: 143 mmol/L (ref 135–146)
Total Protein: 7.4 g/dL (ref 6.1–8.1)

## 2018-07-25 LAB — VITAMIN D 25 HYDROXY (VIT D DEFICIENCY, FRACTURES): VIT D 25 HYDROXY: 26 ng/mL — AB (ref 30–100)

## 2018-07-25 LAB — HEMOGLOBIN A1C
EAG (MMOL/L): 10 (calc)
Hgb A1c MFr Bld: 7.9 % of total Hgb — ABNORMAL HIGH (ref <5.7)
Mean Plasma Glucose: 180 (calc)

## 2018-07-25 LAB — CBC
HCT: 48.3 % (ref 38.5–50.0)
HEMOGLOBIN: 16.1 g/dL (ref 13.2–17.1)
MCH: 28.2 pg (ref 27.0–33.0)
MCHC: 33.3 g/dL (ref 32.0–36.0)
MCV: 84.6 fL (ref 80.0–100.0)
MPV: 10.8 fL (ref 7.5–12.5)
Platelets: 108 10*3/uL — ABNORMAL LOW (ref 140–400)
RBC: 5.71 10*6/uL (ref 4.20–5.80)
RDW: 15.9 % — ABNORMAL HIGH (ref 11.0–15.0)
WBC: 4.6 10*3/uL (ref 3.8–10.8)

## 2018-07-25 LAB — LIPID PANEL
Cholesterol: 123 mg/dL (ref <200)
HDL: 41 mg/dL (ref >40)
LDL Cholesterol (Calc): 73 mg/dL (calc)
NON-HDL CHOLESTEROL (CALC): 82 mg/dL (ref <130)
TRIGLYCERIDES: 33 mg/dL (ref <150)
Total CHOL/HDL Ratio: 3 (calc) (ref <5.0)

## 2018-07-25 LAB — TSH: TSH: 4.27 m[IU]/L (ref 0.40–4.50)

## 2018-07-28 ENCOUNTER — Ambulatory Visit (INDEPENDENT_AMBULATORY_CARE_PROVIDER_SITE_OTHER): Payer: Medicare Other | Admitting: Family Medicine

## 2018-07-28 ENCOUNTER — Encounter: Payer: Self-pay | Admitting: Family Medicine

## 2018-07-28 VITALS — BP 122/78 | HR 78 | Resp 17 | Ht 75.0 in | Wt 211.0 lb

## 2018-07-28 DIAGNOSIS — E785 Hyperlipidemia, unspecified: Secondary | ICD-10-CM

## 2018-07-28 DIAGNOSIS — I10 Essential (primary) hypertension: Secondary | ICD-10-CM

## 2018-07-28 DIAGNOSIS — IMO0001 Reserved for inherently not codable concepts without codable children: Secondary | ICD-10-CM

## 2018-07-28 DIAGNOSIS — E119 Type 2 diabetes mellitus without complications: Secondary | ICD-10-CM

## 2018-07-28 DIAGNOSIS — I272 Pulmonary hypertension, unspecified: Secondary | ICD-10-CM

## 2018-07-28 DIAGNOSIS — Z794 Long term (current) use of insulin: Secondary | ICD-10-CM

## 2018-07-28 DIAGNOSIS — R0902 Hypoxemia: Secondary | ICD-10-CM

## 2018-07-28 MED ORDER — FUROSEMIDE 20 MG PO TABS: 20.0000 mg | ORAL_TABLET | Freq: Every day | ORAL | 3 refills | Status: DC

## 2018-07-28 MED ORDER — POTASSIUM CHLORIDE ER 10 MEQ PO TBCR: EXTENDED_RELEASE_TABLET | ORAL | 5 refills | Status: DC

## 2018-07-28 NOTE — Patient Instructions (Addendum)
F/U in 3 months call if you need me before    Non fast HBA1C , chem7 and EGFr,  3 days before  CONGRATS   On excellent labs  No medication changes

## 2018-07-28 NOTE — Progress Notes (Signed)
[name]     MRN: [id number]      DOB: 1945-12-11   HPI [name] is here for follow up and re-evaluation of chronic medical conditions, medication management and review of any available recent lab and radiology data.  Preventive health is updated, specifically  Cancer screening and Immunization.   He has been recently evaluated both by pulmonary and at the [facility] and states he feels well and is doing well. Takes insulin only 'if needed" managing his blood sugar primarily by diet and reports fairly good numbers most of the time. Denies polyuria, polydipsia, blurred vision , or hypoglycemic episodes. Feels much better now that he started taking the diuretic as prescribed and is much improved  Maintains oxygen at 6 liters most of the time There are no new concerns.  ROS Denies recent fever or chills. Denies sinus pressure, nasal congestion, ear pain or sore throat. Denies chest congestion, productive cough or wheezing.Does c/o shortness of breath with activity and at rest , requiring oxygen continually Denies chest pains, palpitations and leg swelling Denies abdominal pain, nausea, vomiting,diarrhea or constipation.   Denies dysuria, frequency, hesitancy or incontinence. Denies headaches, seizures, numbness, or tingling. Denies depression, anxiety or insomnia. Denies skin break down or rash.   PE  BP 122/78   Pulse 78   Resp 17   Ht _0  (1.905 m)   Wt 211 lb (95.7 kg)   SpO2 (!) 88% Comment: setting 5 on oxygen concentrator  BMI 26.37 kg/m   Patient alert and oriented and in no cardiopulmonary distress.  HEENT: No facial asymmetry, EOMI,   oropharynx pink and moist.  Neck supple no JVD, no mass.  Chest: Clear to auscultation bilaterally.decreased air entry bilaterally  CVS: S1, S2 no murmurs, no S3.Regular rate.  ABD: Soft non tender.   Ext: No edema  MS: Adequate though reduced  ROM spine, shoulders, hips and knees.  Skin: Intact, no ulcerations or rash  noted.  Psych: Good eye contact, normal affect. Memory intact not anxious or depressed appearing.  CNS: CN 2-12 intact, power,  normal throughout.no focal deficits noted.   Assessment & Plan  Essential hypertension Controlled, no change in medication DASH diet and commitment to daily physical activity for a minimum of 30 minutes discussed and encouraged, as a part of hypertension management. The importance of attaining a healthy weight is also discussed.  BP/Weight 07/28/2018 03/18/2018 03/05/2018 10/14/2017 07/16/2017 07/02/2017 6/75/4492  Systolic BP 010 071 219 758 832 549 826  Diastolic BP 78 72 54 70 76 80 72  Wt. (Lbs) 211 219 218 214 207.25 217 -  BMI 26.37 27.37 27.25 26.75 25.9 27.12 -       Hypoxia Continue oxygen as before, management is by Pulmonary  Diabetes mellitus, insulin dependent (IDDM), controlled [name] is reminded of the importance of commitment to daily physical activity for 30 minutes or more, as able and the need to limit carbohydrate intake to 30 to 60 grams per meal to help with blood sugar control.   The need to take medication as prescribed, test blood sugar as directed, and to call between visits if there is a concern that blood sugar is uncontrolled is also discussed.   [name] is reminded of the importance of daily foot exam, annual eye examination, and good blood sugar, blood pressure and cholesterol control.  Diabetic Labs Latest Ref Rng & Units 07/24/2018 03/13/2018 10/10/2017 06/30/2017 03/14/2017  HbA1c <5.7 % of total Hgb 7.9(H) 7.8(H)  8.2(H) 7.9(H) 7.8(H)  Microalbumin mg/dL - 5.6 - - -  Micro/Creat Ratio <30 mcg/mg creat - 50(H) - - -  Chol <200 mg/dL 123 130 - 150 -  HDL >40 mg/dL 41 34(L) - 33(L) -  Calc LDL mg/dL (calc) 73 80 - 108(H) -  Triglycerides <150 mg/dL 33 76 - 46 -  Creatinine 0.70 - 1.18 mg/dL 1.19(H) 1.26(H) 1.19(H) 1.08 1.26(H)   BP/Weight 07/28/2018 03/18/2018 03/05/2018 10/14/2017 07/16/2017 07/02/2017 2/81/1886    Systolic BP 773 736 681 594 707 615 183  Diastolic BP 78 72 54 70 76 80 72  Wt. (Lbs) 211 219 218 214 207.25 217 -  BMI 26.37 27.37 27.25 26.75 25.9 27.12 -   Foot/eye exam completion dates Latest Ref Rng & Units 07/02/2017 12/10/2016  Eye Exam No Retinopathy - -  Foot Form Completion - Done Done        Hyperlipidemia with target LDL less than 100 Hyperlipidemia:Low fat diet discussed and encouraged.   Lipid Panel  Lab Results  Component Value Date   CHOL 123 07/24/2018   HDL 41 07/24/2018   LDLCALC 73 07/24/2018   TRIG 33 07/24/2018   CHOLHDL 3.0 07/24/2018   Controlled, no change in medication     Pulmonary hypertension ([place]) Stable and managed by Pulmonary

## 2018-07-28 NOTE — Assessment & Plan Note (Signed)
[name] is reminded of the importance of commitment to daily physical activity for 30 minutes or more, as able and the need to limit carbohydrate intake to 30 to 60 grams per meal to help with blood sugar control.   The need to take medication as prescribed, test blood sugar as directed, and to call between visits if there is a concern that blood sugar is uncontrolled is also discussed.   [name] is reminded of the importance of daily foot exam, annual eye examination, and good blood sugar, blood pressure and cholesterol control.  Diabetic Labs Latest Ref Rng & Units 07/24/2018 03/13/2018 10/10/2017 06/30/2017 03/14/2017  HbA1c <5.7 % of total Hgb 7.9(H) 7.8(H) 8.2(H) 7.9(H) 7.8(H)  Microalbumin mg/dL - 5.6 - - -  Micro/Creat Ratio <30 mcg/mg creat - 50(H) - - -  Chol <200 mg/dL 123 130 - 150 -  HDL >40 mg/dL 41 34(L) - 33(L) -  Calc LDL mg/dL (calc) 73 80 - 108(H) -  Triglycerides <150 mg/dL 33 76 - 46 -  Creatinine 0.70 - 1.18 mg/dL 1.19(H) 1.26(H) 1.19(H) 1.08 1.26(H)   BP/Weight 07/28/2018 03/18/2018 03/05/2018 10/14/2017 07/16/2017 07/02/2017 07/14/7954  Systolic BP 831 674 255 258 948 347 583  Diastolic BP 78 72 54 70 76 80 72  Wt. (Lbs) 211 219 218 214 207.25 217 -  BMI 26.37 27.37 27.25 26.75 25.9 27.12 -   Foot/eye exam completion dates Latest Ref Rng & Units 07/02/2017 12/10/2016  Eye Exam No Retinopathy - -  Foot Form Completion - Done Done

## 2018-07-28 NOTE — Assessment & Plan Note (Signed)
Controlled, no change in medication DASH diet and commitment to daily physical activity for a minimum of 30 minutes discussed and encouraged, as a part of hypertension management. The importance of attaining a healthy weight is also discussed.  BP/Weight 07/28/2018 03/18/2018 03/05/2018 10/14/2017 07/16/2017 07/02/2017 9/83/3825  Systolic BP 053 976 734 193 790 240 973  Diastolic BP 78 72 54 70 76 80 72  Wt. (Lbs) 211 219 218 214 207.25 217 -  BMI 26.37 27.37 27.25 26.75 25.9 27.12 -

## 2018-07-28 NOTE — Assessment & Plan Note (Signed)
Continue oxygen as before, management is by Pulmonary

## 2018-07-29 ENCOUNTER — Encounter: Payer: Self-pay | Admitting: Family Medicine

## 2018-07-29 NOTE — Assessment & Plan Note (Signed)
Stable and managed by Pulmonary

## 2018-07-29 NOTE — Assessment & Plan Note (Signed)
Hyperlipidemia:Low fat diet discussed and encouraged.   Lipid Panel  Lab Results  Component Value Date   CHOL 123 07/24/2018   HDL 41 07/24/2018   LDLCALC 73 07/24/2018   TRIG 33 07/24/2018   CHOLHDL 3.0 07/24/2018   Controlled, no change in medication

## 2018-10-21 ENCOUNTER — Other Ambulatory Visit: Payer: Self-pay | Admitting: Family Medicine

## 2018-10-21 LAB — COMPLETE METABOLIC PANEL WITH GFR
AG RATIO: 1.1 (calc) (ref 1.0–2.5)
ALBUMIN MSPROF: 3.7 g/dL (ref 3.6–5.1)
ALKALINE PHOSPHATASE (APISO): 76 U/L (ref 40–115)
ALT: 9 U/L (ref 9–46)
AST: 19 U/L (ref 10–35)
BUN / CREAT RATIO: 16 (calc) (ref 6–22)
BUN: 22 mg/dL (ref 7–25)
CHLORIDE: 107 mmol/L (ref 98–110)
CO2: 23 mmol/L (ref 20–32)
Calcium: 9.3 mg/dL (ref 8.6–10.3)
Creat: 1.38 mg/dL — ABNORMAL HIGH (ref 0.70–1.18)
GFR, EST AFRICAN AMERICAN: 58 mL/min/{1.73_m2} — AB (ref >=60)
GFR, Est Non African American: 50 mL/min/{1.73_m2} — ABNORMAL LOW (ref >=60)
GLUCOSE: 126 mg/dL — AB (ref 65–99)
Globulin: 3.4 g/dL (calc) (ref 1.9–3.7)
POTASSIUM: 4.1 mmol/L (ref 3.5–5.3)
SODIUM: 140 mmol/L (ref 135–146)
TOTAL PROTEIN: 7.1 g/dL (ref 6.1–8.1)
Total Bilirubin: 1.4 mg/dL — ABNORMAL HIGH (ref 0.2–1.2)

## 2018-10-21 LAB — HEMOGLOBIN A1C
Hgb A1c MFr Bld: 8.1 % of total Hgb — ABNORMAL HIGH (ref <5.7)
MEAN PLASMA GLUCOSE: 186 (calc)
eAG (mmol/L): 10.3 (calc)

## 2018-10-22 ENCOUNTER — Ambulatory Visit: Payer: Medicare Other | Admitting: Family Medicine

## 2018-11-12 ENCOUNTER — Ambulatory Visit (INDEPENDENT_AMBULATORY_CARE_PROVIDER_SITE_OTHER): Payer: Medicare Other | Admitting: Family Medicine

## 2018-11-12 VITALS — BP 124/68 | HR 101 | Resp 14 | Ht 75.0 in | Wt 237.1 lb

## 2018-11-12 DIAGNOSIS — Z23 Encounter for immunization: Secondary | ICD-10-CM

## 2018-11-12 DIAGNOSIS — R0902 Hypoxemia: Secondary | ICD-10-CM

## 2018-11-12 DIAGNOSIS — R609 Edema, unspecified: Secondary | ICD-10-CM | POA: Diagnosis not present

## 2018-11-12 DIAGNOSIS — IMO0001 Reserved for inherently not codable concepts without codable children: Secondary | ICD-10-CM

## 2018-11-12 DIAGNOSIS — E119 Type 2 diabetes mellitus without complications: Secondary | ICD-10-CM

## 2018-11-12 DIAGNOSIS — Z794 Long term (current) use of insulin: Secondary | ICD-10-CM

## 2018-11-12 DIAGNOSIS — I5033 Acute on chronic diastolic (congestive) heart failure: Secondary | ICD-10-CM | POA: Diagnosis not present

## 2018-11-12 MED ORDER — FUROSEMIDE 20 MG PO TABS: ORAL_TABLET | ORAL | 0 refills | Status: DC

## 2018-11-12 NOTE — Progress Notes (Signed)
[name]     MRN: [id number]      DOB: [birth year]   HPI [name] with a 4 week h/o dry hacking cough worse at night. Notes 4 week h/o leg swelling, poor exercise tolerance, and has gained 27 pound since last visit Denies polyuria, polydipsia, blurred vision , or hypoglycemic episodes. Not taking insulin  ROS Denies recent fever or chills. Denies sinus pressure, nasal congestion, ear pain or sore throat. Denies chest congestion, productive cough or wheezing.  Denies abdominal pain, nausea, vomiting,diarrhea or constipation.   Denies dysuria, frequency, hesitancy or incontinence. Denies uncontrolled  joint pain, c/o  limitation in mobility. Denies headaches, seizures, numbness, or tingling. Denies depression, anxiety or insomnia. Denies skin break down or rash.   PE  BP 124/68 (BP Location: Left Arm, Patient Position: Sitting, Cuff Size: Normal)   Pulse (!) 101   Resp 14   Ht _0  (1.905 m)   Wt 237 lb 1.3 oz (107.5 kg)   SpO2 (!) 88% Comment: with 6lpm oxygen  BMI 29.63 kg/m   Patient alert ill appearing, lips cyanotic , pulse ox is 88% on 6 liters of oxygen HEENT: No facial asymmetry, EOMI,   oropharynx pink and moist.  Neck supple JVD, no mass.  Chest: decreased air entry throughout  CVS: S1, S2 no murmurs, no S3.Regular rate.  ABD: Soft non tender.   Ext: No edema  [place]:OVZCHYIFO  ROM spine, shoulders, hips and knees.  Skin: Intact, no ulcerations or rash noted.  Psych: Good eye contact, normal affect. Memory intact not anxious or depressed appearing.Poor insight and judgemtn at this visit re extent of illness  CNS: CN 2-12 intact, power,  normal throughout.no focal deficits noted.   Assessment & Plan  Acute on chronic diastolic heart failure (HCC) Current decompensation and Ed evaluation recommended repeatedly as hypoxic on 6 liters, and reports increased symptomatology x 4 weeks. Repeatedly refuses. Increase in diuretic with 10 day follow up,  also with directiions to seek Ed eval asap, should he worsen Daily weights and sodium restriction recommended  Diabetes mellitus, insulin dependent (IDDM), controlled [facility]) [name] is reminded of the importance of commitment to daily physical activity for 30 minutes or more, as able and the need to limit carbohydrate intake to 30 to 60 grams per meal to help with blood sugar control.   The need to take medication as prescribed, test blood sugar as directed, and to call between visits if there is a concern that blood sugar is uncontrolled is also discussed.   [name] is reminded of the importance of daily foot exam, annual eye examination, and good blood sugar, blood pressure and cholesterol control. Deteriorated and currently uncontrolled, still reports that he is non compliant with medication Diabetic Labs Latest Ref Rng & Units 10/20/2018 07/24/2018 03/13/2018 10/10/2017 06/30/2017  HbA1c <5.7 % of total Hgb 8.1(H) 7.9(H) 7.8(H) 8.2(H) 7.9(H)  Microalbumin mg/dL - - 5.6 - -  Micro/Creat Ratio <30 mcg/mg creat - - 50(H) - -  Chol <200 mg/dL - 123 130 - 150  HDL >40 mg/dL - 41 34(L) - 33(L)  Calc LDL mg/dL (calc) - 73 80 - 108(H)  Triglycerides <150 mg/dL - 33 76 - 46  Creatinine 0.70 - 1.18 mg/dL 1.38(H) 1.19(H) 1.26(H) 1.19(H) 1.08   BP/Weight 11/12/2018 07/28/2018 03/18/2018 03/05/2018 10/14/2017 07/16/2017 2/77/4128  Systolic BP 786 767 209 470 962 836 629  Diastolic BP 68 78 72 54 70 76 80  Wt. (Lbs) 237.08 211  219 218 214 207.25 217  BMI 29.63 26.37 27.37 27.25 26.75 25.9 27.12   Foot/eye exam completion dates Latest Ref Rng & Units 07/02/2017 12/10/2016  Eye Exam No Retinopathy - -  Foot Form Completion - Done Done        Hypoxia Worsened in past 4 weeks, likely CHF the trigger  Edema Marked edema and weight gain x 4 weeks by history, diuretic dose increased with xlose f/u also needs card re eval  Need for immunization against influenza After obtaining informed consent,  the vaccine is  administered

## 2018-11-12 NOTE — Patient Instructions (Signed)
F/u in 10 days, if you do not improve in the next 2 days as far as urine output, weight and shortness of breath , you need to go to the emergency room, as I do recommend evaluation there  Today  You are treated for acute on chronic heart failure  Flu vaccine today  You are referred to cardiology for soonest available appointment  INCREASE dose of lasx , short term is sen to your pharmacy. You need to taker potassium one three times daily also

## 2018-11-13 ENCOUNTER — Telehealth: Payer: Self-pay | Admitting: Family Medicine

## 2018-11-13 NOTE — Telephone Encounter (Signed)
I called [name] today regarding his appt with Cardio and he asked me to remind you that he needs something for itching.  I told him you will not see this till Monday and it it get to bad to go to urgent care.

## 2018-11-15 ENCOUNTER — Other Ambulatory Visit: Payer: Self-pay | Admitting: Family Medicine

## 2018-11-15 MED ORDER — HYDROXYZINE HCL 10 MG PO TABS: ORAL_TABLET | ORAL | 5 refills | Status: DC

## 2018-11-15 NOTE — Telephone Encounter (Signed)
Medication sent on 01/05/ 2020 for itching, had  To leave a msg, pls verify receipt

## 2018-11-15 NOTE — Progress Notes (Signed)
Hydroxyzine 10

## 2018-11-16 NOTE — Telephone Encounter (Signed)
Patient aware

## 2018-11-17 ENCOUNTER — Encounter: Payer: Self-pay | Admitting: Family Medicine

## 2018-11-17 DIAGNOSIS — Z23 Encounter for immunization: Secondary | ICD-10-CM | POA: Insufficient documentation

## 2018-11-17 NOTE — Assessment & Plan Note (Signed)
[name] is reminded of the importance of commitment to daily physical activity for 30 minutes or more, as able and the need to limit carbohydrate intake to 30 to 60 grams per meal to help with blood sugar control.   The need to take medication as prescribed, test blood sugar as directed, and to call between visits if there is a concern that blood sugar is uncontrolled is also discussed.   [name] is reminded of the importance of daily foot exam, annual eye examination, and good blood sugar, blood pressure and cholesterol control. Deteriorated and currently uncontrolled, still reports that he is non compliant with medication Diabetic Labs Latest Ref Rng & Units 10/20/2018 07/24/2018 03/13/2018 10/10/2017 06/30/2017  HbA1c <5.7 % of total Hgb 8.1(H) 7.9(H) 7.8(H) 8.2(H) 7.9(H)  Microalbumin mg/dL - - 5.6 - -  Micro/Creat Ratio <30 mcg/mg creat - - 50(H) - -  Chol <200 mg/dL - 123 130 - 150  HDL >40 mg/dL - 41 34(L) - 33(L)  Calc LDL mg/dL (calc) - 73 80 - 108(H)  Triglycerides <150 mg/dL - 33 76 - 46  Creatinine 0.70 - 1.18 mg/dL 1.38(H) 1.19(H) 1.26(H) 1.19(H) 1.08   BP/Weight 11/12/2018 07/28/2018 03/18/2018 03/05/2018 10/14/2017 07/16/2017 11/20/3157  Systolic BP 458 592 924 462 863 817 711  Diastolic BP 68 78 72 54 70 76 80  Wt. (Lbs) 237.08 211 219 218 214 207.25 217  BMI 29.63 26.37 27.37 27.25 26.75 25.9 27.12   Foot/eye exam completion dates Latest Ref Rng & Units 07/02/2017 12/10/2016  Eye Exam No Retinopathy - -  Foot Form Completion - Done Done

## 2018-11-17 NOTE — Assessment & Plan Note (Signed)
After obtaining informed consent, the vaccine is  administered

## 2018-11-17 NOTE — Assessment & Plan Note (Signed)
Worsened in past 4 weeks, likely CHF the trigger

## 2018-11-17 NOTE — Assessment & Plan Note (Signed)
Current decompensation and Ed evaluation recommended repeatedly as hypoxic on 6 liters, and reports increased symptomatology x 4 weeks. Repeatedly refuses. Increase in diuretic with 10 day follow up, also with directiions to seek Ed eval asap, should he worsen Daily weights and sodium restriction recommended

## 2018-11-17 NOTE — Assessment & Plan Note (Signed)
Marked edema and weight gain x 4 weeks by history, diuretic dose increased with xlose f/u also needs card re eval

## 2018-11-18 ENCOUNTER — Encounter: Payer: Self-pay | Admitting: Cardiology

## 2018-11-18 ENCOUNTER — Ambulatory Visit: Payer: Medicare Other | Admitting: Cardiology

## 2018-11-18 ENCOUNTER — Telehealth: Payer: Self-pay | Admitting: *Deleted

## 2018-11-18 VITALS — BP 144/83 | HR 84 | Ht 75.0 in | Wt 237.4 lb

## 2018-11-18 DIAGNOSIS — I50813 Acute on chronic right heart failure: Secondary | ICD-10-CM

## 2018-11-18 DIAGNOSIS — I5033 Acute on chronic diastolic (congestive) heart failure: Secondary | ICD-10-CM | POA: Diagnosis not present

## 2018-11-18 DIAGNOSIS — I270 Primary pulmonary hypertension: Secondary | ICD-10-CM

## 2018-11-18 MED ORDER — TORSEMIDE 20 MG PO TABS: 20.0000 mg | ORAL_TABLET | Freq: Two times a day (BID) | ORAL | 6 refills | Status: AC

## 2018-11-18 NOTE — Telephone Encounter (Signed)
Attempted to call pt no answer

## 2018-11-18 NOTE — Telephone Encounter (Signed)
[name]

## 2018-11-18 NOTE — Patient Instructions (Signed)
Medication Instructions:   Stop Lasix (Furosemide).  Begin Torsemide 14m twice a day.  Continue all other medications.    Labwork: none  Testing/Procedures:   [facility] BH[facility]   Any Other Special Instructions Will Be Listed Below (If Applicable).  If you need a refill on your cardiac medications before your next appointment, please call your pharmacy.

## 2018-11-18 NOTE — Telephone Encounter (Signed)
pls let him know, I am thankful he has agreed to getting the help he needs in the hospital, we will call to have hime come for hosp f/u 1 to 2 weeks after he is discharged, so does not need to kleep appt on 01/10, but will need one probably the following week

## 2018-11-18 NOTE — Progress Notes (Signed)
Clinical Summary [name] is a 73 y.o.male seen today as a new patient, last seen in 2016. Seen for the following medical problems.     1. Pulmonary HTN - RHC showed Mean PA 45, PCWP 5, CI 2.  - [facility] at [facility] pulmonary HTN. On ambrisentan, tadalafil, treprostinil   2. Acute on chronic diastolic HF - seen by pcp with worsening hypoxia, weight gain. 211lb in 07/2018, up to 237 lbs in January.  - increased LE edema, abdominal distension. Reports some high sodium at home. Severe DOE with just a few steps until Thanksigiving. Home O2 up to 6L from 4 L.  - compliant with meds. Lasix increased to 23m tid, had been once daily. Feels like some mild improvement however weights have not decreased.       Past Medical History:  Diagnosis Date  . Asthma   . Carotid bruit   . COPD (chronic obstructive pulmonary disease) [place]   . Diabetes mellitus without complication [place]   . DOE (dyspnea on exertion)    2016  . ED (erectile dysfunction)   . History of renal calculi   . Hyperlipidemia   . Hypertension   . Hypoxia    2016  . PVD (peripheral vascular disease) (HCC)      Allergies  Allergen Reactions  . Metformin And Related Diarrhea    Cannot tolerate     Current Outpatient Medications  Medication Sig Dispense Refill  . albuterol (PROVENTIL HFA;VENTOLIN HFA) 108 (90 Base) MCG/ACT inhaler Inhale 2 puffs into the lungs as directed.    .Marland Kitchenambrisentan (LETAIRIS) 5 MG tablet Take 10 mg by mouth daily.     .Marland KitchenamLODipine (NORVASC) 5 MG tablet TAKE 1 TABLET BY MOUTH EVERY DAY 30 tablet 5  . atorvastatin (LIPITOR) 10 MG tablet TAKE 1 TABLET BY MOUTH EVERY DAY 90 tablet 0  . azelastine (ASTELIN) 0.1 % nasal spray Place 2 sprays into both nostrils 2 (two) times daily. Use in each nostril as directed 90 mL 1  . cetirizine (ZYRTEC) 10 MG tablet Take 10 mg by mouth daily.  11  . furosemide (LASIX) 20 MG tablet Take one tablet three times daily for 2 days, then one  tablet two times daily for 3 weeks 48 tablet 0  . hydrOXYzine (ATARAX/VISTARIL) 10 MG tablet One tablet at bedtime for itching, as needed 30 tablet 5  . insulin aspart (NOVOLOG) 100 UNIT/ML injection Inject 8-10 Units into the skin 3 (three) times daily as needed.    . Insulin Detemir (LEVEMIR) 100 UNIT/ML Pen Inject 10 Units into the skin daily at 10 pm. 15 mL 11  . potassium chloride (K-DUR) 10 MEQ tablet Take two tablets once daily with furosemide 60 tablet 5  . Tadalafil, PAH, (ADCIRCA) 20 MG TABS Take 2 tablets by mouth daily.    . Treprostinil Diolamine ER 2.5 MG TBCR Inhale 2.5 mg into the lungs 4 (four) times daily.     No current facility-administered medications for this visit.      Past Surgical History:  Procedure Laterality Date  . CARDIAC CATHETERIZATION N/A 05/31/2015   Procedure: Right Heart Cath;  Surgeon: MSherren Mocha MD;  [facility];  Service: Cardiovascular;  Laterality: N/A;  . CATARACT EXTRACTION W/PHACO Right 05/20/2017   Procedure: CATARACT EXTRACTION PHACO AND INTRAOCULAR LENS PLACEMENT (IOC);  Surgeon: SRutherford Guys MD;  Location: [facility];  Service: Ophthalmology;  Laterality: Right;  CDE: 7.20  . CATARACT EXTRACTION W/PHACO  Left 06/03/2017   Procedure: CATARACT EXTRACTION PHACO AND INTRAOCULAR LENS PLACEMENT (IOC);  Surgeon: Rutherford Guys, MD;  Location: [facility];  Service: Ophthalmology;  Laterality: Left;  CDE: 4.89  . 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 tubular adenomas  . COLONOSCOPY N/A 03/06/2015   KZS:WFUXNATFT coli/multiple rectal and colon polyps, tubular adenomas. Next colonoscopy April 2019.  [name] left wrist    . LITHOTRIPSY  2010  . ROTATOR CUFF REPAIR  1995   right      Allergies  Allergen Reactions  . Metformin And Related Diarrhea    Cannot tolerate      Family History  Problem Relation Age of Onset  .  Diabetes Sister      Social History [name] reports that he quit smoking about 5 years ago. He started smoking about 54 years ago. He smoked 0.50 packs per day. He has never used smokeless tobacco. [name] reports no history of alcohol use.   Review of Systems CONSTITUTIONAL: No weight loss, fever, chills, weakness or fatigue.  HEENT: Eyes: No visual loss, blurred vision, double vision or yellow sclerae.No hearing loss, sneezing, congestion, runny nose or sore throat.  SKIN: No rash or itching.  CARDIOVASCULAR: per hpi RESPIRATORY:per hpi GASTROINTESTINAL: No anorexia, nausea, vomiting or diarrhea. No abdominal pain or blood.  GENITOURINARY: No burning on urination, no polyuria NEUROLOGICAL: No headache, dizziness, syncope, paralysis, ataxia, numbness or tingling in the extremities. No change in bowel or bladder control.  MUSCULOSKELETAL: No muscle, back pain, joint pain or stiffness.  LYMPHATICS: No enlarged nodes. No history of splenectomy.  PSYCHIATRIC: No history of depression or anxiety.  ENDOCRINOLOGIC: No reports of sweating, cold or heat intolerance. No polyuria or polydipsia.  [name]   Physical Examination Vitals:   11/18/18 0953  BP: (!) 144/83  Pulse: 84  SpO2: 90%   Vitals:   11/18/18 0953  Weight: 237 lb 6.4 oz (107.7 kg)  Height: _0  (1.905 m)    Gen: resting comfortably, no acute distress HEENT: no scleral icterus, pupils equal round and reactive, no palptable cervical adenopathy,  CV: RRR, 2/6 systolic murmur LLSB. JVD to ear lobe Resp: crackles bilateral bases GI: abdomen is soft, non-tender, non-distended, normal bowel sounds, no hepatosplenomegaly MSK: extremities are warm, no edema.  Skin: warm, no rash Neuro:  no focal deficits Psych: appropriate affect   Diagnostic Studies  Jan 2016 echo Study Conclusions  - Left ventricle: The cavity size was normal. Wall thickness was increased increased in a pattern of mild to moderate  LVH. Systolic function was normal. The estimated ejection fraction was in the range of 60% to 65%. Wall motion was normal; there were no regional wall motion abnormalities. Doppler parameters are consistent with abnormal left ventricular relaxation (grade 1 diastolic dysfunction). - Aortic valve: Mildly calcified annulus. Trileaflet; mildly thickened leaflets. - Mitral valve: Mildly calcified annulus. Mildly thickened leaflets . - Left atrium: The atrium was mildly dilated. - Right ventricle: The cavity size was mildly to moderately dilated. The RV shares the apex with the LV. Systolic function was normal. RV TAPSE is 1.7 cm. - Right atrium: The atrium was moderately dilated. - Pulmonary arteries: Systolic pressure was moderately increased. PA peak pressure: 55 mm Hg (S). - Technically adequate study.   03/2013 Carotid US IMPRESSION:  1. Mild atherosclerotic plaque in the bilateral distal common and proximal internal carotid arteries resulting in less than 50% stenosis bilaterally.  No significant interval progression compared to January 2011.  2. Vertebral arteries are patent with antegrade flow.  11/2014 CT PE IMPRESSION: No evidence of pulmonary embolism.  Dependent atelectasis in both lower lobes.  Nonspecific enlarged mediastinal and hilar lymph nodes as above.  Differential diagnosis includes reactive processes, calcium in disease, metastatic disease and lymphoma.  Consider short-term followup CT in 6-8 weeks to reassess.   01/2015 nuclear stress IMPRESSION: 1. No reversible ischemia or infarction.  2. Normal left ventricular wall motion.  3. Left ventricular ejection fraction 61%  4. Low-risk stress test findings*.  05/2015 RHC Mean PA 45, PCWP 5, CI 2.   Assessment and Plan  1.Acute on chronic diastolic HF complicated by pulmonary HTN and RV dysfunction - severe 26 lbs weight gain since 07/2018, massively volume  overloaded by exam with significant SOB/DOE - continues to refuse admission, however is willing to come to the hospital Friday for management - we will d/c lasix, start torsemide 33m bid. [facility] what likely will be several days of IV diuresis. If he decides not to go he has been asked to call our office to make [place].  - will need repeat echo in setting of severe HF. Concern would be worsening right sided HF which may make diuresis difficult.   2. Idiopathic pulmonary arterial HTN [facility] Continue current triple therapy.    EKG today shows SR with long PR interval  JArnoldo Lenis M.D.

## 2018-11-18 NOTE — Telephone Encounter (Signed)
Pt called wanting to let Dr. Moshe Cipro know that he was going to be admitted in the hospital on 11/20/2018 per Dr. Harl Bowie and will be there a couple of days as an admission to have fluid taken off of him. He has an appt on Monday said he would keep it and if he needed to cancel he would call back Monday morning.

## 2018-11-20 ENCOUNTER — Emergency Department (HOSPITAL_COMMUNITY): Payer: Medicare Other

## 2018-11-20 ENCOUNTER — Inpatient Hospital Stay (HOSPITAL_COMMUNITY)
Admission: EM | Admit: 2018-11-20 | Discharge: 2018-11-23 | DRG: 291 | Disposition: A | Discharge status: Home / Self Care | Payer: Medicare Other | Attending: Internal Medicine | Admitting: Internal Medicine

## 2018-11-20 ENCOUNTER — Other Ambulatory Visit: Payer: Self-pay

## 2018-11-20 ENCOUNTER — Encounter (HOSPITAL_COMMUNITY): Payer: Self-pay

## 2018-11-20 DIAGNOSIS — E785 Hyperlipidemia, unspecified: Secondary | ICD-10-CM | POA: Diagnosis not present

## 2018-11-20 DIAGNOSIS — I10 Essential (primary) hypertension: Secondary | ICD-10-CM | POA: Diagnosis not present

## 2018-11-20 DIAGNOSIS — N183 Chronic kidney disease, stage 3 (moderate): Secondary | ICD-10-CM

## 2018-11-20 DIAGNOSIS — J449 Chronic obstructive pulmonary disease, unspecified: Secondary | ICD-10-CM | POA: Diagnosis present

## 2018-11-20 DIAGNOSIS — Z79899 Other long term (current) drug therapy: Secondary | ICD-10-CM

## 2018-11-20 DIAGNOSIS — J9621 Acute and chronic respiratory failure with hypoxia: Secondary | ICD-10-CM | POA: Diagnosis present

## 2018-11-20 DIAGNOSIS — I509 Heart failure, unspecified: Secondary | ICD-10-CM

## 2018-11-20 DIAGNOSIS — R609 Edema, unspecified: Secondary | ICD-10-CM | POA: Diagnosis present

## 2018-11-20 DIAGNOSIS — Z888 Allergy status to other drugs, medicaments and biological substances status: Secondary | ICD-10-CM

## 2018-11-20 DIAGNOSIS — I272 Pulmonary hypertension, unspecified: Secondary | ICD-10-CM | POA: Diagnosis present

## 2018-11-20 DIAGNOSIS — N182 Chronic kidney disease, stage 2 (mild): Secondary | ICD-10-CM | POA: Diagnosis not present

## 2018-11-20 DIAGNOSIS — J302 Other seasonal allergic rhinitis: Secondary | ICD-10-CM | POA: Diagnosis present

## 2018-11-20 DIAGNOSIS — I2510 Atherosclerotic heart disease of native coronary artery without angina pectoris: Secondary | ICD-10-CM | POA: Diagnosis present

## 2018-11-20 DIAGNOSIS — J9811 Atelectasis: Secondary | ICD-10-CM | POA: Diagnosis present

## 2018-11-20 DIAGNOSIS — D696 Thrombocytopenia, unspecified: Secondary | ICD-10-CM | POA: Diagnosis present

## 2018-11-20 DIAGNOSIS — I130 Hypertensive heart and chronic kidney disease with heart failure and stage 1 through stage 4 chronic kidney disease, or unspecified chronic kidney disease: Principal | ICD-10-CM | POA: Diagnosis present

## 2018-11-20 DIAGNOSIS — Z833 Family history of diabetes mellitus: Secondary | ICD-10-CM

## 2018-11-20 DIAGNOSIS — I2729 Other secondary pulmonary hypertension: Secondary | ICD-10-CM | POA: Diagnosis present

## 2018-11-20 DIAGNOSIS — I5033 Acute on chronic diastolic (congestive) heart failure: Secondary | ICD-10-CM | POA: Diagnosis not present

## 2018-11-20 DIAGNOSIS — Z87891 Personal history of nicotine dependence: Secondary | ICD-10-CM

## 2018-11-20 DIAGNOSIS — Z9841 Cataract extraction status, right eye: Secondary | ICD-10-CM

## 2018-11-20 DIAGNOSIS — E1151 Type 2 diabetes mellitus with diabetic peripheral angiopathy without gangrene: Secondary | ICD-10-CM | POA: Diagnosis present

## 2018-11-20 DIAGNOSIS — E1169 Type 2 diabetes mellitus with other specified complication: Secondary | ICD-10-CM

## 2018-11-20 DIAGNOSIS — E1122 Type 2 diabetes mellitus with diabetic chronic kidney disease: Secondary | ICD-10-CM | POA: Diagnosis present

## 2018-11-20 DIAGNOSIS — I2781 Cor pulmonale (chronic): Secondary | ICD-10-CM | POA: Diagnosis present

## 2018-11-20 DIAGNOSIS — Z9842 Cataract extraction status, left eye: Secondary | ICD-10-CM

## 2018-11-20 DIAGNOSIS — E1140 Type 2 diabetes mellitus with diabetic neuropathy, unspecified: Secondary | ICD-10-CM | POA: Diagnosis present

## 2018-11-20 DIAGNOSIS — Z9981 Dependence on supplemental oxygen: Secondary | ICD-10-CM

## 2018-11-20 DIAGNOSIS — Z9111 Patient's noncompliance with dietary regimen: Secondary | ICD-10-CM

## 2018-11-20 DIAGNOSIS — Z961 Presence of intraocular lens: Secondary | ICD-10-CM | POA: Diagnosis present

## 2018-11-20 HISTORY — DX: Pulmonary hypertension, unspecified: I27.20

## 2018-11-20 LAB — COMPREHENSIVE METABOLIC PANEL
ALT: 12 U/L (ref 0–44)
AST: 32 U/L (ref 15–41)
Albumin: 3.5 g/dL (ref 3.5–5.0)
Alkaline Phosphatase: 62 U/L (ref 38–126)
Anion gap: 8 (ref 5–15)
BUN: 20 mg/dL (ref 8–23)
CO2: 22 mmol/L (ref 22–32)
CREATININE: 1.39 mg/dL — AB (ref 0.61–1.24)
Calcium: 8.8 mg/dL — ABNORMAL LOW (ref 8.9–10.3)
Chloride: 107 mmol/L (ref 98–111)
GFR calc non Af Amer: 50 mL/min — ABNORMAL LOW (ref >60)
GFR, EST AFRICAN AMERICAN: 58 mL/min — AB (ref >60)
Glucose, Bld: 93 mg/dL (ref 70–99)
Potassium: 4.9 mmol/L (ref 3.5–5.1)
SODIUM: 137 mmol/L (ref 135–145)
Total Bilirubin: 2.6 mg/dL — ABNORMAL HIGH (ref 0.3–1.2)
Total Protein: 7.7 g/dL (ref 6.5–8.1)

## 2018-11-20 LAB — CBC WITH DIFFERENTIAL/PLATELET
Abs Immature Granulocytes: 0.02 10*3/uL (ref 0.00–0.07)
Basophils Absolute: 0 10*3/uL (ref 0.0–0.1)
Basophils Relative: 0 %
Eosinophils Absolute: 0.1 10*3/uL (ref 0.0–0.5)
Eosinophils Relative: 2 %
HCT: 49 % (ref 39.0–52.0)
Hemoglobin: 15.5 g/dL (ref 13.0–17.0)
Immature Granulocytes: 0 %
Lymphocytes Relative: 31 %
Lymphs Abs: 1.9 10*3/uL (ref 0.7–4.0)
MCH: 29.5 pg (ref 26.0–34.0)
MCHC: 31.6 g/dL (ref 30.0–36.0)
MCV: 93.3 fL (ref 80.0–100.0)
MONO ABS: 0.4 10*3/uL (ref 0.1–1.0)
Monocytes Relative: 7 %
Neutro Abs: 3.6 10*3/uL (ref 1.7–7.7)
Neutrophils Relative %: 60 %
Platelets: 106 10*3/uL — ABNORMAL LOW (ref 150–400)
RBC: 5.25 MIL/uL (ref 4.22–5.81)
RDW: 15.9 % — ABNORMAL HIGH (ref 11.5–15.5)
WBC: 6 10*3/uL (ref 4.0–10.5)
nRBC: 0 % (ref 0.0–0.2)

## 2018-11-20 LAB — GLUCOSE, CAPILLARY: GLUCOSE-CAPILLARY: 120 mg/dL — AB (ref 70–99)

## 2018-11-20 LAB — BRAIN NATRIURETIC PEPTIDE: B Natriuretic Peptide: 597 pg/mL — ABNORMAL HIGH (ref 0.0–100.0)

## 2018-11-20 LAB — TROPONIN I: Troponin I: <0.03 ng/mL (ref <0.03)

## 2018-11-20 MED ORDER — TADALAFIL (PAH) 20 MG PO TABS
40.0000 mg | ORAL_TABLET | Freq: Every day | ORAL | Status: DC
  Filled 2018-11-20 (×5): qty 2

## 2018-11-20 MED ORDER — ATORVASTATIN CALCIUM 10 MG PO TABS
10.0000 mg | ORAL_TABLET | Freq: Every day | ORAL | Status: DC
  Filled 2018-11-20: qty 1

## 2018-11-20 MED ORDER — SODIUM CHLORIDE 0.9 % IV SOLN: 250.0000 mL | INTRAVENOUS | Status: DC | PRN

## 2018-11-20 MED ORDER — FUROSEMIDE 10 MG/ML IJ SOLN
60.0000 mg | Freq: Two times a day (BID) | INTRAMUSCULAR | Status: DC
  Administered 2018-11-20 – 2018-11-23 (×6): 60 mg via INTRAVENOUS
  Filled 2018-11-20 (×6): qty 6

## 2018-11-20 MED ORDER — TREPROSTINIL DIOLAMINE ER 2.5 MG PO TBCR: 2.5000 mg | EXTENDED_RELEASE_TABLET | Freq: Four times a day (QID) | ORAL | Status: DC

## 2018-11-20 MED ORDER — SODIUM CHLORIDE 0.9% FLUSH
3.0000 mL | Freq: Two times a day (BID) | INTRAVENOUS | Status: DC
  Administered 2018-11-21 – 2018-11-23 (×5): 3 mL via INTRAVENOUS

## 2018-11-20 MED ORDER — SODIUM CHLORIDE 0.9% FLUSH: 3.0000 mL | INTRAVENOUS | Status: DC | PRN

## 2018-11-20 MED ORDER — FUROSEMIDE 10 MG/ML IJ SOLN
60.0000 mg | Freq: Once | INTRAMUSCULAR | Status: AC
  Administered 2018-11-20: 60 mg via INTRAVENOUS
  Filled 2018-11-20: qty 6

## 2018-11-20 MED ORDER — INSULIN ASPART 100 UNIT/ML ~~LOC~~ SOLN
0.0000 [IU] | Freq: Three times a day (TID) | SUBCUTANEOUS | Status: DC
  Administered 2018-11-21: 1 [IU] via SUBCUTANEOUS

## 2018-11-20 MED ORDER — ALBUTEROL SULFATE (2.5 MG/3ML) 0.083% IN NEBU: 2.5000 mg | INHALATION_SOLUTION | Freq: Four times a day (QID) | RESPIRATORY_TRACT | Status: DC | PRN

## 2018-11-20 MED ORDER — AZELASTINE HCL 0.1 % NA SOLN
2.0000 | Freq: Two times a day (BID) | NASAL | Status: DC
  Filled 2018-11-20: qty 30

## 2018-11-20 MED ORDER — AMBRISENTAN 5 MG PO TABS
10.0000 mg | ORAL_TABLET | Freq: Every day | ORAL | Status: DC
  Filled 2018-11-20 (×5): qty 2

## 2018-11-20 MED ORDER — ONDANSETRON HCL 4 MG/2ML IJ SOLN: 4.0000 mg | Freq: Four times a day (QID) | INTRAMUSCULAR | Status: DC | PRN

## 2018-11-20 MED ORDER — ALBUTEROL SULFATE HFA 108 (90 BASE) MCG/ACT IN AERS: 2.0000 | INHALATION_SPRAY | RESPIRATORY_TRACT | Status: DC

## 2018-11-20 MED ORDER — ENOXAPARIN SODIUM 40 MG/0.4ML ~~LOC~~ SOLN
40.0000 mg | SUBCUTANEOUS | Status: DC
  Administered 2018-11-20 – 2018-11-21 (×2): 40 mg via SUBCUTANEOUS
  Filled 2018-11-20 (×3): qty 0.4

## 2018-11-20 MED ORDER — LORATADINE 10 MG PO TABS
10.0000 mg | ORAL_TABLET | Freq: Every day | ORAL | Status: DC
  Filled 2018-11-20: qty 1

## 2018-11-20 MED ORDER — ACETAMINOPHEN 325 MG PO TABS: 650.0000 mg | ORAL_TABLET | ORAL | Status: DC | PRN

## 2018-11-20 NOTE — ED Notes (Signed)
Emptied patient's urinal of 900 ml urine. States that is the second time patient has filled urinal so possible 1800 ml urine output at this time.

## 2018-11-20 NOTE — Consult Note (Addendum)
Cardiology Consult    Patient ID: [name]; [id number]; 1946/03/09   Admit date: 11/20/2018 Date of Consult: 11/20/2018  Primary Care Provider: Fayrene Helper, MD Primary Cardiologist: Carlyle Dolly, MD   Patient Profile    [name] is a 73 y.o. male with past medical history of chronic diastolic CHF, Idiopathic Pulmonary HTN, HTN, HLD, and Stage 3 CKD who is being seen today for the evaluation of CHF at the request of Dr. Lita Mains.   History of Present Illness    [name] was recently evaluated by Dr. Harl Bowie on 11/18/2018 and reported a 26 pound weight gain since 07/2018 with worsening lower extremity edema and abdominal distension. Had been on 4L [place] at baseline but recently increased this to 6L. Weight was at 237 lbs and it was recommended he be admitted for IV diuresis but he declined at that time but reported being willing to come to the hospital on Friday. Lasix was discontinued and he was transitioned to Torsemide 77m BID.    [facility] for planned admission as outlined at the time of his office visit. Initial labs show WBC 6.0, Hgb 15.5, platelets 106, Na+ 137, K+ 4.9, and creatinine 1.39 (baseline creatinine 1.2 - 1.3). Initial troponin negative. BNP elevated to 597. CXR shows mild bibasilar subsegmental atelectasis with minimal pleural effusions. EKG shows NSR, HR 88, with 1st degree AV block with incomplete RBBB and inferior TWI along lateral leads .   He was administered IV Lasix 60 mg at 1000 and has a recorded output of -2.0L thus far.   He reports having never picked up his Rx for Torsemide and had remained on PO Lasix. He reports progressive dyspnea on exertion, abdominal distension, and lower extremity edema over the past month. Denies any chest pain or palpitations.   Past Medical History:  Diagnosis Date  . Asthma   . Carotid bruit   . COPD (chronic obstructive pulmonary disease) ([facility]   . Diabetes mellitus  without complication ([facility]   . DOE (dyspnea on exertion)    2016  . ED (erectile dysfunction)   . History of renal calculi   . Hyperlipidemia   . Hypertension   . Hypoxia    2016  . Pulmonary hypertension ([facility]   . PVD (peripheral vascular disease) ([facility]     Past Surgical History:  Procedure Laterality Date  . CARDIAC CATHETERIZATION N/A 05/31/2015   Procedure: Right Heart Cath;  Surgeon: MSherren Mocha MD;  [facility];  Service: Cardiovascular;  Laterality: N/A;  . CATARACT EXTRACTION W/PHACO Right 05/20/2017   Procedure: CATARACT EXTRACTION PHACO AND INTRAOCULAR LENS PLACEMENT (IOC);  Surgeon: SRutherford Guys MD;  Location: [facility];  Service: Ophthalmology;  Laterality: Right;  CDE: 7.20  . CATARACT EXTRACTION W/PHACO Left 06/03/2017   Procedure: CATARACT EXTRACTION PHACO AND INTRAOCULAR LENS PLACEMENT (IOC);  Surgeon: SRutherford Guys MD;  Location: [facility];  Service: Ophthalmology;  Laterality: Left;  CDE: 4.89  . 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 tubular adenomas  . COLONOSCOPY N/A 03/06/2015   RALP:FXTKWIOXBcoli/multiple rectal and colon polyps, tubular adenomas. Next colonoscopy April 2019.  .Marland Kitchenleft wrist    . LITHOTRIPSY  2010  . ROdum  right      Home Medications:  Prior to Admission medications   Medication Sig Start Date End Date Taking?  Authorizing Provider  albuterol (PROVENTIL HFA;VENTOLIN HFA) 108 (90 Base) MCG/ACT inhaler Inhale 2 puffs into the lungs as directed. 12/07/14  Yes [provider]  ambrisentan (LETAIRIS) 5 MG tablet Take 10 mg by mouth daily.    Yes [provider]  amLODipine (NORVASC) 5 MG tablet TAKE 1 TABLET BY MOUTH EVERY DAY 06/02/18  Yes Fayrene Helper, MD  atorvastatin (LIPITOR) 10 MG tablet TAKE 1 TABLET BY MOUTH EVERY DAY 04/07/18  Yes Fayrene Helper, MD  azelastine  (ASTELIN) 0.1 % nasal spray Place 2 sprays into both nostrils 2 (two) times daily. Use in each nostril as directed 09/12/17  Yes Fayrene Helper, MD  cetirizine (ZYRTEC) 10 MG tablet Take 10 mg by mouth daily. 01/27/18  Yes [provider]  hydrOXYzine (ATARAX/VISTARIL) 10 MG tablet One tablet at bedtime for itching, as needed 11/16/18 04/16/19 Yes Fayrene Helper, MD  potassium chloride (K-DUR) 10 MEQ tablet Take two tablets once daily with furosemide 07/28/18  Yes Fayrene Helper, MD  Tadalafil, PAH, (ADCIRCA) 20 MG TABS Take 2 tablets by mouth daily.   Yes [provider]  torsemide (DEMADEX) 20 MG tablet Take 1 tablet (20 mg total) by mouth 2 (two) times daily. 11/18/18 02/16/19 Yes BranchAlphonse Guild, MD  Treprostinil Diolamine ER 2.5 MG TBCR Inhale 2.5 mg into the lungs 4 (four) times daily.   Yes [provider]    Inpatient Medications: Scheduled Meds:  Continuous Infusions:  PRN Meds:   Allergies:    Allergies  Allergen Reactions  . Metformin And Related Diarrhea    Cannot tolerate    Social History:   Social History   Socioeconomic History  . Marital status: Married    Spouse name: Not on file  . Number of children: Not on file  . Years of education: Not on file  . Highest education level: Not on file  Occupational History  . Not on file  Social Needs  . Financial resource strain: Not hard at all  . Food insecurity:    Worry: Never true    Inability: Never true  . Transportation needs:    Medical: No    Non-medical: No  Tobacco Use  . Smoking status: Former Smoker    Packs/day: 0.50    Start date: 05/04/1964    Last attempt to quit: 02/09/2013    Years since quitting: 5.7  . Smokeless tobacco: Never Used  Substance and Sexual Activity  . Alcohol use: No    Alcohol/week: 0.0 standard drinks  . Drug use: No  . Sexual activity: Not on file  Lifestyle  . Physical activity:    Days per week: 0 days    Minutes per session: 0 min    . Stress: Not at all  Relationships  . Social connections:    Talks on phone: More than three times a week    Gets together: Once a week    Attends religious service: More than 4 times per year    Active member of club or organization: Yes    Attends meetings of clubs or organizations: More than 4 times per year    Relationship status: Married  . Intimate partner violence:    Fear of current or ex partner: No    Emotionally abused: No    Physically abused: No    Forced sexual activity: No  Other Topics Concern  . Not on file  Social History Narrative  . Not on file  Family History:    Family History  Problem Relation Age of Onset  . Diabetes Sister       Review of Systems    General:  No chills, fever, night sweats or weight changes.  Cardiovascular:  No chest pain, orthopnea, palpitations, paroxysmal nocturnal dyspnea. Positive for dyspnea on exertion and edema.  Dermatological: No rash, lesions/masses Respiratory: No cough, Positive for dyspnea.  Urologic: No hematuria, dysuria Abdominal:   No nausea, vomiting, diarrhea, bright red blood per rectum, melena, or hematemesis Neurologic:  No visual changes, wkns, changes in mental status. All other systems reviewed and are otherwise negative except as noted above.  Physical Exam/Data    Vitals:   11/20/18 1000 11/20/18 1100 11/20/18 1130 11/20/18 1230  BP: 133/83 116/67 138/74 121/70  Pulse: 90 79 90   Resp: 13 16 (!) 21 16  Temp:      TempSrc:      SpO2: 95% 96% 98% 97%  Weight:      Height:        Intake/Output Summary (Last 24 hours) at 11/20/2018 1305 Last data filed at 11/20/2018 1226 Gross per 24 hour  Intake -  Output 1000 ml  Net -1000 ml   Filed Weights   11/20/18 0923  Weight: 107.5 kg   Body mass index is 29.62 kg/m.   General: Pleasant male appearing in NAD Psych: Normal affect. Neuro: Alert and oriented X 3. Moves all extremities spontaneously. HEENT: Normal  Neck: Supple without  bruits. JVD at 10 cm. Lungs:  Resp regular and unlabored, decreased breath sounds along bases bilaterally. Heart: RRR no s3, s4, or murmurs. Abdomen: Soft, non-tender, non-distended, BS + x 4.  Extremities: No clubbing. 2+ pitting edema. DP/PT/Radials 2+ and equal bilaterally.   Labs/Studies     Relevant CV Studies:  Echocardiogram: 02/2016 ECHOCARDIOGRAPHIC DESCRIPTIONS  AORTIC ROOT Size:Normal Dissection:INDETERM FOR DISSECTION  AORTIC VALVE Leaflets:Tricuspid Morphology:MILDLY THICKENED Mobility:Fully mobile  LEFT VENTRICLE Size:NormalAnterior:Normal  Contraction:Normal Lateral:Normal Closest EF:>55% (Estimated)Septal:Normal  LV Masses:No Masses Apical:Normal  HDQ:QIWL LVHInferior:Normal Posterior:Normal Dias.FxClass:can't be determined  MITRAL VALVE Leaflets:NormalMobility:Fully mobile Morphology:THICKENED LEAFLET(S)  LEFT ATRIUM Size:MILDLY ENLARGEDLA Masses:No masses  IA Septum:Normal IAS  MAIN PA Size:Normal  PULMONIC VALVE Morphology:NormalMobility:Fully mobile  RIGHT VENTRICLE  RV Masses:No Masses Size:SEVERELY ENLARGED  Free Wall:Normal Contraction:SEVERE GLOBAL DECREASE  TRICUSPID VALVE Leaflets:NormalMobility:Fully mobile Morphology:ANNULAR CALC  RIGHT ATRIUM Size:SEVERELY ENLARGED RA Other:None  RA Mass:No masses  PERICARDIUM  Fluid:No effusion  INFERIOR VENACAVA Size:DILATED Normal respiratory collapse  Laboratory Data:  Chemistry Recent Labs  Lab 11/20/18 0936  NA 137  K 4.9  CL 107  CO2 22  GLUCOSE 93  BUN 20  CREATININE 1.39*  CALCIUM 8.8*  GFRNONAA 50*  GFRAA 58*  ANIONGAP 8    Recent Labs  Lab  11/20/18 0936  PROT 7.7  ALBUMIN 3.5  AST 32  ALT 12  ALKPHOS 62  BILITOT 2.6*   Hematology Recent Labs  Lab 11/20/18 0936  WBC 6.0  RBC 5.25  HGB 15.5  HCT 49.0  MCV 93.3  MCH 29.5  MCHC 31.6  RDW 15.9*  PLT 106*   Cardiac Enzymes Recent Labs  Lab 11/20/18 0936  TROPONINI <0.03   No results for input(s): TROPIPOC in the last 168 hours.  BNP Recent Labs  Lab 11/20/18 0936  BNP 597.0*    DDimer No results for input(s): DDIMER in the last 168 hours.  Radiology/Studies:  Dg Chest 2 View  Result Date: 11/20/2018 CLINICAL DATA:  Nonproductive cough, dyspnea. EXAM:  CHEST - 2 VIEW COMPARISON:  CT scan of February 12, 2016. Radiographs of December 06, 2014. FINDINGS: Stable cardiomediastinal silhouette is noted. No pneumothorax is noted. Mild bibasilar subsegmental atelectasis is noted. Minimal pleural effusions may be present. Bony thorax is unremarkable. IMPRESSION: Mild bibasilar subsegmental atelectasis with minimal pleural effusions. Electronically Signed   By: Marijo Conception, M.D.   On: 11/20/2018 10:28     Assessment & Plan    1. Acute on Chronic Diastolic CHF - recently evaluated in clinic and reported worsening dyspnea, edema, and abdominal distension in the setting of a 26 lb weight gain over the past 3-4 months. Admission was recommended at that time but the patient declined but was in agreement to present to the hospital today.  - BNP elevated to 597. CXR shows mild bibasilar subsegmental atelectasis with minimal pleural effusions. Received IV Lasix 60 mg at 1000 and has a recorded output of -2.0L thus far. Admit for IV diuresis (can start at 24m BID or 819mBID) with plans for a repeat BMET tomorrow morning. Follow I&O's along with daily weights.  2. [facility]  - continue PTA Ambrisentan, Tadalafil, and Treprostinil.  3. HTN - BP well-controlled at 116/67 - 138/86 since admission.  -  continue PTA medication regimen.   4. Stage 3 CKD - baseline creatinine 1.2 - 1.3. At 1.39 on admission. Follow with IV diuresis.   For questions or updates, please contact [facility] consult [email or web address] for contact info under Cardiology/STEMI.  Signed, BrErma HeritagePA-C 11/20/2018, 1:05 PM Pager: [phone] patient was seen and examined, and I agree with the history, physical exam, assessment and plan as documented above, with modifications as noted below. I have also personally reviewed all relevant documentation, old records, labs, and both radiographic and cardiovascular studies. I have also independently interpreted old and new ECG's.  Briefly, this is a [age]ear old male with a history of chronic diastolic heart failure and idiopathic pulmonary hypertension with stage III chronic kidney disease.  He was seen in the office by Dr. BrHarl Bowien 11/18/2018.  The patient told me he is put on 26 pounds over the last several months.  He uses 4 L of oxygen at rest and has to use 6 L of oxygen with exertion.  It was recommended he go to the hospital for IV diuresis but he declined and said he would come today.  Lasix was discontinued and he was prescribed torsemide but he has yet to pick it up.  At the time of my evaluation, he had already received 60 mg of IV Lasix and has put out at least 2.5 L thus far.  He feels like his breathing has improved to some degree.  60 mg IV Lasix twice daily has been ordered.  An echocardiogram has also been ordered and is pending at the time of this dictation.  Previous echocardiogram from April 2017 reviewed above.  At that time the right ventricle was severely enlarged with severely reduced systolic function. Left ventricular systolic function was normal.  I told the patient and family member that he will require several days of IV diuresis.  I will review the echocardiogram once performed.  Continue ambrisentan, tadalafil, and  treprostinil.   SuKate SableMD, FASt. Vincent Physicians Medical Center1/08/2019 2:56 PM

## 2018-11-20 NOTE — ED Triage Notes (Signed)
Pt reports he has been SOB for one week, swelling in abdomen and legs. Seen by Dr Harl Bowie 2 days ago. Adjustments made in fluid medication but was instructed to follow up in ED today

## 2018-11-20 NOTE — ED Provider Notes (Signed)
[facility] EMERGENCY DEPARTMENT Provider Note   CSN: [id number] Arrival date & time: 11/20/18  0907     History   Chief Complaint Chief Complaint  Patient presents with  . Shortness of Breath    HPI [name] is a 73 y.o. male.  HPI Patient with increased shortness of breath for the past few months.  Was seen by Dr. Harl Bowie and advised to come to the emergency department for admission for diuresis.  States he has been compliant with his medications.  Has increased dyspnea with minimal exertion and increased lower extremity swelling.  No cough, fever or chills.  Denies chest pain. Past Medical History:  Diagnosis Date  . Asthma   . Carotid bruit   . COPD (chronic obstructive pulmonary disease) ([place])   . Diabetes mellitus without complication ([place])   . DOE (dyspnea on exertion)    2016  . ED (erectile dysfunction)   . History of renal calculi   . Hyperlipidemia   . Hypertension   . Hypoxia    2016  . Pulmonary hypertension ([place])   . PVD (peripheral vascular disease) [facility])     Patient Active Problem List   Diagnosis Date Noted  . Need for immunization against influenza 11/17/2018  . Edema 11/12/2018  . Acute on chronic diastolic heart failure ([place]) 11/12/2018  . Callus of foot 03/18/2018  . Skin tag 12/15/2016  . Pulmonary hypertension ([place])   . Tubular adenoma of colon 05/12/2015  . Vitamin D deficiency 04/05/2015  . 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 CAD in native artery 01/02/2015  . Mediastinal adenopathy 12/07/2014  . Hypoxia 12/06/2014  . DOE (dyspnea on exertion) 12/06/2014  . Poor vision 12/06/2014  . Seasonal allergies 01/18/2014  . Diabetes mellitus, insulin dependent (IDDM), controlled ([place]) 06/28/2013  . Diabetic neuropathy ([place]) 03/24/2013  . Carotid bruit 03/24/2013  . Kidney stone on left side 01/25/2011  . PERIPHERAL VASCULAR DISEASE 05/31/2010  . FATIGUE 01/25/2010  . OTH NONSPECIFIC ABNORM CV  SYSTEM FUNCTION STUDY 11/07/2009  . Hyperlipidemia with target LDL less than 100 12/14/2008  . ERECTILE DYSFUNCTION 12/14/2008  . Essential hypertension 03/09/2008    Past Surgical History:  Procedure Laterality Date  . CARDIAC CATHETERIZATION N/A 05/31/2015   Procedure: Right Heart Cath;  Surgeon: Sherren Mocha, MD;  Location: [place] CV LAB;  Service: Cardiovascular;  Laterality: N/A;  . CATARACT EXTRACTION W/PHACO Right 05/20/2017   Procedure: CATARACT EXTRACTION PHACO AND INTRAOCULAR LENS PLACEMENT (IOC);  Surgeon: Rutherford Guys, MD;  Location: [facility];  Service: Ophthalmology;  Laterality: Right;  CDE: 7.20  . CATARACT EXTRACTION W/PHACO Left 06/03/2017   Procedure: CATARACT EXTRACTION PHACO AND INTRAOCULAR LENS PLACEMENT (IOC);  Surgeon: Rutherford Guys, MD;  Location: [facility];  Service: Ophthalmology;  Laterality: Left;  CDE: 4.89  . 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 tubular adenomas  . COLONOSCOPY N/A 03/06/2015   NWG:[id number] coli/multiple rectal and colon polyps, tubular adenomas. Next colonoscopy April 2019.  [name] left wrist    . LITHOTRIPSY  2010  . [place]   right         Home Medications    Prior to Admission medications   Medication Sig Start Date End Date Taking? Authorizing Provider  albuterol (PROVENTIL HFA;VENTOLIN HFA) 108 (90 Base) MCG/ACT inhaler Inhale 2 puffs into the lungs as directed. 12/07/14  Yes [provider]  ambrisentan (LETAIRIS) 5 MG tablet Take 10 mg by mouth daily.    Yes [provider]  amLODipine (NORVASC) 5 MG tablet TAKE 1 TABLET BY MOUTH EVERY DAY 06/02/18  Yes Fayrene Helper, MD  atorvastatin (LIPITOR) 10 MG tablet TAKE 1 TABLET BY MOUTH EVERY DAY 04/07/18  Yes Fayrene Helper, MD  azelastine (ASTELIN) 0.1 % nasal spray Place 2 sprays into both nostrils 2 (two) times daily. Use in each  nostril as directed 09/12/17  Yes Fayrene Helper, MD  cetirizine (ZYRTEC) 10 MG tablet Take 10 mg by mouth daily. 01/27/18  Yes [provider]  hydrOXYzine (ATARAX/VISTARIL) 10 MG tablet One tablet at bedtime for itching, as needed 11/16/18 04/16/19 Yes Fayrene Helper, MD  potassium chloride (K-DUR) 10 MEQ tablet Take two tablets once daily with furosemide 07/28/18  Yes Fayrene Helper, MD  Tadalafil, PAH, (ADCIRCA) 20 MG TABS Take 2 tablets by mouth daily.   Yes [provider]  torsemide (DEMADEX) 20 MG tablet Take 1 tablet (20 mg total) by mouth 2 (two) times daily. 11/18/18 02/16/19 Yes BranchAlphonse Guild, MD  Treprostinil Diolamine ER 2.5 MG TBCR Inhale 2.5 mg into the lungs 4 (four) times daily.   Yes [provider]    Family History Family History  Problem Relation Age of Onset  . Diabetes Sister     Social History Social History   Tobacco Use  . Smoking status: Former Smoker    Packs/day: 0.50    Start date: 05/04/1964    Last attempt to quit: 02/09/2013    Years since quitting: 5.7  . Smokeless tobacco: Never Used  Substance Use Topics  . Alcohol use: No    Alcohol/week: 0.0 standard drinks  . Drug use: No     Allergies   Metformin and related   Review of Systems Review of Systems  Constitutional: Negative for chills and fever.  HENT: Negative for sinus pressure and trouble swallowing.   Respiratory: Positive for shortness of breath. Negative for cough.   Cardiovascular: Positive for leg swelling. Negative for chest pain and palpitations.  Gastrointestinal: Negative for abdominal pain, constipation, diarrhea, nausea and vomiting.  Genitourinary: Negative for flank pain and frequency.  Musculoskeletal: Negative for back pain, neck pain and neck stiffness.  Skin: Negative for rash and wound.  Neurological: Negative for dizziness, weakness, light-headedness, numbness and headaches.  All other systems reviewed and are  negative.    Physical Exam Updated Vital Signs BP 116/67   Pulse 79   Temp 97.7 F (36.5 C) (Oral)   Resp 16   Ht _0  (1.905 m)   Wt 107.5 kg   SpO2 96%   BMI 29.62 kg/m   Physical Exam Vitals signs and nursing note reviewed.  Constitutional:      Appearance: Normal appearance. He is well-developed.  HENT:     Head: Normocephalic and atraumatic.     Nose: Nose normal.  Eyes:     Extraocular Movements: Extraocular movements intact.     Pupils: Pupils are equal, round, and reactive to light.  Neck:     Musculoskeletal: Normal range of motion and neck supple. No neck rigidity or muscular tenderness.     Vascular: No carotid bruit.  Cardiovascular:     Rate and Rhythm: Normal rate and regular rhythm.  Pulmonary:     Effort: Pulmonary effort is normal.     Breath sounds: Rales present.     Comments: Crackles in bilateral bases Abdominal:  General: Bowel sounds are normal.     Palpations: Abdomen is soft.     Tenderness: There is no abdominal tenderness. There is no right CVA tenderness, left CVA tenderness, guarding or rebound.  Musculoskeletal: Normal range of motion.        General: No tenderness.     Right lower leg: Edema present.     Left lower leg: Edema present.     Comments: 3+ edema bilateral lower extremities.  Distal pulses intact.  No asymmetry.  Lymphadenopathy:     Cervical: No cervical adenopathy.  Skin:    General: Skin is warm and dry.     Findings: No erythema or rash.  Neurological:     General: No focal deficit present.     Mental Status: He is alert and oriented to person, place, and time.     Comments: Moves all extremities without deficit.  Sensation intact.  Psychiatric:        Mood and Affect: Mood normal.        Behavior: Behavior normal.      ED Treatments / Results  Labs (all labs ordered are listed, but only abnormal results are displayed) Labs Reviewed  CBC WITH DIFFERENTIAL/PLATELET - Abnormal; Notable for the following  components:      Result Value   RDW 15.9 (*)    Platelets 106 (*)    All other components within normal limits  BRAIN NATRIURETIC PEPTIDE - Abnormal; Notable for the following components:   B Natriuretic Peptide 597.0 (*)    All other components within normal limits  COMPREHENSIVE METABOLIC PANEL - Abnormal; Notable for the following components:   Creatinine, Ser 1.39 (*)    Calcium 8.8 (*)    Total Bilirubin 2.6 (*)    GFR calc non Af Amer 50 (*)    GFR calc Af Amer 58 (*)    All other components within normal limits  TROPONIN I    EKG EKG Interpretation  Date/Time:  Friday November 20 2018 09:24:33 EST Ventricular Rate:  88 PR Interval:    QRS Duration: 101 QT Interval:  389 QTC Calculation: 471 R Axis:   117 Text Interpretation:  Sinus rhythm Prolonged PR interval Probable left atrial enlargement Low voltage, precordial leads RVH with secondary repolarization abnrm Nonspecific T abnormalities, lateral leads Confirmed by Julianne Rice [phone]) on 11/20/2018 9:50:08 AM   Radiology Dg Chest 2 View  Result Date: 11/20/2018 CLINICAL DATA:  Nonproductive cough, dyspnea. EXAM: CHEST - 2 VIEW COMPARISON:  CT scan of February 12, 2016. Radiographs of December 06, 2014. FINDINGS: Stable cardiomediastinal silhouette is noted. No pneumothorax is noted. Mild bibasilar subsegmental atelectasis is noted. Minimal pleural effusions may be present. Bony thorax is unremarkable. IMPRESSION: Mild bibasilar subsegmental atelectasis with minimal pleural effusions. Electronically Signed   By: Marijo Conception, M.D.   On: 11/20/2018 10:28    Procedures Procedures (including critical care time)  Medications Ordered in ED Medications  furosemide (LASIX) injection 60 mg (60 mg Intravenous Given 11/20/18 1004)     Initial Impression / Assessment and Plan / ED Course  I have reviewed the triage vital signs and the nursing notes.  Pertinent labs & imaging results that were available during my care of  the patient were reviewed by me and considered in my medical decision making (see chart for details).    Patient with obvious fluid overload.  Given 60 mg of IV Lasix with diuresis of roughly 1800 cc of urine.  Discussed with Dr.  Koneswaran.  Admit to hospitalist and cardiology will consult. Spoke with hospitalist who will see patient in the emergency department.   Final Clinical Impressions(s) / ED Diagnoses   Final diagnoses:  Acute on chronic congestive heart failure, unspecified heart failure type [facility])    ED Discharge Orders    None       Julianne Rice, MD 11/20/18 1140

## 2018-11-20 NOTE — H&P (Signed)
History and Physical    [name]:[id number] DOB: 1946-09-20 DOA: 11/20/2018  I have briefly reviewed the patient's prior medical records in [place]  PCP: Fayrene Helper, MD  Patient coming from: Home  Chief Complaint: Worsening shortness of breath, leg swelling, weight gain  HPI: [name] is a 73 y.o. male with medical history significant of diastolic CHF, pulmonary hypertension with chronic hypoxic respiratory failure on 4 L nasal cannula at home, chronic kidney disease stage II, diet-controlled diabetes mellitus, COPD, hypertension, hyperlipidemia who presents to the hospital with chief complaint of increased swelling in his legs, 26 pound weight gain and worsening shortness of breath.  Patient tells me that at baseline he is short of breath and has been so for several years due to his pulmonary hypertension.  However, in the last 1 to 2 weeks, he has noticed significant leg swelling, increased weight as well as progressive shortness of breath, he is able to take 3-4 steps before becoming dyspneic and the need to stop.  He was seen by cardiology 2 days ago and at that time his diuretics were planned to be changed to torsemide however patient has not started that.  He denies any chest pains, denies any abdominal pain, no nausea or vomiting.  He denies any fever or chills.  He also has noticed that he is needed to increase his oxygen from 4 L at home to 6 L  ED Course: In the ED his vital signs are stable but he is slightly tachypneic, required 6 L of nasal cannula, his blood work shows a creatinine of 1.39, total bilirubin is 2.6, BNP is elevated to 597, his platelets are low at 106.  He was given IV Lasix, cardiology was consulted and we are asked to admit  Review of Systems: As per HPI otherwise 10 point review of systems negative.   Past Medical History:  Diagnosis Date  . Asthma   . Carotid bruit   . COPD (chronic obstructive pulmonary disease) ([place])   .  Diabetes mellitus without complication ([place])   . DOE (dyspnea on exertion)    2016  . ED (erectile dysfunction)   . History of renal calculi   . Hyperlipidemia   . Hypertension   . Hypoxia    2016  . Pulmonary hypertension ([place])   . PVD (peripheral vascular disease) ([place])     Past Surgical History:  Procedure Laterality Date  . CARDIAC CATHETERIZATION N/A 05/31/2015   Procedure: Right Heart Cath;  Surgeon: Sherren Mocha, MD;  Location: [place] CV LAB;  Service: Cardiovascular;  Laterality: N/A;  . CATARACT EXTRACTION W/PHACO Right 05/20/2017   Procedure: CATARACT EXTRACTION PHACO AND INTRAOCULAR LENS PLACEMENT (IOC);  Surgeon: Rutherford Guys, MD;  Location: [facility];  Service: Ophthalmology;  Laterality: Right;  CDE: 7.20  . CATARACT EXTRACTION W/PHACO Left 06/03/2017   Procedure: CATARACT EXTRACTION PHACO AND INTRAOCULAR LENS PLACEMENT (IOC);  Surgeon: Rutherford Guys, MD;  Location: [facility];  Service: Ophthalmology;  Laterality: Left;  CDE: 4.89  . 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 tubular adenomas  . COLONOSCOPY N/A 03/06/2015   EHM:[id number] coli/multiple rectal and colon polyps, tubular adenomas. Next colonoscopy April 2019.  [name] left wrist    . LITHOTRIPSY  2010  . [place]   right      reports that he quit smoking about 5 years ago. He started  smoking about 54 years ago. He smoked 0.50 packs per day. He has never used smokeless tobacco. He reports that he does not drink alcohol or use drugs.  Allergies  Allergen Reactions  . Metformin And Related Diarrhea    Cannot tolerate    Family History  Problem Relation Age of Onset  . Diabetes Sister     Prior to Admission medications   Medication Sig Start Date End Date Taking? Authorizing Provider  albuterol (PROVENTIL HFA;VENTOLIN HFA) 108 (90 Base) MCG/ACT inhaler Inhale 2 puffs into the lungs  as directed. 12/07/14  Yes [provider]  ambrisentan (LETAIRIS) 5 MG tablet Take 10 mg by mouth daily.    Yes [provider]  amLODipine (NORVASC) 5 MG tablet TAKE 1 TABLET BY MOUTH EVERY DAY 06/02/18  Yes Fayrene Helper, MD  atorvastatin (LIPITOR) 10 MG tablet TAKE 1 TABLET BY MOUTH EVERY DAY 04/07/18  Yes Fayrene Helper, MD  azelastine (ASTELIN) 0.1 % nasal spray Place 2 sprays into both nostrils 2 (two) times daily. Use in each nostril as directed 09/12/17  Yes Fayrene Helper, MD  cetirizine (ZYRTEC) 10 MG tablet Take 10 mg by mouth daily. 01/27/18  Yes [provider]  hydrOXYzine (ATARAX/VISTARIL) 10 MG tablet One tablet at bedtime for itching, as needed 11/16/18 04/16/19 Yes Fayrene Helper, MD  potassium chloride (K-DUR) 10 MEQ tablet Take two tablets once daily with furosemide 07/28/18  Yes Fayrene Helper, MD  Tadalafil, PAH, (ADCIRCA) 20 MG TABS Take 2 tablets by mouth daily.   Yes [provider]  torsemide (DEMADEX) 20 MG tablet Take 1 tablet (20 mg total) by mouth 2 (two) times daily. 11/18/18 02/16/19 Yes BranchAlphonse Guild, MD  Treprostinil Diolamine ER 2.5 MG TBCR Inhale 2.5 mg into the lungs 4 (four) times daily.   Yes [provider]    Physical Exam: Vitals:   11/20/18 2694 11/20/18 0924 11/20/18 1000 11/20/18 1100  BP:  135/86 133/83 116/67  Pulse:  92 90 79  Resp:  _0 Temp:  97.7 F (36.5 C)    TempSrc:  Oral    SpO2:  91% 95% 96%  Weight: 107.5 kg     Height: _1  (1.905 m)       Constitutional: He is in no apparent distress, breathing a little bit hard Eyes: PERRL, lids and conjunctivae normal ENMT: Mucous membranes are moist. Neck: normal, supple Respiratory: Bibasilar crackles present, no wheezing heard.  Increased respiratory effort, tachypneic Cardiovascular: Regular rate and rhythm, no significant murmurs appreciated, 2+ pitting lower extremity edema, + JVD Abdomen: no tenderness, no masses  palpated. Bowel sounds positive.  Musculoskeletal: no clubbing / cyanosis. Normal muscle tone.  Skin: no rashes, lesions, ulcers. No induration Neurologic: CN 2-12 grossly intact. Strength 5/5 in all 4.  Psychiatric: Normal judgment and insight. Alert and oriented x 3. Normal mood.   Labs on Admission: I have personally reviewed following labs and imaging studies  CBC: Recent Labs  Lab 11/20/18 0936  WBC 6.0  NEUTROABS 3.6  HGB 15.5  HCT 49.0  MCV 93.3  PLT 854*   Basic Metabolic Panel: Recent Labs  Lab 11/20/18 0936  NA 137  K 4.9  CL 107  CO2 22  GLUCOSE 93  BUN 20  CREATININE 1.39*  CALCIUM 8.8*   GFR: Estimated Creatinine Clearance: 62.7 mL/min (A) (by C-G formula based on SCr of 1.39 mg/dL (H)). Liver Function Tests: Recent Labs  Lab 11/20/18 [phone]  AST 32  ALT 12  ALKPHOS 62  BILITOT 2.6*  PROT 7.7  ALBUMIN 3.5   No results for input(s): LIPASE, AMYLASE in the last 168 hours. No results for input(s): AMMONIA in the last 168 hours. Coagulation Profile: No results for input(s): INR, PROTIME in the last 168 hours. Cardiac Enzymes: Recent Labs  Lab 11/20/18 0936  TROPONINI <0.03   BNP (last 3 results) No results for input(s): PROBNP in the last 8760 hours. HbA1C: No results for input(s): HGBA1C in the last 72 hours. CBG: No results for input(s): GLUCAP in the last 168 hours. Lipid Profile: No results for input(s): CHOL, HDL, LDLCALC, TRIG, CHOLHDL, LDLDIRECT in the last 72 hours. Thyroid Function Tests: No results for input(s): TSH, T4TOTAL, FREET4, T3FREE, THYROIDAB in the last 72 hours. Anemia Panel: No results for input(s): VITAMINB12, FOLATE, FERRITIN, TIBC, IRON, RETICCTPCT in the last 72 hours. Urine analysis:    Component Value Date/Time   COLORURINE YELLOW 12/14/2008 2045   APPEARANCEUR CLEAR 12/14/2008 2045   LABSPEC 1.025 12/14/2008 2045   PHURINE 7.0 12/14/2008 2045   GLUCOSEU NEG mg/dL 12/14/2008 2045   HGBUR LARGE (A) 11/07/2007  0735   BILIRUBINUR neg 05/22/2015 0908   KETONESUR NEG mg/dL 12/14/2008 2045   PROTEINUR 100 05/22/2015 0908   PROTEINUR NEG mg/dL 12/14/2008 2045   UROBILINOGEN 1.0 05/22/2015 0908   UROBILINOGEN 2 (H) 12/14/2008 2045   NITRITE neg 05/22/2015 0908   NITRITE NEG 12/14/2008 2045   LEUKOCYTESUR Negative 05/22/2015 0908     Radiological Exams on Admission: Dg Chest 2 View  Result Date: 11/20/2018 CLINICAL DATA:  Nonproductive cough, dyspnea. EXAM: CHEST - 2 VIEW COMPARISON:  CT scan of February 12, 2016. Radiographs of December 06, 2014. FINDINGS: Stable cardiomediastinal silhouette is noted. No pneumothorax is noted. Mild bibasilar subsegmental atelectasis is noted. Minimal pleural effusions may be present. Bony thorax is unremarkable. IMPRESSION: Mild bibasilar subsegmental atelectasis with minimal pleural effusions. Electronically Signed   By: Marijo Conception, M.D.   On: 11/20/2018 10:28    EKG: Independently reviewed.  Sinus rhythm  Assessment/Plan Principal Problem:   Acute on chronic diastolic CHF (congestive heart failure) (HCC) Active Problems:   Hyperlipidemia with target LDL less than 100   Essential hypertension   Diabetic neuropathy (HCC)   Type 2 diabetes mellitus with hyperlipidemia (HCC)   CAD in native artery   Pulmonary hypertension (HCC)   Edema   CKD (chronic kidney disease) stage 2, GFR 60-89 ml/min   Principal Problem Acute on chronic diastolic CHF with anasarca -Patient with significant evidence of fluid overload, not responding to home oral diuretics, will admit to telemetry, IV Lasix 60 twice daily, daily weights, strict ins and outs -Updated 2D echo since it has been a while -Cardiology consulted -There is a component of dietary noncompliance, up until he started getting significantly fluid overloaded he did not pay attention to what he was eating.  Active Problems Severe pulmonary hypertension acute on chronic hypoxic respiratory failure -Continue home  medications, diuresis and monitor respiratory status.  Wean down oxygen to 4 L which is his baseline, currently on 6 L  Chronic kidney disease stage II -Baseline creatinine 1.1-1.2, currently 1.39 on admission.  Closely monitor while getting IV diuresis  Type 2 diabetes mellitus, diet controlled -Most recent A1c shows poorly controlled at 8.1, placed on sliding scale.  He is not on any diabetic medications that I could see  Hypertension -Hold Norvasc as he is getting diuresed  Coronary artery disease -  No chest pain, stable  Thrombocytopenia -Somewhat chronic dating back to 2018, stable, no bleeding   DVT prophylaxis: Lovenox  Code Status: Full code  Family Communication: family at bedside  Disposition Plan: home when ready  Consults called: cardiology     Marzetta Board, MD, PhD [facility]  Contact via [email or web address]  [facility] Office Info P: [phone]  F: [phone]   11/20/2018, 11:59 AM

## 2018-11-20 NOTE — ED Notes (Signed)
Care assumed. Pt laying on stretcher in NAD with visitor x 1 to bedside. Pt denies pain or discomfort at this  Time. Even and unlabored respirations observed. Pt perfusing well with lasix on board. No new complaints or concerns verbalized. Cardiology consult pending.

## 2018-11-21 ENCOUNTER — Observation Stay (HOSPITAL_COMMUNITY): Payer: Medicare Other

## 2018-11-21 DIAGNOSIS — I2781 Cor pulmonale (chronic): Secondary | ICD-10-CM | POA: Diagnosis present

## 2018-11-21 DIAGNOSIS — Z9981 Dependence on supplemental oxygen: Secondary | ICD-10-CM | POA: Diagnosis not present

## 2018-11-21 DIAGNOSIS — Z833 Family history of diabetes mellitus: Secondary | ICD-10-CM | POA: Diagnosis not present

## 2018-11-21 DIAGNOSIS — D696 Thrombocytopenia, unspecified: Secondary | ICD-10-CM | POA: Diagnosis present

## 2018-11-21 DIAGNOSIS — J302 Other seasonal allergic rhinitis: Secondary | ICD-10-CM | POA: Diagnosis present

## 2018-11-21 DIAGNOSIS — R609 Edema, unspecified: Secondary | ICD-10-CM | POA: Diagnosis not present

## 2018-11-21 DIAGNOSIS — Z79899 Other long term (current) drug therapy: Secondary | ICD-10-CM | POA: Diagnosis not present

## 2018-11-21 DIAGNOSIS — J9811 Atelectasis: Secondary | ICD-10-CM | POA: Diagnosis present

## 2018-11-21 DIAGNOSIS — I10 Essential (primary) hypertension: Secondary | ICD-10-CM | POA: Diagnosis not present

## 2018-11-21 DIAGNOSIS — I130 Hypertensive heart and chronic kidney disease with heart failure and stage 1 through stage 4 chronic kidney disease, or unspecified chronic kidney disease: Secondary | ICD-10-CM | POA: Diagnosis present

## 2018-11-21 DIAGNOSIS — J449 Chronic obstructive pulmonary disease, unspecified: Secondary | ICD-10-CM | POA: Diagnosis present

## 2018-11-21 DIAGNOSIS — E1122 Type 2 diabetes mellitus with diabetic chronic kidney disease: Secondary | ICD-10-CM | POA: Diagnosis present

## 2018-11-21 DIAGNOSIS — E1140 Type 2 diabetes mellitus with diabetic neuropathy, unspecified: Secondary | ICD-10-CM | POA: Diagnosis present

## 2018-11-21 DIAGNOSIS — E1151 Type 2 diabetes mellitus with diabetic peripheral angiopathy without gangrene: Secondary | ICD-10-CM | POA: Diagnosis present

## 2018-11-21 DIAGNOSIS — I5033 Acute on chronic diastolic (congestive) heart failure: Secondary | ICD-10-CM

## 2018-11-21 DIAGNOSIS — E785 Hyperlipidemia, unspecified: Secondary | ICD-10-CM | POA: Diagnosis present

## 2018-11-21 DIAGNOSIS — N182 Chronic kidney disease, stage 2 (mild): Secondary | ICD-10-CM | POA: Diagnosis not present

## 2018-11-21 DIAGNOSIS — Z888 Allergy status to other drugs, medicaments and biological substances status: Secondary | ICD-10-CM | POA: Diagnosis not present

## 2018-11-21 DIAGNOSIS — I2510 Atherosclerotic heart disease of native coronary artery without angina pectoris: Secondary | ICD-10-CM | POA: Diagnosis present

## 2018-11-21 DIAGNOSIS — Z87891 Personal history of nicotine dependence: Secondary | ICD-10-CM | POA: Diagnosis not present

## 2018-11-21 DIAGNOSIS — N183 Chronic kidney disease, stage 3 (moderate): Secondary | ICD-10-CM | POA: Diagnosis present

## 2018-11-21 DIAGNOSIS — Z961 Presence of intraocular lens: Secondary | ICD-10-CM | POA: Diagnosis present

## 2018-11-21 DIAGNOSIS — I2729 Other secondary pulmonary hypertension: Secondary | ICD-10-CM | POA: Diagnosis present

## 2018-11-21 DIAGNOSIS — Z9841 Cataract extraction status, right eye: Secondary | ICD-10-CM | POA: Diagnosis not present

## 2018-11-21 DIAGNOSIS — J9621 Acute and chronic respiratory failure with hypoxia: Secondary | ICD-10-CM | POA: Diagnosis present

## 2018-11-21 DIAGNOSIS — Z9111 Patient's noncompliance with dietary regimen: Secondary | ICD-10-CM | POA: Diagnosis not present

## 2018-11-21 DIAGNOSIS — I509 Heart failure, unspecified: Secondary | ICD-10-CM | POA: Diagnosis present

## 2018-11-21 DIAGNOSIS — Z9842 Cataract extraction status, left eye: Secondary | ICD-10-CM | POA: Diagnosis not present

## 2018-11-21 LAB — BASIC METABOLIC PANEL
Anion gap: 9 (ref 5–15)
BUN: 22 mg/dL (ref 8–23)
CO2: 24 mmol/L (ref 22–32)
Calcium: 8.8 mg/dL — ABNORMAL LOW (ref 8.9–10.3)
Chloride: 106 mmol/L (ref 98–111)
Creatinine, Ser: 1.38 mg/dL — ABNORMAL HIGH (ref 0.61–1.24)
GFR calc Af Amer: 58 mL/min — ABNORMAL LOW (ref >60)
GFR calc non Af Amer: 50 mL/min — ABNORMAL LOW (ref >60)
Glucose, Bld: 101 mg/dL — ABNORMAL HIGH (ref 70–99)
Potassium: 3.6 mmol/L (ref 3.5–5.1)
Sodium: 139 mmol/L (ref 135–145)

## 2018-11-21 LAB — GLUCOSE, CAPILLARY
Glucose-Capillary: 94 mg/dL (ref 70–99)
Glucose-Capillary: 136 mg/dL — ABNORMAL HIGH (ref 70–99)
Glucose-Capillary: 132 mg/dL — ABNORMAL HIGH (ref 70–99)
Glucose-Capillary: 118 mg/dL — ABNORMAL HIGH (ref 70–99)

## 2018-11-21 LAB — ECHOCARDIOGRAM COMPLETE
Height: 75 in
Weight: 3495.61 oz

## 2018-11-21 MED ORDER — TREPROSTINIL DIOLAMINE ER 2.5 MG PO TBCR: 2.5000 mg | EXTENDED_RELEASE_TABLET | Freq: Four times a day (QID) | ORAL | Status: DC

## 2018-11-21 MED ORDER — LIVING BETTER WITH HEART FAILURE BOOK: Freq: Once | Status: DC

## 2018-11-21 NOTE — Progress Notes (Signed)
  Echocardiogram 2D Echocardiogram has been performed.  [name] 11/21/2018, 11:24 AM

## 2018-11-21 NOTE — Progress Notes (Signed)
PROGRESS NOTE  [name] TMA:[id number] DOB: February 01, 1946 DOA: 11/20/2018 PCP: Fayrene Helper, MD   LOS: 0 days   Brief Narrative / Interim history: [name] is a 73 y.o. male with medical history significant of diastolic CHF, pulmonary hypertension with chronic hypoxic respiratory failure on 4 L nasal cannula at home, chronic kidney disease stage II, diet-controlled diabetes mellitus, COPD, hypertension, hyperlipidemia who presents to the hospital with chief complaint of increased swelling in his legs, 26 pound weight gain and worsening shortness of breath.  He was admitted to the hospital for acute on chronic diastolic CHF and was placed on IV diuresis.  Cardiology evaluated patient in the ER.  Subjective: He states that he feels a little bit better this morning however still has significant lower extremity swelling.  He is worse than baseline and getting short of breath still with just a few steps today.  He denies any chest pain, no abdominal pain, no nausea or vomiting.  Remains quite hypoxic and was on CPAP high flow nasal cannula 10 L overnight  Assessment & Plan: Principal Problem:   Acute on chronic diastolic CHF (congestive heart failure) (HCC) Active Problems:   Hyperlipidemia with target LDL less than 100   Essential hypertension   Diabetic neuropathy (HCC)   Type 2 diabetes mellitus with hyperlipidemia (HCC)   CAD in native artery   Pulmonary hypertension (HCC)   Edema   CKD (chronic kidney disease) stage 2, GFR 60-89 ml/min   Principal Problem Acute on chronic diastolic CHF with anasarca -Patient with significant evidence of fluid overload, not responding to home oral diuretics, he was placed on IV Lasix 60 mg twice daily, net -2.7 L this morning. -Continue low-salt diet and fluid restriction.  Dietary nonadherence seems to be the major player here -2D echo pending  Active Problems Severe pulmonary hypertension acute on chronic hypoxic respiratory  failure -Continue home medications, diuresis and monitor respiratory status. -Continue to wean oxygen to baseline which is 4 L  Chronic kidney disease stage II -Baseline creatinine 1.1-1.2, 1.39 on admission, received Lasix and has remained stable, 1.38 this morning.  Continue to closely monitor  Type 2 diabetes mellitus, diet controlled -Most recent A1c shows poorly controlled at 8.1, placed on sliding scale.  He is not on any diabetic medications that I could see -Fasting this morning 94, continue current regimen  Hypertension -Hold Norvasc as he is getting diuresed, blood pressure 109/57, continue to hold Norvasc  Coronary artery disease -No chest pain, stable  Thrombocytopenia -Somewhat chronic dating back to 2018, stable, no bleeding   Scheduled Meds: . ambrisentan  10 mg Oral Daily  . atorvastatin  10 mg Oral Daily  . enoxaparin (LOVENOX) injection  40 mg Subcutaneous Q24H  . furosemide  60 mg Intravenous Q12H  . insulin aspart  0-9 Units Subcutaneous TID WC  . sodium chloride flush  3 mL Intravenous Q12H  . tadalafil (PAH)  40 mg Oral Daily   Continuous Infusions: . sodium chloride     PRN Meds:.sodium chloride, acetaminophen, albuterol, ondansetron (ZOFRAN) IV, sodium chloride flush  DVT prophylaxis: Lovenox Code Status: Full code Family Communication: no family at bedside  Disposition Plan: home when ready   Consultants:   Cardiology   Procedures:   2D echo: pending   Antimicrobials:  None    Objective: Vitals:   11/20/18 2306 11/21/18 0252 11/21/18 0341 11/21/18 0618  BP: (!) 112/56   (!) 109/57  Pulse: 85 88 88 72  Resp:  _0 Temp: 98 F (36.7 C)   (!) 97.5 F (36.4 C)  TempSrc: Oral   Axillary  SpO2: 92% 92% 90% 95%  Weight:    99.1 kg  Height:        Intake/Output Summary (Last 24 hours) at 11/21/2018 1054 Last data filed at 11/21/2018 0900 Gross per 24 hour  Intake 240 ml  Output 2750 ml  Net -2510 ml   Filed Weights     11/20/18 0923 11/21/18 0618  Weight: 107.5 kg 99.1 kg    Examination:  Constitutional: NAD Eyes: PERRL, lids and conjunctivae normal ENMT: Mucous membranes are moist.  Neck: normal, supple Respiratory: Bibasilar crackles heard, no wheezing, diminished sounds at the bases.  Slightly tachypneic on exam Cardiovascular: Regular rate and rhythm, no murmurs / rubs / gallops.  2+ pitting LE edema.  Abdomen: no tenderness. Bowel sounds positive.  Musculoskeletal: no clubbing / cyanosis. Skin: no rashes Neurologic: CN 2-12 grossly intact. Strength 5/5 in all 4.  Psychiatric: Normal judgment and insight. Alert and oriented x 3. Normal mood.    Data Reviewed: I have independently reviewed following labs and imaging studies   CBC: Recent Labs  Lab 11/20/18 0936  WBC 6.0  NEUTROABS 3.6  HGB 15.5  HCT 49.0  MCV 93.3  PLT 973*   Basic Metabolic Panel: Recent Labs  Lab 11/20/18 0936 11/21/18 0620  NA 137 139  K 4.9 3.6  CL 107 106  CO2 22 24  GLUCOSE 93 101*  BUN 20 22  CREATININE 1.39* 1.38*  CALCIUM 8.8* 8.8*   GFR: Estimated Creatinine Clearance: 57 mL/min (A) (by C-G formula based on SCr of 1.38 mg/dL (H)). Liver Function Tests: Recent Labs  Lab 11/20/18 0936  AST 32  ALT 12  ALKPHOS 62  BILITOT 2.6*  PROT 7.7  ALBUMIN 3.5   No results for input(s): LIPASE, AMYLASE in the last 168 hours. No results for input(s): AMMONIA in the last 168 hours. Coagulation Profile: No results for input(s): INR, PROTIME in the last 168 hours. Cardiac Enzymes: Recent Labs  Lab 11/20/18 0936  TROPONINI <0.03   BNP (last 3 results) No results for input(s): PROBNP in the last 8760 hours. HbA1C: No results for input(s): HGBA1C in the last 72 hours. CBG: Recent Labs  Lab 11/20/18 2132 11/21/18 0752  GLUCAP 120* 94   Lipid Profile: No results for input(s): CHOL, HDL, LDLCALC, TRIG, CHOLHDL, LDLDIRECT in the last 72 hours. Thyroid Function Tests: No results for input(s):  TSH, T4TOTAL, FREET4, T3FREE, THYROIDAB in the last 72 hours. Anemia Panel: No results for input(s): VITAMINB12, FOLATE, FERRITIN, TIBC, IRON, RETICCTPCT in the last 72 hours. Urine analysis:    Component Value Date/Time   COLORURINE YELLOW 12/14/2008 2045   APPEARANCEUR CLEAR 12/14/2008 2045   LABSPEC 1.025 12/14/2008 2045   PHURINE 7.0 12/14/2008 2045   GLUCOSEU NEG mg/dL 12/14/2008 2045   HGBUR LARGE (A) 11/07/2007 0735   BILIRUBINUR neg 05/22/2015 0908   KETONESUR NEG mg/dL 12/14/2008 2045   PROTEINUR 100 05/22/2015 0908   PROTEINUR NEG mg/dL 12/14/2008 2045   UROBILINOGEN 1.0 05/22/2015 0908   UROBILINOGEN 2 (H) 12/14/2008 2045   NITRITE neg 05/22/2015 0908   NITRITE NEG 12/14/2008 2045   LEUKOCYTESUR Negative 05/22/2015 0908   Sepsis Labs: Invalid input(s): PROCALCITONIN, LACTICIDVEN  No results found for this or any previous visit (from the past 240 hour(s)).    Radiology Studies: Dg Chest 2 View  Result Date: 11/20/2018 CLINICAL DATA:  Nonproductive cough, dyspnea. EXAM: CHEST - 2 VIEW COMPARISON:  CT scan of February 12, 2016. Radiographs of December 06, 2014. FINDINGS: Stable cardiomediastinal silhouette is noted. No pneumothorax is noted. Mild bibasilar subsegmental atelectasis is noted. Minimal pleural effusions may be present. Bony thorax is unremarkable. IMPRESSION: Mild bibasilar subsegmental atelectasis with minimal pleural effusions. Electronically Signed   By: Marijo Conception, M.D.   On: 11/20/2018 10:28    Marzetta Board, MD, PhD [facility]  Contact via  [email or web address]  [place] P: [phone]  F: [phone]

## 2018-11-21 NOTE — Progress Notes (Signed)
Patient refused to send medication to pharmacy stating that he was charged for his own medication the last time that he did this and he insists on taking his own medication.  Dr. Emeterio Reeve, pharmacist and supervisor aware.

## 2018-11-21 NOTE — Progress Notes (Signed)
Patient is refusing the use of CPAP at this time. RT informed patient if he changes his mind have RN contact RT.

## 2018-11-21 NOTE — Progress Notes (Signed)
Pt on 4L [place] and O2 reading 84%. Pt was asleep and not in distress, O2 bumped up to 5L and then 6L with O2 only bumping up to 86%. Non re-breather was placed and O2 increased to 94%. Resp was notified and they placed pt on high flow [place] at 10L. He did good for about 88mns and and O2 decreased to 87%. Pt awake and stated he wears a CPAP at night. Educated pt on importance of using urinal because I noticed there was nothing in the urinal. Pt stated he doesn't like to use it but has urinated 5-6 times since 1900. Urged patient to began using urinal so we can be sure he is have adequate output. Pt verbalized understanding and adv, he will begin to use it upon our request. MD made aware of CPAP usage at home and ordered CPAP QHS. resp made aware. Will continue to monitor patient.

## 2018-11-21 NOTE — Progress Notes (Addendum)
Have placed patient on CPAP 8 for  sleep with 10 lpm in trained. Patient normally wears 4-6 lpm at home. CPAP when he is so inclined to. He is up walking to bathroom with out problems. His saturation most likely is always low as he has no problems of being sob even though his saturation is 86.

## 2018-11-22 LAB — CBC
HCT: 44.9 % (ref 39.0–52.0)
Hemoglobin: 13.9 g/dL (ref 13.0–17.0)
MCH: 28.4 pg (ref 26.0–34.0)
MCHC: 31 g/dL (ref 30.0–36.0)
MCV: 91.6 fL (ref 80.0–100.0)
Platelets: 100 10*3/uL — ABNORMAL LOW (ref 150–400)
RBC: 4.9 MIL/uL (ref 4.22–5.81)
RDW: 15.5 % (ref 11.5–15.5)
WBC: 5.1 10*3/uL (ref 4.0–10.5)
nRBC: 0 % (ref 0.0–0.2)

## 2018-11-22 LAB — GLUCOSE, CAPILLARY
GLUCOSE-CAPILLARY: 119 mg/dL — AB (ref 70–99)
Glucose-Capillary: 113 mg/dL — ABNORMAL HIGH (ref 70–99)
Glucose-Capillary: 128 mg/dL — ABNORMAL HIGH (ref 70–99)
Glucose-Capillary: 164 mg/dL — ABNORMAL HIGH (ref 70–99)

## 2018-11-22 LAB — BASIC METABOLIC PANEL
Anion gap: 12 (ref 5–15)
BUN: 23 mg/dL (ref 8–23)
CO2: 26 mmol/L (ref 22–32)
Calcium: 8.9 mg/dL (ref 8.9–10.3)
Chloride: 99 mmol/L (ref 98–111)
Creatinine, Ser: 1.4 mg/dL — ABNORMAL HIGH (ref 0.61–1.24)
GFR calc Af Amer: 57 mL/min — ABNORMAL LOW (ref >60)
GFR calc non Af Amer: 49 mL/min — ABNORMAL LOW (ref >60)
Glucose, Bld: 100 mg/dL — ABNORMAL HIGH (ref 70–99)
POTASSIUM: 3.3 mmol/L — AB (ref 3.5–5.1)
Sodium: 137 mmol/L (ref 135–145)

## 2018-11-22 MED ORDER — POTASSIUM CHLORIDE CRYS ER 20 MEQ PO TBCR
40.0000 meq | EXTENDED_RELEASE_TABLET | Freq: Once | ORAL | Status: DC
  Filled 2018-11-22: qty 2

## 2018-11-22 NOTE — Progress Notes (Signed)
Patient continues to refuse to take the hospital's medications. MD and pharmacy aware that patient is taking his home supply of daily medications.

## 2018-11-22 NOTE — Progress Notes (Signed)
PROGRESS NOTE  [name] QQV:[id number] DOB: [birth year] DOA: 11/20/2018 PCP: Fayrene Helper, MD   LOS: 1 day   Brief Narrative / Interim history: [name] is a 73 y.o. male with medical history significant of diastolic CHF, pulmonary hypertension with chronic hypoxic respiratory failure on 4 L nasal cannula at home, chronic kidney disease stage II, diet-controlled diabetes mellitus, COPD, hypertension, hyperlipidemia who presents to the hospital with chief complaint of increased swelling in his legs, 26 pound weight gain and worsening shortness of breath.  He was admitted to the hospital for acute on chronic diastolic CHF and was placed on IV diuresis.  Cardiology evaluated patient in the ER.  Subjective: -Feels a lot better today, wants to walk a little bit more.  Still appreciates swelling and dyspnea was much improved.  No chest pain  Assessment & Plan: Principal Problem:   Acute on chronic diastolic CHF (congestive heart failure) (HCC) Active Problems:   Hyperlipidemia with target LDL less than 100   Essential hypertension   Diabetic neuropathy (HCC)   Type 2 diabetes mellitus with hyperlipidemia (HCC)   CAD in native artery   Pulmonary hypertension (HCC)   Edema   CHF (congestive heart failure) (HCC)   CKD (chronic kidney disease) stage 2, GFR 60-89 ml/min   Principal Problem Acute on chronic diastolic CHF with anasarca -Patient with significant evidence of fluid overload, not responding to home oral diuretics, he was placed on IV Lasix 60 mg twice daily, net -4.2 L.  Weight down 29 pounds in just 2 days ?Accuracy -Continue low-salt diet and fluid restriction.  Dietary nonadherence seems to be the major player here -2D echo as below, marked RV failure with signs of severe RV pressure/volume overload.  Continue diuresis  Active Problems Severe pulmonary hypertension acute on chronic hypoxic respiratory failure, cor pulmonale -Continue home medications,  diuresis and monitor respiratory status. -Continue to wean oxygen to baseline which is 4 L  Chronic kidney disease stage II -Baseline creatinine 1.1-1.2, 1.39 on admission, received Lasix and has remained stable, 1.40 this morning.  Continue to diurese and monitor.  Replete potassium today  Type 2 diabetes mellitus, diet controlled -Most recent A1c shows poorly controlled at 8.1, placed on sliding scale.  He is not on any diabetic medications that I could see -Fasting this morning 94, continue current regimen  Hypertension -Hold Norvasc as he is getting diuresed, blood pressure 109/57, continue to hold Norvasc  Coronary artery disease -No chest pain, stable  Thrombocytopenia -Somewhat chronic dating back to 2018, stable, no bleeding   Scheduled Meds: . ambrisentan  10 mg Oral Daily  . atorvastatin  10 mg Oral Daily  . enoxaparin (LOVENOX) injection  40 mg Subcutaneous Q24H  . furosemide  60 mg Intravenous Q12H  . insulin aspart  0-9 Units Subcutaneous TID WC  . Living Better with Heart Failure Book   Does not apply Once  . sodium chloride flush  3 mL Intravenous Q12H  . tadalafil (PAH)  40 mg Oral Daily   Continuous Infusions: . sodium chloride     PRN Meds:.sodium chloride, acetaminophen, albuterol, ondansetron (ZOFRAN) IV, sodium chloride flush  DVT prophylaxis: Lovenox Code Status: Full code Family Communication: no family at bedside  Disposition Plan: home when ready   Consultants:   Cardiology   Procedures:  2D echo: - There is evidence of pulmonary HTN with cor pulmonale. The RV is massively dilated and severely hypokinetic. There is septal flattening of the LV (D-sign) suggestive  of severe RV pressure/volume overload.  Antimicrobials:  None    Objective: Vitals:   11/21/18 2126 11/21/18 2232 11/22/18 0500 11/22/18 0549  BP: (!) 104/58   (!) 121/51  Pulse: 75   73  Resp: 20   19  Temp: 98.5 F (36.9 C)   98.6 F (37 C)  TempSrc: Oral   Oral  SpO2:  94% 94%  95%  Weight:   94.8 kg   Height:        Intake/Output Summary (Last 24 hours) at 11/22/2018 0947 Last data filed at 11/22/2018 0700 Gross per 24 hour  Intake 960 ml  Output 2700 ml  Net -1740 ml   Filed Weights   11/20/18 0923 11/21/18 0618 11/22/18 0500  Weight: 107.5 kg 99.1 kg 94.8 kg    Examination:  Constitutional: No distress, sitting at the edge of the bed Eyes: No scleral icterus ENMT: Mucous membranes are moist.  Neck: normal, supple Respiratory: Bibasilar crackles--significantly improved, no wheezing.  Moves air well. Cardiovascular: Regular rate and rhythm, 1+ pitting lower extremity edema Abdomen: Soft, nontender, nondistended, BS+ Musculoskeletal: no clubbing / cyanosis. Skin: No rashes seen, chronic venous stasis changes bilateral lower extremities Neurologic: No focal deficits Psychiatric: Normal judgment and insight. Alert and oriented x 3. Normal mood.    Data Reviewed: I have independently reviewed following labs and imaging studies   CBC: Recent Labs  Lab 11/20/18 0936 11/22/18 0626  WBC 6.0 5.1  NEUTROABS 3.6  --   HGB 15.5 13.9  HCT 49.0 44.9  MCV 93.3 91.6  PLT 106* 584*   Basic Metabolic Panel: Recent Labs  Lab 11/20/18 0936 11/21/18 0620 11/22/18 0626  NA 137 139 137  K 4.9 3.6 3.3*  CL 107 106 99  CO2 _0 GLUCOSE 93 101* 100*  BUN _1 CREATININE 1.39* 1.38* 1.40*  CALCIUM 8.8* 8.8* 8.9   GFR: Estimated Creatinine Clearance: 56.2 mL/min (A) (by C-G formula based on SCr of 1.4 mg/dL (H)). Liver Function Tests: Recent Labs  Lab 11/20/18 0936  AST 32  ALT 12  ALKPHOS 62  BILITOT 2.6*  PROT 7.7  ALBUMIN 3.5   No results for input(s): LIPASE, AMYLASE in the last 168 hours. No results for input(s): AMMONIA in the last 168 hours. Coagulation Profile: No results for input(s): INR, PROTIME in the last 168 hours. Cardiac Enzymes: Recent Labs  Lab 11/20/18 0936  TROPONINI <0.03   BNP (last 3  results) No results for input(s): PROBNP in the last 8760 hours. HbA1C: No results for input(s): HGBA1C in the last 72 hours. CBG: Recent Labs  Lab 11/21/18 0752 11/21/18 1147 11/21/18 1629 11/21/18 2127 11/22/18 0736  GLUCAP 94 136* 132* 118* 113*   Lipid Profile: No results for input(s): CHOL, HDL, LDLCALC, TRIG, CHOLHDL, LDLDIRECT in the last 72 hours. Thyroid Function Tests: No results for input(s): TSH, T4TOTAL, FREET4, T3FREE, THYROIDAB in the last 72 hours. Anemia Panel: No results for input(s): VITAMINB12, FOLATE, FERRITIN, TIBC, IRON, RETICCTPCT in the last 72 hours. Urine analysis:    Component Value Date/Time   COLORURINE YELLOW 12/14/2008 2045   APPEARANCEUR CLEAR 12/14/2008 2045   LABSPEC 1.025 12/14/2008 2045   PHURINE 7.0 12/14/2008 2045   GLUCOSEU NEG mg/dL 12/14/2008 2045   HGBUR LARGE (A) 11/07/2007 0735   BILIRUBINUR neg 05/22/2015 0908   KETONESUR NEG mg/dL 12/14/2008 2045   PROTEINUR 100 05/22/2015 0908   PROTEINUR NEG mg/dL 12/14/2008 2045   UROBILINOGEN 1.0 05/22/2015 0908  UROBILINOGEN 2 (H) 12/14/2008 2045   NITRITE neg 05/22/2015 0908   NITRITE NEG 12/14/2008 2045   LEUKOCYTESUR Negative 05/22/2015 0908   Sepsis Labs: Invalid input(s): PROCALCITONIN, LACTICIDVEN  No results found for this or any previous visit (from the past 240 hour(s)).    Radiology Studies: Dg Chest 2 View  Result Date: 11/20/2018 CLINICAL DATA:  Nonproductive cough, dyspnea. EXAM: CHEST - 2 VIEW COMPARISON:  CT scan of February 12, 2016. Radiographs of December 06, 2014. FINDINGS: Stable cardiomediastinal silhouette is noted. No pneumothorax is noted. Mild bibasilar subsegmental atelectasis is noted. Minimal pleural effusions may be present. Bony thorax is unremarkable. IMPRESSION: Mild bibasilar subsegmental atelectasis with minimal pleural effusions. Electronically Signed   By: Marijo Conception, M.D.   On: 11/20/2018 10:28    Marzetta Board, MD, PhD [facility]  Contact via  [email or web address]  [place] P: [phone]  F: [phone]

## 2018-11-22 NOTE — Progress Notes (Signed)
Patient refuses the use of CPAP. Patient aware to have RN contact RT if he changes his mind.

## 2018-11-23 ENCOUNTER — Ambulatory Visit (INDEPENDENT_AMBULATORY_CARE_PROVIDER_SITE_OTHER): Payer: Self-pay | Admitting: Family Medicine

## 2018-11-23 DIAGNOSIS — E1165 Type 2 diabetes mellitus with hyperglycemia: Secondary | ICD-10-CM

## 2018-11-23 DIAGNOSIS — I2510 Atherosclerotic heart disease of native coronary artery without angina pectoris: Secondary | ICD-10-CM

## 2018-11-23 LAB — BASIC METABOLIC PANEL
Anion gap: 11 (ref 5–15)
BUN: 25 mg/dL — ABNORMAL HIGH (ref 8–23)
CO2: 28 mmol/L (ref 22–32)
Calcium: 9.3 mg/dL (ref 8.9–10.3)
Chloride: 97 mmol/L — ABNORMAL LOW (ref 98–111)
Creatinine, Ser: 1.37 mg/dL — ABNORMAL HIGH (ref 0.61–1.24)
GFR calc Af Amer: 59 mL/min — ABNORMAL LOW (ref >60)
GFR calc non Af Amer: 51 mL/min — ABNORMAL LOW (ref >60)
Glucose, Bld: 104 mg/dL — ABNORMAL HIGH (ref 70–99)
Potassium: 3.6 mmol/L (ref 3.5–5.1)
Sodium: 136 mmol/L (ref 135–145)

## 2018-11-23 LAB — GLUCOSE, CAPILLARY
Glucose-Capillary: 131 mg/dL — ABNORMAL HIGH (ref 70–99)
Glucose-Capillary: 133 mg/dL — ABNORMAL HIGH (ref 70–99)

## 2018-11-23 NOTE — Care Management Note (Signed)
Case Management Note  Patient Details  Name: [name] MRN: [id number] Date of Birth: [birth year]  Subjective/Objective:       Admitted with CHF. Pt from home with spouse. Pt has insurance and PCP. Per MD pt has good health literacy and insight. He admits to dietary non-compliance. He verbalizes what to do when fluid begins to build up. He has cardiologist he see's regularly. Need for [facility] discussed with MD who feels they would not add anything to the management of the patients disease.   Action/Plan: DC home today with self care.   Expected Discharge Date:  11/23/18               Expected Discharge Plan:  Home/Self Care  In-House Referral:  NA  Discharge planning Services  CM Consult  Post Acute Care Choice:  NA Choice offered to:  NA  Status of Service:  Completed, signed off  If discussed at Long Length of Stay Meetings, dates discussed:    Additional Comments:  Sherald Barge, RN 11/23/2018, 11:02 AM

## 2018-11-23 NOTE — Progress Notes (Signed)
Patient ambulated around the unit on 6L oxygen [place], which is chronic for him. Patient tolerated well, with no obvious distress noted during ambulation. This RN made MD aware.   Celestia Khat, RN

## 2018-11-23 NOTE — Discharge Summary (Signed)
Physician Discharge Summary  [name]:[id number] DOB: [birth year] DOA: 11/20/2018  PCP: Fayrene Helper, MD  Admit date: 11/20/2018 Discharge date: 11/23/2018  Admitted From: home Disposition:  home  Recommendations for Outpatient Follow-up:  1. Follow up with cardiology as an outpatient as scheduled  Home Health: RN Equipment/Devices: Home oxygen, 4 L nasal cannula at baseline  Discharge Condition: Stable CODE STATUS: Full code Diet recommendation: Low-sodium diet, heart healthy  HPI: Per admitting MD, [name] is a 73 y.o. male with medical history significant of diastolic CHF, pulmonary hypertension with chronic hypoxic respiratory failure on 4 L nasal cannula at home, chronic kidney disease stage II, diet-controlled diabetes mellitus, COPD, hypertension, hyperlipidemia who presents to the hospital with chief complaint of increased swelling in his legs, 26 pound weight gain and worsening shortness of breath.  Patient tells me that at baseline he is short of breath and has been so for several years due to his pulmonary hypertension.  However, in the last 1 to 2 weeks, he has noticed significant leg swelling, increased weight as well as progressive shortness of breath, he is able to take 3-4 steps before becoming dyspneic and the need to stop.  He was seen by cardiology 2 days ago and at that time his diuretics were planned to be changed to torsemide however patient has not started that.  He denies any chest pains, denies any abdominal pain, no nausea or vomiting.  He denies any fever or chills.  He also has noticed that he is needed to increase his oxygen from 4 L at home to 6 L. ED Course: In the ED his vital signs are stable but he is slightly tachypneic, required 6 L of nasal cannula, his blood work shows a creatinine of 1.39, total bilirubin is 2.6, BNP is elevated to 597, his platelets are low at 106.  He was given IV Lasix, cardiology was consulted and we are asked  to admit  Hospital Course: Principal Problem Acute on chronic hypoxic respiratory failure due to acute on chronic diastolic CHF with anasarca -Patient with significant evidence of fluid overload, not responding to home oral diuretics, this is likely in the setting of dietary noncompliance as he admitted to this.  Cardiology was consulted and followed patient while hospitalized. He underwent a 2D echo which showed marked RV failure with signs of severe RV pressure/volume overload. He was placed on IV Lasix, he was net negative about 7 L, weight is improved to 208 pounds which is a little bit better than his baseline of 2/10 pounds, his respiratory status returned to baseline, he is able to ambulate in the hallway several 100 feet without shortness of breath, without chest pain and difficulties.  Discussed with cardiology on the day of discharge, he is stable to go home, his diuretic regimen has been changed to torsemide and patient will pick up his medications from the pharmacy upon discharge.  He has follow-up with cardiology as an outpatient, stressed the importance of a low-sodium diet as well as daily weights and close outpatient follow-up.  He expressed understanding.  Active Problems Severe pulmonary hypertension acute on chronic hypoxic respiratory failure, cor pulmonale -Continue home medications, on baseline oxygen 4 L nasal cannula on discharge Chronic kidney disease stage II -Baseline creatinine 1.1-1.2, creatinine has remained stable with diuresis, 1.39 on admission and 1.37 on discharge. Type 2 diabetes mellitus, diet controlled -Most recent A1c shows poorly controlled at 8.1, counseled for dietary changes Hypertension -resume home medications  Coronary artery disease -No chest pain, stable Thrombocytopenia -Somewhat chronic dating back to 2018, stable, no bleeding  Discharge Diagnoses:  Principal Problem:   Acute on chronic diastolic CHF (congestive heart failure) (HCC) Active  Problems:   Hyperlipidemia with target LDL less than 100   Essential hypertension   Diabetic neuropathy (HCC)   Type 2 diabetes mellitus with hyperlipidemia (HCC)   CAD in native artery   Pulmonary hypertension (HCC)   Edema   CHF (congestive heart failure) (HCC)   CKD (chronic kidney disease) stage 2, GFR 60-89 ml/min     Discharge Instructions   Allergies as of 11/23/2018      Reactions   Metformin And Related Diarrhea   Cannot tolerate      Medication List    TAKE these medications   ADCIRCA 20 MG tablet Generic drug:  tadalafil (PAH) Take 2 tablets by mouth daily.   albuterol 108 (90 Base) MCG/ACT inhaler Commonly known as:  PROVENTIL HFA;VENTOLIN HFA Inhale 2 puffs into the lungs as directed.   amLODipine 5 MG tablet Commonly known as:  NORVASC TAKE 1 TABLET BY MOUTH EVERY DAY   atorvastatin 10 MG tablet Commonly known as:  LIPITOR TAKE 1 TABLET BY MOUTH EVERY DAY   cetirizine 10 MG tablet Commonly known as:  ZYRTEC Take 10 mg by mouth daily as needed.   hydrOXYzine 10 MG tablet Commonly known as:  ATARAX/VISTARIL One tablet at bedtime for itching, as needed   LETAIRIS 5 MG tablet Generic drug:  ambrisentan Take 10 mg by mouth daily.   potassium chloride 10 MEQ tablet Commonly known as:  K-DUR Take two tablets once daily with furosemide   torsemide 20 MG tablet Commonly known as:  DEMADEX Take 1 tablet (20 mg total) by mouth 2 (two) times daily.   Treprostinil Diolamine ER 2.5 MG Tbcr Inhale 2.5 mg into the lungs 4 (four) times daily.      Follow-up Information    Arnoldo Lenis, MD. Schedule an appointment as soon as possible for a visit on 12/15/2018.   Specialty:  Cardiology Why:  2:00 Contact information: [address] [place] [address] [phone]        Fayrene Helper, MD. Schedule an appointment as soon as possible for a visit in 1 week.   Specialty:  Family Medicine Contact information: [address] [place] [address] [phone]        Arnoldo Lenis, MD.   Specialty:  Cardiology Contact information: [place]  [address] [phone]           Consultations:  Cardiology  Procedures/Studies: 2D echo - There is evidence of pulmonary HTN with cor pulmonale. The RV is massively dilated and severely hypokinetic. There is septal flattening of the LV (D-sign) suggestive of severe RV pressure/volume overload.   Dg Chest 2 View  Result Date: 11/20/2018 CLINICAL DATA:  Nonproductive cough, dyspnea. EXAM: CHEST - 2 VIEW COMPARISON:  CT scan of February 12, 2016. Radiographs of December 06, 2014. FINDINGS: Stable cardiomediastinal silhouette is noted. No pneumothorax is noted. Mild bibasilar subsegmental atelectasis is noted. Minimal pleural effusions may be present. Bony thorax is unremarkable. IMPRESSION: Mild bibasilar subsegmental atelectasis with minimal pleural effusions. Electronically Signed   By: Marijo Conception, M.D.   On: 11/20/2018 10:28      Subjective: - no chest pain, shortness of breath, no abdominal pain, nausea or vomiting.  Has not felt this good  in about 6 months  Discharge Exam: Vitals:   11/22/18 2117 11/23/18 0559  BP: 94/60 (!) 106/58  Pulse: 77 83  Resp: 19 20  Temp: 98.3 F (36.8 C) 98.6 F (37 C)  SpO2: 97% 93%    General: Pt is alert, awake, not in acute distress Cardiovascular: RRR, S1/S2 +, no rubs, no gallops Respiratory: CTA bilaterally, no wheezing, no rhonchi Abdominal: Soft, NT, ND, bowel sounds + Extremities: Trace lower extremity edema, no cyanosis    The results of significant diagnostics from this hospitalization (including imaging, microbiology, ancillary and laboratory) are listed below for reference.     Microbiology: No results found for this or any previous visit (from the past 240 hour(s)).   Labs: BNP (last 3 results) Recent Labs    11/20/18 0936  BNP 167.5*   Basic Metabolic  Panel: Recent Labs  Lab 11/20/18 0936 11/21/18 0620 11/22/18 0626 11/23/18 0419  NA 137 139 137 136  K 4.9 3.6 3.3* 3.6  CL 107 106 99 97*  CO2 _0 GLUCOSE 93 101* 100* 104*  BUN _1 25*  CREATININE 1.39* 1.38* 1.40* 1.37*  CALCIUM 8.8* 8.8* 8.9 9.3   Liver Function Tests: Recent Labs  Lab 11/20/18 0936  AST 32  ALT 12  ALKPHOS 62  BILITOT 2.6*  PROT 7.7  ALBUMIN 3.5   No results for input(s): LIPASE, AMYLASE in the last 168 hours. No results for input(s): AMMONIA in the last 168 hours. CBC: Recent Labs  Lab 11/20/18 0936 11/22/18 0626  WBC 6.0 5.1  NEUTROABS 3.6  --   HGB 15.5 13.9  HCT 49.0 44.9  MCV 93.3 91.6  PLT 106* 100*   Cardiac Enzymes: Recent Labs  Lab 11/20/18 0936  TROPONINI <0.03   BNP: Invalid input(s): POCBNP CBG: Recent Labs  Lab 11/22/18 1117 11/22/18 1618 11/22/18 2114 11/23/18 0738 11/23/18 1105  GLUCAP 119* 128* 164* 131* 133*   D-Dimer No results for input(s): DDIMER in the last 72 hours. Hgb A1c No results for input(s): HGBA1C in the last 72 hours. Lipid Profile No results for input(s): CHOL, HDL, LDLCALC, TRIG, CHOLHDL, LDLDIRECT in the last 72 hours. Thyroid function studies No results for input(s): TSH, T4TOTAL, T3FREE, THYROIDAB in the last 72 hours.  Invalid input(s): FREET3 Anemia work up No results for input(s): VITAMINB12, FOLATE, FERRITIN, TIBC, IRON, RETICCTPCT in the last 72 hours. Urinalysis    Component Value Date/Time   COLORURINE YELLOW 12/14/2008 2045   APPEARANCEUR CLEAR 12/14/2008 2045   LABSPEC 1.025 12/14/2008 2045   PHURINE 7.0 12/14/2008 2045   GLUCOSEU NEG mg/dL 12/14/2008 2045   HGBUR LARGE (A) 11/07/2007 0735   BILIRUBINUR neg 05/22/2015 0908   KETONESUR NEG mg/dL 12/14/2008 2045   PROTEINUR 100 05/22/2015 0908   PROTEINUR NEG mg/dL 12/14/2008 2045   UROBILINOGEN 1.0 05/22/2015 0908   UROBILINOGEN 2 (H) 12/14/2008 2045   NITRITE neg 05/22/2015 0908   NITRITE NEG  12/14/2008 2045   LEUKOCYTESUR Negative 05/22/2015 0908   Sepsis Labs Invalid input(s): PROCALCITONIN,  WBC,  LACTICIDVEN   Time coordinating discharge: 40 minutes  SIGNED:  Marzetta Board, MD  [facility] 11/23/2018, 1:43 PM

## 2018-11-23 NOTE — Progress Notes (Signed)
Patient is to be discharged home and in stable condition. Patient's IV and telemetry removed, WNL. Patient given discharge instructions and verbalized understanding. Patient to be transported out by staff via wheelchair to awaiting vehicle.  Celestia Khat, RN

## 2018-11-23 NOTE — Discharge Instructions (Signed)
CHF  Discharge instructions  DIET  Low sodium Heart Healthy Diet with fluid restriction 1500cc/day  ACTIVITY  Avoid strenuous activity  WEIGH DAILY AT SAME TIME . CALL YOUR DOCTOR IF WEIGHT INCREASES BY MORE THAN 3  POUNDS IN 2 DAYS  CALL YOUR DOCTOR OR COME TO EMERGENCY ROOM IF WORSENING SHORTNESS OF BREATH OR  CHEST PAIN OR SWELLING  F/U with PMD in 1 week to recheck labs and adjust fluid pills as needed  If you smoke cigarettes or use any tobacco products you are advised to stop. Please ask nurse for any written materials  Or additional information you want regarding smoking cessation  Follow with Fayrene Helper, MD in 5-7 days  Please get a complete blood count and chemistry panel checked by your Primary MD at your next visit, and again as instructed by your Primary MD. Please get your medications reviewed and adjusted by your Primary MD.  Please request your Primary MD to go over all Hospital Tests and Procedure/Radiological results at the follow up, please get all Hospital records sent to your Prim MD by signing hospital release before you go home.  If you had Pneumonia of Lung problems at the Hospital: Please get a 2 view Chest X ray done in 6-8 weeks after hospital discharge or sooner if instructed by your Primary MD.  If you have Congestive Heart Failure: Please call your Cardiologist or Primary MD anytime you have any of the following symptoms:  1) 3 pound weight gain in 24 hours or 5 pounds in 1 week  2) shortness of breath, with or without a dry hacking cough  3) swelling in the hands, feet or stomach  4) if you have to sleep on extra pillows at night in order to breathe  Follow cardiac low salt diet and 1.5 lit/day fluid restriction.  If you have diabetes Accuchecks 4 times/day, Once in AM empty stomach and then before each meal. Log in all results and show them to your primary doctor at your next visit. If any glucose reading is under 80 or above 300 call your  primary MD immediately.  If you have Seizure/Convulsions/Epilepsy: Please do not drive, operate heavy machinery, participate in activities at heights or participate in high speed sports until you have seen by Primary MD or a Neurologist and advised to do so again.  If you had Gastrointestinal Bleeding: Please ask your Primary MD to check a complete blood count within one week of discharge or at your next visit. Your endoscopic/colonoscopic biopsies that are pending at the time of discharge, will also need to followed by your Primary MD.  Get Medicines reviewed and adjusted. Please take all your medications with you for your next visit with your Primary MD  Please request your Primary MD to go over all hospital tests and procedure/radiological results at the follow up, please ask your Primary MD to get all Hospital records sent to his/her office.  If you experience worsening of your admission symptoms, develop shortness of breath, life threatening emergency, suicidal or homicidal thoughts you must seek medical attention immediately by calling 911 or calling your MD immediately  if symptoms less severe.  You must read complete instructions/literature along with all the possible adverse reactions/side effects for all the Medicines you take and that have been prescribed to you. Take any new Medicines after you have completely understood and accpet all the possible adverse reactions/side effects.   Do not drive or operate heavy machinery when taking Pain medications.  Do not take more than prescribed Pain, Sleep and Anxiety Medications  Special Instructions: If you have smoked or chewed Tobacco  in the last 2 yrs please stop smoking, stop any regular Alcohol  and or any Recreational drug use.  Wear Seat belts while driving.  Please note You were cared for by a hospitalist during your hospital stay. If you have any questions about your discharge medications or the care you received while you  were in the hospital after you are discharged, you can call the unit and asked to speak with the hospitalist on call if the hospitalist that took care of you is not available. Once you are discharged, your primary care physician will handle any further medical issues. Please note that NO REFILLS for any discharge medications will be authorized once you are discharged, as it is imperative that you return to your primary care physician (or establish a relationship with a primary care physician if you do not have one) for your aftercare needs so that they can reassess your need for medications and monitor your lab values.  You can reach the hospitalist office at phone [phone] or fax [phone]   If you do not have a primary care physician, you can call [phone] for a physician referral.  Activity: As tolerated with Full fall precautions use walker/cane & assistance as needed  Diet: low sodium  Disposition Home

## 2018-11-24 ENCOUNTER — Telehealth: Payer: Self-pay

## 2018-11-24 NOTE — Telephone Encounter (Signed)
Transition Care Management Follow-up Telephone Call   Date discharged?  11-23-18              How have you been since you were released from the hospital? I feel much better, able to breath better and walk better    Do you understand why you were in the hospital? Yes I do    Do you understand the discharge instructions? Yes    Where were you discharged to? Home    Items Reviewed:  Medications reviewed: Yes   Allergies reviewed: Yes   Dietary changes reviewed: Low sodium heart healthy   Referrals reviewed: Yes    Functional Questionnaire:   Activities of Daily Living (ADLs):   Able to perform    Any transportation issues/concerns?: None    Any patient concerns? Upset about appointment after leaving the hospital yesterday    Confirmed importance and date/time of follow-up visits scheduled Yes, has a follow up with doctor Moshe Cipro next week and Dr. Harl Bowie also      Confirmed with patient if condition begins to worsen call PCP or go to the ER.  Patient was given the office number and encouraged to call back with question or concerns.  :

## 2018-11-26 ENCOUNTER — Other Ambulatory Visit: Payer: Self-pay | Admitting: Family Medicine

## 2018-11-29 NOTE — Progress Notes (Signed)
t rescheduled as he had just been d/c from hospital

## 2018-12-02 ENCOUNTER — Ambulatory Visit (INDEPENDENT_AMBULATORY_CARE_PROVIDER_SITE_OTHER): Payer: Medicare Other | Admitting: Family Medicine

## 2018-12-02 ENCOUNTER — Encounter: Payer: Self-pay | Admitting: Family Medicine

## 2018-12-02 VITALS — BP 124/60 | HR 81 | Resp 14 | Ht 75.0 in | Wt 202.1 lb

## 2018-12-02 DIAGNOSIS — I5032 Chronic diastolic (congestive) heart failure: Secondary | ICD-10-CM | POA: Diagnosis not present

## 2018-12-02 DIAGNOSIS — I10 Essential (primary) hypertension: Secondary | ICD-10-CM | POA: Diagnosis not present

## 2018-12-02 DIAGNOSIS — E1169 Type 2 diabetes mellitus with other specified complication: Secondary | ICD-10-CM

## 2018-12-02 DIAGNOSIS — E785 Hyperlipidemia, unspecified: Secondary | ICD-10-CM

## 2018-12-02 DIAGNOSIS — Z09 Encounter for follow-up examination after completed treatment for conditions other than malignant neoplasm: Secondary | ICD-10-CM

## 2018-12-02 NOTE — Patient Instructions (Addendum)
F/U March 14 or shortly after, call if you need me sooner  Non fasting chem 7 and EGFR Feb 4, same day as  Cardiology visit 2 l fluid / day Weigh daily you will get [place] re heart failure No salt, limit, no canned foods   Fasting lipid cmp andf eGFr, HBA1C 3/11 or shortly after , please get 3 to 5 days  before visit   Please call Lung specialist re dose of letairis    Thank you  for choosing [facility]. We consider it a privelige to serve you.  Delivering excellent health care in a caring and  compassionate way is our goal.  Partnering with you,  so that together we can achieve this goal is our strategy.

## 2018-12-06 ENCOUNTER — Telehealth: Payer: Self-pay | Admitting: Family Medicine

## 2018-12-06 ENCOUNTER — Encounter: Payer: Self-pay | Admitting: Family Medicine

## 2018-12-06 DIAGNOSIS — Z09 Encounter for follow-up examination after completed treatment for conditions other than malignant neoplasm: Secondary | ICD-10-CM | POA: Insufficient documentation

## 2018-12-06 NOTE — Progress Notes (Signed)
   [name]     MRN: [id number]      DOB: 1946/01/15   HPI [name] is here for follow up recent hospitalization from  01/10 to 11/23/2018 with a dx of acute on chronic heart failure, he has actually diuresed 42 pounds during hospitalization, feels as though he could run a marathon!  hospital course is reviewed, medication reconciliation reveals discrepancy in medication for pulmonary HTN and he will contact the Doc The importance of daily weights  and fluid and sodium restriction is also reviewed, her will also be referred to  [facility] for assistance with heart failure All questions are answered , he is grateful for the hospitalization. Blood sugar was controlled off of any medication in the hospital ROS See HPI  Denies recent fever or chills. Denies sinus pressure, nasal congestion, ear pain or sore throat. Denies chest congestion, productive cough or wheezing. Denies chest pains, palpitations and leg swelling Denies abdominal pain, nausea, vomiting,diarrhea or constipation.   Denies dysuria, frequency, hesitancy or incontinence. Denies joint pain, swelling and limitation in mobility. Denies headaches, seizures, numbness, or tingling. Denies depression, anxiety or insomnia. Denies skin break down or rash.   PE  BP 124/60   Pulse 81   Resp 14   Ht _0  (1.905 m)   Wt 202 lb 1.9 oz (91.7 kg)   SpO2 94% Comment: w 6lpm oxygen  BMI 25.26 kg/m   Patient alert and oriented and in no cardiopulmonary distress.on supplemental oxygen at 4 liters  HEENT: No facial asymmetry, EOMI,   oropharynx pink and moist.  Neck supple no JVD, no mass.  Chest: Clear to auscultation bilaterally.decreased air entry  CVS: S1, S2 no murmurs, no S3.Regular rate.  ABD: Soft non tender.   Ext: No edema  MS: Adequate ROM spine, shoulders, hips and knees.  Skin: Intact, no ulcerations or rash noted.  Psych: Good eye contact, normal affect. Memory intact not anxious or depressed  appearing.  CNS: CN 2-12 intact, power,  normal throughout.no focal deficits noted.   [facility] discharge follow-up Patient in for follow up of recent hospitalization. Discharge summary, and laboratory and radiology data are reviewed, and any questions or concerns about recent hospitalization are discussed. Specific issues requiring follow up are specifically addressed.

## 2018-12-06 NOTE — Assessment & Plan Note (Signed)
Patient in for follow up of recent hospitalization. Discharge summary, and laboratory and radiology data are reviewed, and any questions or concerns about recent hospitalization are discussed. Specific issues requiring follow up are specifically addressed.

## 2018-12-06 NOTE — Telephone Encounter (Signed)
Please refer to [facility] for help with heart failure, recently admitted with severe heart failure

## 2018-12-07 NOTE — Telephone Encounter (Signed)
Referral sent 

## 2018-12-07 NOTE — Addendum Note (Signed)
Addended by: Eual Fines on: 12/07/2018 09:39 AM   Modules accepted: Orders

## 2018-12-15 ENCOUNTER — Ambulatory Visit: Payer: Medicare Other | Admitting: Family Medicine

## 2018-12-15 ENCOUNTER — Ambulatory Visit: Payer: Medicare Other | Admitting: Student

## 2018-12-15 ENCOUNTER — Other Ambulatory Visit (HOSPITAL_COMMUNITY)
Admission: RE | Admit: 2018-12-15 | Discharge: 2018-12-15 | Disposition: A | Discharge status: Home / Self Care | Payer: Medicare Other | Source: Ambulatory Visit | Attending: Family Medicine | Admitting: Family Medicine

## 2018-12-15 ENCOUNTER — Encounter: Payer: Self-pay | Admitting: Student

## 2018-12-15 VITALS — BP 106/56 | HR 85 | Ht 75.0 in | Wt 207.0 lb

## 2018-12-15 DIAGNOSIS — E782 Mixed hyperlipidemia: Secondary | ICD-10-CM

## 2018-12-15 DIAGNOSIS — N183 Chronic kidney disease, stage 3 unspecified: Secondary | ICD-10-CM

## 2018-12-15 DIAGNOSIS — I270 Primary pulmonary hypertension: Secondary | ICD-10-CM

## 2018-12-15 DIAGNOSIS — I2781 Cor pulmonale (chronic): Secondary | ICD-10-CM | POA: Diagnosis not present

## 2018-12-15 DIAGNOSIS — I10 Essential (primary) hypertension: Secondary | ICD-10-CM | POA: Diagnosis not present

## 2018-12-15 DIAGNOSIS — I5032 Chronic diastolic (congestive) heart failure: Secondary | ICD-10-CM

## 2018-12-15 LAB — BASIC METABOLIC PANEL
Anion gap: 9 (ref 5–15)
BUN: 26 mg/dL — ABNORMAL HIGH (ref 8–23)
CO2: 25 mmol/L (ref 22–32)
Calcium: 9.4 mg/dL (ref 8.9–10.3)
Chloride: 104 mmol/L (ref 98–111)
Creatinine, Ser: 1.28 mg/dL — ABNORMAL HIGH (ref 0.61–1.24)
GFR calc Af Amer: >60 mL/min (ref >60)
GFR calc non Af Amer: 55 mL/min — ABNORMAL LOW (ref >60)
Glucose, Bld: 148 mg/dL — ABNORMAL HIGH (ref 70–99)
Potassium: 4.3 mmol/L (ref 3.5–5.1)
Sodium: 138 mmol/L (ref 135–145)

## 2018-12-15 NOTE — Progress Notes (Signed)
Cardiology Office Note    Date:  12/15/2018   ID:  [name], [name] Apr 21, 1946, MRN [id number]  PCP:  Fayrene Helper, MD  Cardiologist: Carlyle Dolly, MD    Chief Complaint  Patient presents with  . Hospitalization Follow-up    History of Present Illness:    [name] is a 73 y.o. male with past medical history of chronic diastolic CHF, Idiopathic Pulmonary HTN, HTN, HLD, and Stage 3 CKD who presents to the office today for hospital follow-up.  He was examined by Dr. Harl Bowie on 11/18/2018 and reported a 26 pound weight gain over the past 4 months with worsening abdominal distention and lower extremity edema. He declined hospital admission at that time but did present back to the hospital on Friday for planned admission. BNP was found to be elevated to 597 and CXR showed subsegmental atelectasis with minimal pleural effusions. Repeat echocardiogram showed a preserved EF with grade 1 diastolic dysfunction but he did have severely reduced RV function and PA pressure was elevated to 72 mm Hg which was consistent with Pulmonary HTN with cor pulmonale. He was started on IV Lasix 60 mg twice daily and diuresed over 7 L with weight having declined to 208 lbs upon discharge (elevated to 237 lbs on admission). He was transitioned to Torsemide 54m BID at discharge with creatinine stable at 1.37.  In talking with the patient today, he reports overall doing well since his recent hospitalization. He was on oxygen supplementation prior to admission and says he has returned to his baseline amount of 4L at rest and 6L with exertion. Says that he is now able to perform his routine household chores without having significant dyspnea. Has been following home weights and says this has been stable between 196 - 198 lbs. At 207 lbs on office scales today.   He denies any specific orthopnea, PND, or lower extremity edema. No recent chest pain or palpitations.   Past Medical History:  Diagnosis  Date  . Asthma   . Carotid bruit   . COPD (chronic obstructive pulmonary disease) ([facility]   . Diabetes mellitus without complication ([facility]   . DOE (dyspnea on exertion)    2016  . ED (erectile dysfunction)   . History of renal calculi   . Hyperlipidemia   . Hypertension   . Hypoxia    2016  . Pulmonary hypertension ([facility]   . PVD (peripheral vascular disease) ([facility]     Past Surgical History:  Procedure Laterality Date  . CARDIAC CATHETERIZATION N/A 05/31/2015   Procedure: Right Heart Cath;  Surgeon: MSherren Mocha MD;  [facility];  Service: Cardiovascular;  Laterality: N/A;  . CATARACT EXTRACTION W/PHACO Right 05/20/2017   Procedure: CATARACT EXTRACTION PHACO AND INTRAOCULAR LENS PLACEMENT (IOC);  Surgeon: SRutherford Guys MD;  Location: [facility];  Service: Ophthalmology;  Laterality: Right;  CDE: 7.20  . CATARACT EXTRACTION W/PHACO Left 06/03/2017   Procedure: CATARACT EXTRACTION PHACO AND INTRAOCULAR LENS PLACEMENT (IOC);  Surgeon: SRutherford Guys MD;  Location: [facility];  Service: Ophthalmology;  Laterality: Left;  CDE: 4.89  . 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 tubular adenomas  . COLONOSCOPY N/A 03/06/2015   RMAU:[id number]/multiple rectal and colon polyps, tubular adenomas. Next colonoscopy April 2019.  .Marland Kitchenleft wrist    . LITHOTRIPSY  2010  . [place]  right  Current Medications: Outpatient Medications Prior to Visit  Medication Sig Dispense Refill  . albuterol (PROVENTIL HFA;VENTOLIN HFA) 108 (90 Base) MCG/ACT inhaler Inhale 2 puffs into the lungs as directed.    [name] ambrisentan (LETAIRIS) 5 MG tablet Take 10 mg by mouth daily.     [name] amLODipine (NORVASC) 5 MG tablet TAKE 1 TABLET BY MOUTH EVERY DAY 30 tablet 5  . atorvastatin (LIPITOR) 10 MG tablet TAKE 1 TABLET BY MOUTH EVERY DAY 90 tablet 0  . cetirizine (ZYRTEC) 10 MG  tablet Take 10 mg by mouth daily as needed.   11  . hydrOXYzine (ATARAX/VISTARIL) 10 MG tablet One tablet at bedtime for itching, as needed 30 tablet 5  . potassium chloride (K-DUR) 10 MEQ tablet Take two tablets once daily with furosemide 60 tablet 5  . Tadalafil, PAH, (ADCIRCA) 20 MG TABS Take 2 tablets by mouth daily.    [name] torsemide (DEMADEX) 20 MG tablet Take 1 tablet (20 mg total) by mouth 2 (two) times daily. 60 tablet 6  . Treprostinil Diolamine ER 2.5 MG TBCR Inhale 2.5 mg into the lungs 4 (four) times daily.     No facility-administered medications prior to visit.      Allergies:   Metformin and related   Social History   Socioeconomic History  . Marital status: Married    Spouse name: Not on file  . Number of children: Not on file  . Years of education: Not on file  . Highest education level: Not on file  Occupational History  . Not on file  Social Needs  . Financial resource strain: Not hard at all  . Food insecurity:    Worry: Never true    Inability: Never true  . Transportation needs:    Medical: No    Non-medical: No  Tobacco Use  . Smoking status: Former Smoker    Packs/day: 0.50    Start date: 05/04/1964    Last attempt to quit: 02/09/2013    Years since quitting: 5.8  . Smokeless tobacco: Never Used  Substance and Sexual Activity  . Alcohol use: No    Alcohol/week: 0.0 standard drinks  . Drug use: No  . Sexual activity: Not on file  Lifestyle  . Physical activity:    Days per week: 0 days    Minutes per session: 0 min  . Stress: Not at all  Relationships  . Social connections:    Talks on phone: More than three times a week    Gets together: Once a week    Attends religious service: More than 4 times per year    Active member of club or organization: Yes    Attends meetings of clubs or organizations: More than 4 times per year    Relationship status: Married  Other Topics Concern  . Not on file  Social History Narrative  . Not on file      Family History:  The patient's family history includes Diabetes in his sister.   Review of Systems:   Please see the history of present illness.     General:  No chills, fever, night sweats or weight changes.  Cardiovascular:  No chest pain, edema, orthopnea, palpitations, paroxysmal nocturnal dyspnea. Positive for dyspnea on exertion (at baseline).  Dermatological: No rash, lesions/masses Respiratory: No cough, dyspnea Urologic: No hematuria, dysuria Abdominal:   No nausea, vomiting, diarrhea, bright red blood per rectum, melena, or hematemesis Neurologic:  No visual changes, wkns, changes in mental status. All other systems  reviewed and are otherwise negative except as noted above.   Physical Exam:    VS:  BP (!) 106/56   Pulse 85   Ht _0  (1.905 m)   Wt 207 lb (93.9 kg)   SpO2 94%   BMI 25.87 kg/m    General: Well developed, well nourished,male appearing in no acute distress. Head: Normocephalic, atraumatic, sclera non-icteric, no xanthomas, nares are without discharge.  Neck: No carotid bruits. JVD not elevated.  Lungs: Respirations regular and unlabored, without wheezes or rales. On 6L [place].  Heart: Regular rate and rhythm. No S3 or S4.  No murmur, no rubs, or gallops appreciated. Abdomen: Soft, non-tender, non-distended with normoactive bowel sounds. No hepatomegaly. No rebound/guarding. No obvious abdominal masses. Msk:  Strength and tone appear normal for age. No joint deformities or effusions. Extremities: No clubbing or cyanosis. Trace ankle edema bilaterally.  Distal pedal pulses are 2+ bilaterally. Neuro: Alert and oriented X 3. Moves all extremities spontaneously. No focal deficits noted. Psych:  Responds to questions appropriately with a normal affect. Skin: No rashes or lesions noted  Wt Readings from Last 3 Encounters:  12/15/18 207 lb (93.9 kg)  12/02/18 202 lb 1.9 oz (91.7 kg)  11/22/18 208 lb 14.4 oz (94.8 kg)     Studies/Labs Reviewed:   EKG:  EKG  is not ordered today.  Recent Labs: 07/24/2018: TSH 4.27 11/20/2018: ALT 12; B Natriuretic Peptide 597.0 11/22/2018: Hemoglobin 13.9; Platelets 100 12/15/2018: BUN 26; Creatinine, Ser 1.28; Potassium 4.3; Sodium 138   Lipid Panel    Component Value Date/Time   CHOL 123 07/24/2018 1006   TRIG 33 07/24/2018 1006   HDL 41 07/24/2018 1006   CHOLHDL 3.0 07/24/2018 1006   VLDL 9 06/30/2017 0832   LDLCALC 73 07/24/2018 1006    Additional studies/ records that were reviewed today include:   Echocardiogram: 11/21/2018 Study Conclusions  - Left ventricle: The cavity size was normal. Wall thickness was   normal. Doppler parameters are consistent with abnormal left   ventricular relaxation (grade 1 diastolic dysfunction). - Ventricular septum: The contour showed diastolic flattening and   systolic flattening. - Left atrium: The atrium was mildly dilated. - Right ventricle: The cavity size was severely dilated. Systolic   function was severely reduced. - Right atrium: The atrium was massively dilated. - Pulmonary arteries: PA peak pressure: 72 mm Hg (S). - Line: A venous catheter was visualized in the superior vena cava,   with its tip in the right atrium. No abnormal features noted. - Pericardium, extracardiac: A small pericardial effusion was   identified posterior to the heart. - Impressions: There is evidence of pulmonary HTN with cor   pulmonale. The RV is massively dilated and severely hypokinetic.   There is septal flattening of the LV (D-sign) suggestive of   severe RV pressure/volume overload.  Impressions:  - There is evidence of pulmonary HTN with cor pulmonale. The RV is   massively dilated and severely hypokinetic. There is septal   flattening of the LV (D-sign) suggestive of severe RV   pressure/volume overload.   Assessment:    1. Chronic diastolic (congestive) heart failure ([place])   2. Idiopathic pulmonary hypertension ([place])   3. Cor pulmonale (chronic) (HCC)    4. Essential hypertension   5. Mixed hyperlipidemia   6. CKD (chronic kidney disease) stage 3, GFR 30-59 ml/min (HCC)      Plan:   In order of problems listed above:  1. Chronic Diastolic CHF - Was  recently admitted for an acute CHF exacerbation and lost over 29 pounds during admission.  Says that weight has been stable at 196 - 198 lbs on his home scales. Has baseline dyspnea on exertion with chronic O2 use but says his respiratory status has improved since his hospitalization. Denies any recent orthopnea, PND, edema or abdominal distension.  - was switched from Lasix to Torsemide 58m BID during admission and creatinine remains stable at 1.28 and K+ at 4.3. Will continue current regimen at this time. Sodium and fluid restriction reviewed. He is scheduled to meet with a Nutritionist next week.  2. Idiopathic Pulmonary HTN/ Cor Pulmonale - recent echocardiogram showed severely reduced RV function and PA pressure was elevated to 72 mm Hg which was consistent with Pulmonary HTN with cor pulmonale.  - [facility], Tadalafil, and Treprostinil.  3. HTN - BP is well controlled at 106/56 during today's visit. Continue Amlodipine 5 mg daily.  4. HLD - Followed by PCP. FLP in 07/2018 showed total cholesterol 123, HDL 41, triglycerides 33, and LDL 73. He remains on Lipitor 10 mg daily.  5. Stage 3 CKD - Baseline creatinine 1.2 - 1.3. Rechecked today and stable at 1.28.    Medication Adjustments/Labs and Tests Ordered: Current medicines are reviewed at length with the patient today.  Concerns regarding medicines are outlined above.  Medication changes, Labs and Tests ordered today are listed in the Patient Instructions below. Patient Instructions   Medication Instructions:  Your physician recommends that you continue on your current medications as directed. Please refer to the Current Medication list given to you today.  If you need a refill on  your cardiac medications before your next appointment, please call your pharmacy.   Lab work: NONE   If you have labs (blood work) drawn today and your tests are completely normal, you will receive your results only by: .Marland KitchenMyChart Message (if you have MyChart) OR . A paper copy in the mail If you have any lab test that is abnormal or we need to change your treatment, we will call you to review the results.  Testing/Procedures: NONE   Follow-Up: [facility] you and your health needs are our priority.  As part of our continuing mission to provide you with exceptional heart care, we have created designated Provider Care Teams.  These Care Teams include your primary Cardiologist (physician) and Advanced Practice Providers (APPs -  Physician Assistants and Nurse Practitioners) who all work together to provide you with the care you need, when you need it. You will need a follow up appointment in 2-3 months.  Please call our office 2 months in advance to schedule this appointment.  You may see BCarlyle Dolly MD or one of the following Advanced Practice Providers on your designated Care Team:   BBernerd Pho PA-C ([facility] . MErmalinda Barrios PA-C ([facility]  Any Other Special Instructions Will Be Listed Below (If Applicable). Thank you for choosing [facility]   Signed, BErma Heritage PA-C  12/15/2018 5:09 PM    CBurtonS. M[address]RVandling Forestdale [address]Phone: [phone]: [phone]

## 2018-12-15 NOTE — Patient Instructions (Addendum)
Medication Instructions:  Your physician recommends that you continue on your current medications as directed. Please refer to the Current Medication list given to you today.  If you need a refill on your cardiac medications before your next appointment, please call your pharmacy.   Lab work: NONE   If you have labs (blood work) drawn today and your tests are completely normal, you will receive your results only by: [name] MyChart Message (if you have MyChart) OR . A paper copy in the mail If you have any lab test that is abnormal or we need to change your treatment, we will call you to review the results.  Testing/Procedures: NONE   Follow-Up: At [facility], you and your health needs are our priority.  As part of our continuing mission to provide you with exceptional heart care, we have created designated Provider Care Teams.  These Care Teams include your primary Cardiologist (physician) and Advanced Practice Providers (APPs -  Physician Assistants and Nurse Practitioners) who all work together to provide you with the care you need, when you need it. You will need a follow up appointment in 2-3 months.  Please call our office 2 months in advance to schedule this appointment.  You may see Carlyle Dolly, MD or one of the following Advanced Practice Providers on your designated Care Team:   Bernerd Pho, PA-C [facility]) . Ermalinda Barrios, PA-C ([place])  Any Other Special Instructions Will Be Listed Below (If Applicable). Thank you for choosing [place]!     Two Gram Sodium Diet 2000 mg  What is Sodium? Sodium is a mineral found naturally in many foods. The most significant source of sodium in the diet is table salt, which is about 40% sodium.  Processed, convenience, and preserved foods also contain a large amount of sodium.  The body needs only 500 mg of sodium daily to function,  A normal diet provides more than enough sodium even if you do not use  salt.  Why Limit Sodium? A build up of sodium in the body can cause thirst, increased blood pressure, shortness of breath, and water retention.  Decreasing sodium in the diet can reduce edema and risk of heart attack or stroke associated with high blood pressure.  Keep in mind that there are many other factors involved in these health problems.  Heredity, obesity, lack of exercise, cigarette smoking, stress and what you eat all play a role.  General Guidelines:  Do not add salt at the table or in cooking.  One teaspoon of salt contains over 2 grams of sodium.  Read food labels  Avoid processed and convenience foods  Ask your dietitian before eating any foods not dicussed in the menu planning guidelines  Consult your physician if you wish to use a salt substitute or a sodium containing medication such as antacids.  Limit milk and milk products to 16 oz (2 cups) per day.  Shopping Hints:  READ LABELS!! "Dietetic" does not necessarily mean low sodium.  Salt and other sodium ingredients are often added to foods during processing.   Menu Planning Guidelines Food Group Choose More Often Avoid  Beverages (see also the milk group All fruit juices, low-sodium, salt-free vegetables juices, low-sodium carbonated beverages Regular vegetable or tomato juices, commercially softened water used for drinking or cooking  Breads and Cereals Enriched white, wheat, rye and pumpernickel bread, hard rolls and dinner rolls; muffins, cornbread and waffles; most dry cereals, cooked cereal without added salt; unsalted crackers and  breadsticks; low sodium or homemade bread crumbs Bread, rolls and crackers with salted tops; quick breads; instant hot cereals; pancakes; commercial bread stuffing; self-rising flower and biscuit mixes; regular bread crumbs or cracker crumbs  Desserts and Sweets Desserts and sweets mad with mild should be within allowance Instant pudding mixes and cake mixes  Fats Butter or margarine;  vegetable oils; unsalted salad dressings, regular salad dressings limited to 1 Tbs; light, sour and heavy cream Regular salad dressings containing bacon fat, bacon bits, and salt pork; snack dips made with instant soup mixes or processed cheese; salted nuts  Fruits Most fresh, frozen and canned fruits Fruits processed with salt or sodium-containing ingredient (some dried fruits are processed with sodium sulfites        Vegetables Fresh, frozen vegetables and low- sodium canned vegetables Regular canned vegetables, sauerkraut, pickled vegetables, and others prepared in brine; frozen vegetables in sauces; vegetables seasoned with ham, bacon or salt pork  Condiments, Sauces, Miscellaneous  Salt substitute with physician's approval; pepper, herbs, spices; vinegar, lemon or lime juice; hot pepper sauce; garlic powder, onion powder, low sodium soy sauce (1 Tbs.); low sodium condiments (ketchup, chili sauce, mustard) in limited amounts (1 tsp.) fresh ground horseradish; unsalted tortilla chips, pretzels, potato chips, popcorn, salsa (1/4 cup) Any seasoning made with salt including garlic salt, celery salt, onion salt, and seasoned salt; sea salt, rock salt, kosher salt; meat tenderizers; monosodium glutamate; mustard, regular soy sauce, barbecue, sauce, chili sauce, teriyaki sauce, steak sauce, Worcestershire sauce, and most flavored vinegars; canned gravy and mixes; regular condiments; salted snack foods, olives, picles, relish, horseradish sauce, catsup   Food preparation: Try these seasonings Meats:    Pork Sage, onion Serve with applesauce  Chicken Poultry seasoning, thyme, parsley Serve with cranberry sauce  Lamb Curry powder, rosemary, garlic, thyme Serve with mint sauce or jelly  Veal Marjoram, basil Serve with current jelly, cranberry sauce  Beef Pepper, bay leaf Serve with dry mustard, unsalted chive butter  Fish [place] leaf, dill Serve with unsalted lemon butter, unsalted parsley butter   Vegetables:    Asparagus Lemon juice   Broccoli Lemon juice   Carrots Mustard dressing parsley, mint, nutmeg, glazed with unsalted butter and sugar   Green beans Marjoram, lemon juice, nutmeg,dill seed   Tomatoes Basil, marjoram, onion   Spice /blend for [employer]" 4 tsp ground thyme 1 tsp ground sage 3 tsp ground rosemary 4 tsp ground marjoram   Test your knowledge 1. A product that says "Salt Free" may still contain sodium. True or False 2. Garlic Powder and Hot Pepper Sauce an be used as alternative seasonings.True or False 3. Processed foods have more sodium than fresh foods.  True or False 4. Canned Vegetables have less sodium than froze True or False  WAYS TO DECREASE YOUR SODIUM INTAKE 1. Avoid the use of added salt in cooking and at the table.  Table salt (and other prepared seasonings which contain salt) is probably one of the greatest sources of sodium in the diet.  Unsalted foods can gain flavor from the sweet, sour, and butter taste sensations of herbs and spices.  Instead of using salt for seasoning, try the following seasonings with the foods listed.  Remember: how you use them to enhance natural food flavors is limited only by your creativity... Allspice-Meat, fish, eggs, fruit, peas, red and yellow vegetables Almond Extract-Fruit baked goods Anise Seed-Sweet breads, fruit, carrots, beets, cottage cheese, cookies (tastes like licorice) Basil-Meat, fish, eggs, vegetables, rice, vegetables salads, soups,  sauces Bay Leaf-Meat, fish, stews, poultry Burnet-Salad, vegetables (cucumber-like flavor) Caraway Seed-Bread, cookies, cottage cheese, meat, vegetables, cheese, rice Cardamon-Baked goods, fruit, soups Celery Powder or seed-Salads, salad dressings, sauces, meatloaf, soup, bread.Do not use  celery salt Chervil-Meats, salads, fish, eggs, vegetables, cottage cheese (parsley-like flavor) Chili Power-Meatloaf, chicken cheese, corn, eggplant, egg dishes Chives-Salads cottage  cheese, egg dishes, soups, vegetables, sauces Cilantro-Salsa, casseroles Cinnamon-Baked goods, fruit, pork, lamb, chicken, carrots Cloves-Fruit, baked goods, fish, pot roast, green beans, beets, carrots Coriander-Pastry, cookies, meat, salads, cheese (lemon-orange flavor) Cumin-Meatloaf, fish,cheese, eggs, cabbage,fruit pie (caraway flavor) [employer], fruit, eggs, fish, poultry, cottage cheese, vegetables Dill Seed-Meat, cottage cheese, poultry, vegetables, fish, salads, bread Fennel Seed-Bread, cookies, apples, pork, eggs, fish, beets, cabbage, cheese, Licorice-like flavor Garlic-(buds or powder) Salads, meat, poultry, fish, bread, butter, vegetables, potatoes.Do not  use garlic salt Ginger-Fruit, vegetables, baked goods, meat, fish, poultry Horseradish Root-Meet, vegetables, butter Lemon Juice or Extract-Vegetables, fruit, tea, baked goods, fish salads Mace-Baked goods fruit, vegetables, fish, poultry (taste like nutmeg) Maple Extract-Syrups Marjoram-Meat, chicken, fish, vegetables, breads, green salads (taste like Sage) Mint-Tea, lamb, sherbet, vegetables, desserts, carrots, cabbage Mustard, Dry or Seed-Cheese, eggs, meats, vegetables, poultry Nutmeg-Baked goods, fruit, chicken, eggs, vegetables, desserts Onion Powder-Meat, fish, poultry, vegetables, cheese, eggs, bread, rice salads (Do not use   Onion salt) Orange Extract-Desserts, baked goods Oregano-Pasta, eggs, cheese, onions, pork, lamb, fish, chicken, vegetables, green salads Paprika-Meat, fish, poultry, eggs, cheese, vegetables Parsley Flakes-Butter, vegetables, meat fish, poultry, eggs, bread, salads (certain forms may   Contain sodium Pepper-Meat fish, poultry, vegetables, eggs Peppermint Extract-Desserts, baked goods Poppy Seed-Eggs, bread, cheese, fruit dressings, baked goods, noodles, vegetables, cottage  [employer], poultry, meat, fish, cauliflower, turnips,eggs  bread Saffron-Rice, bread, veal, chicken, fish, eggs Sage-Meat, fish, poultry, onions, eggplant, tomateos, pork, stews Savory-Eggs, salads, poultry, meat, rice, vegetables, soups, pork Tarragon-Meat, poultry, fish, eggs, butter, vegetables (licorice-like flavor)  Thyme-Meat, poultry, fish, eggs, vegetables, (clover-like flavor), sauces, soups Tumeric-Salads, butter, eggs, fish, rice, vegetables (saffron-like flavor) Vanilla Extract-Baked goods, candy Vinegar-Salads, vegetables, meat marinades Walnut Extract-baked goods, candy  2. Choose your Foods Wisely   The following is a list of foods to avoid which are high in sodium:  Meats-Avoid all smoked, canned, salt cured, dried and kosher meat and fish as well as Anchovies   [employer] meats:Bologna, Liverwurst, Pastrami Canned meat or fish  Marinated herring Caviar    Pepperoni Corned Beef   Pizza Dried chipped beef  Salami Frozen breaded fish or meat Salt pork Frankfurters or hot dogs  Sardines Gefilte fish   Sausage Ham (boiled ham, Proscuitto Smoked butt    spiced ham)   Spam      TV Dinners Vegetables Canned vegetables (Regular) Relish Canned mushrooms  Sauerkraut Olives    Tomato juice Pickles  Bakery and Dessert Products Canned puddings  Cream pies Cheesecake   Decorated cakes Cookies  Beverages/Juices Tomato juice, regular  Gatorade   V-8 vegetable juice, regular  Breads and Cereals Biscuit mixes   Salted potato chips, corn chips, pretzels Bread stuffing mixes  Salted crackers and rolls Pancake and waffle mixes Self-rising flour  Seasonings Accent    Meat sauces Barbecue sauce  Meat tenderizer Catsup    Monosodium glutamate (MSG) Celery salt   Onion salt Chili sauce   Prepared mustard Garlic salt   Salt, seasoned salt, sea salt Gravy mixes   Soy sauce Horseradish   Steak sauce Ketchup   Tartar sauce Lite salt    Teriyaki  sauce Marinade mixes   Worcestershire sauce  Others Baking  powder   Cocoa and cocoa mixes Baking soda   Commercial casserole mixes Candy-caramels, chocolate  Dehydrated soups    Bars, fudge,nougats  Instant rice and pasta mixes Canned broth or soup  Maraschino cherries Cheese, aged and processed cheese and cheese spreads

## 2018-12-22 LAB — HM DIABETES EYE EXAM

## 2019-01-01 ENCOUNTER — Other Ambulatory Visit: Payer: Self-pay | Admitting: Family Medicine

## 2019-01-28 ENCOUNTER — Ambulatory Visit: Payer: Medicare Other | Admitting: Family Medicine

## 2019-02-17 ENCOUNTER — Other Ambulatory Visit: Payer: Self-pay | Admitting: Family Medicine

## 2019-03-08 ENCOUNTER — Ambulatory Visit (INDEPENDENT_AMBULATORY_CARE_PROVIDER_SITE_OTHER): Payer: Medicare Other | Admitting: Family Medicine

## 2019-03-08 ENCOUNTER — Other Ambulatory Visit: Payer: Self-pay

## 2019-03-08 ENCOUNTER — Encounter: Payer: Self-pay | Admitting: Family Medicine

## 2019-03-08 VITALS — BP 116/70 | Ht 75.0 in | Wt 207.0 lb

## 2019-03-08 DIAGNOSIS — I10 Essential (primary) hypertension: Secondary | ICD-10-CM

## 2019-03-08 DIAGNOSIS — E1169 Type 2 diabetes mellitus with other specified complication: Secondary | ICD-10-CM | POA: Diagnosis not present

## 2019-03-08 DIAGNOSIS — E785 Hyperlipidemia, unspecified: Secondary | ICD-10-CM

## 2019-03-08 DIAGNOSIS — N182 Chronic kidney disease, stage 2 (mild): Secondary | ICD-10-CM

## 2019-03-08 DIAGNOSIS — I272 Pulmonary hypertension, unspecified: Secondary | ICD-10-CM | POA: Diagnosis not present

## 2019-03-08 DIAGNOSIS — I5032 Chronic diastolic (congestive) heart failure: Secondary | ICD-10-CM | POA: Diagnosis not present

## 2019-03-08 NOTE — Patient Instructions (Addendum)
Wellness due In June or after, please schedule with Nurse Practioner   Annual physical exam with MD in early October, call if you need me before    Please get fasting labs already ordered in  January by the end of May or early June  Please continue to follow  Diabetic diet , and to remain as active as you are able to be, also continue testing blood sugar a since you report using insulin sporadically  Please DO weight every day , so that you can keep ahead of your heart failure and have good control  b e careful not to fall, either inside or outside of your home and practice good home safety habits, good lighting and not much clutter, if any!  Thanks for choosing [facility], we consider it a privelige to serve you.  [name]

## 2019-03-08 NOTE — Progress Notes (Signed)
Virtual Visit via Telephone Note  I connected with [name] on 03/08/19 at  2:00 PM EDT by telephone and verified that I am speaking with the correct person using two identifiers.   I discussed the limitations, risks, security and privacy concerns of performing an evaluation and management service by telephone and the availability of in person appointments. I also discussed with the patient that there may be a patient responsible charge related to this service. The patient expressed understanding and agreed to proceed. Pt is in his home and I am in the office   History of Present Illness: Fasting blood sugar between 125 and 144 Denies polyuria, polydipsia, blurred vision , or hypoglycemic episodes. Denies recent fever or chills. Denies sinus pressure, nasal congestion, ear pain or sore throat. Denies chest congestion, productive cough or wheezing.Chronic dyspnea ,  Relies on supplemental oxygen Denies chest pains, palpitations and leg swelling Denies abdominal pain, nausea, vomiting,diarrhea or constipation.   Denies dysuria, frequency, hesitancy or incontinence. Denies uncontrolled  joint pain, swelling and limitation in mobility. Denies headaches, seizures, numbness, or tingling. Denies depression, anxiety or insomnia. Denies skin break down or rash.       Observations/Objective: BP 116/70   Ht _0  (1.905 m)   Wt 207 lb (93.9 kg)   BMI 25.87 kg/m  Good communication with no confusion and intact memory. Alert and oriented x 3 No signs of respiratory distress during sppech    Assessment and Plan: Type 2 diabetes mellitus with hyperlipidemia [facility]) [name] is reminded of the importance of commitment to daily physical activity for 30 minutes or more, as able and the need to limit carbohydrate intake to 30 to 60 grams per meal to help with blood sugar control.   The need to take medication as prescribed, test blood sugar as directed, and to call between visits if  there is a concern that blood sugar is uncontrolled is also discussed.   [name] is reminded of the importance of daily foot exam, annual eye examination, and good blood sugar, blood pressure and cholesterol control. Uncontrolled and not at goal, reports intermittent use of insulin, this despite recommendation of daily use of 7 units lantus  Diabetic Labs Latest Ref Rng & Units 12/15/2018 11/23/2018 11/22/2018 11/21/2018 11/20/2018  HbA1c <5.7 % of total Hgb - - - - -  Microalbumin mg/dL - - - - -  Micro/Creat Ratio <30 mcg/mg creat - - - - -  Chol <200 mg/dL - - - - -  HDL >40 mg/dL - - - - -  Calc LDL mg/dL (calc) - - - - -  Triglycerides <150 mg/dL - - - - -  Creatinine 0.61 - 1.24 mg/dL 1.28(H) 1.37(H) 1.40(H) 1.38(H) 1.39(H)   BP/Weight 03/08/2019 12/15/2018 12/02/2018 11/23/2018 11/22/2018 02/16/2499 01/16/487  Systolic BP 891 694 503 888 - 280 034  Diastolic BP 70 56 60 58 - 83 68  Wt. (Lbs) 207 207 202.12 - 208.9 237.4 237.08  BMI 25.87 25.87 25.26 - 26.11 29.67 29.63   Foot/eye exam completion dates Latest Ref Rng & Units 12/22/2018 07/02/2017  Eye Exam No Retinopathy No Retinopathy -  Foot Form Completion - - Done        Pulmonary hypertension (HCC) Stable on current medication, followed by Pulmonary, no report of scute decompensation  Essential hypertension Controlled, no change in medication DASH diet and commitment to daily physical activity for a minimum of 30 minutes discussed and encouraged, as a part of hypertension management.  The importance of attaining a healthy weight is also discussed.  BP/Weight 03/08/2019 12/15/2018 12/02/2018 11/23/2018 11/22/2018 05/16/2830 03/12/7615  Systolic BP 073 710 626 948 - 546 270  Diastolic BP 70 56 60 58 - 83 68  Wt. (Lbs) 207 207 202.12 - 208.9 237.4 237.08  BMI 25.87 25.87 25.26 - 26.11 29.67 29.63       CHF (congestive heart failure) (HCC) Reports stable weight, denies leg swelling ,and reports current stability. Encouraged to  continue aggressive home management of his heart failure  CKD (chronic kidney disease) stage 2, GFR 60-89 ml/min Needs to improve blood sugar control and avoid all NSAIDS  Hyperlipidemia with target LDL less than 100 Hyperlipidemia:Low fat diet discussed and encouraged.   Lipid Panel  Lab Results  Component Value Date   CHOL 123 07/24/2018   HDL 41 07/24/2018   LDLCALC 73 07/24/2018   TRIG 33 07/24/2018   CHOLHDL 3.0 07/24/2018     Updated lab needed at/ before next visit.     Follow Up Instructions:    I discussed the assessment and treatment plan with the patient. The patient was provided an opportunity to ask questions and all were answered. The patient agreed with the plan and demonstrated an understanding of the instructions.   The patient was advised to call back or seek an in-person evaluation if the symptoms worsen or if the condition fails to improve as anticipated.  I provided 22 minutes of non-face-to-face time during this encounter.   Tula Nakayama, MD

## 2019-03-13 NOTE — Assessment & Plan Note (Signed)
Stable on current medication, followed by Pulmonary, no report of scute decompensation

## 2019-03-13 NOTE — Assessment & Plan Note (Signed)
Reports stable weight, denies leg swelling ,and reports current stability. Encouraged to continue aggressive home management of his heart failure

## 2019-03-13 NOTE — Assessment & Plan Note (Signed)
Needs to improve blood sugar control and avoid all NSAIDS

## 2019-03-13 NOTE — Assessment & Plan Note (Signed)
Hyperlipidemia:Low fat diet discussed and encouraged.   Lipid Panel  Lab Results  Component Value Date   CHOL 123 07/24/2018   HDL 41 07/24/2018   LDLCALC 73 07/24/2018   TRIG 33 07/24/2018   CHOLHDL 3.0 07/24/2018     Updated lab needed at/ before next visit.

## 2019-03-13 NOTE — Assessment & Plan Note (Addendum)
[name] is reminded of the importance of commitment to daily physical activity for 30 minutes or more, as able and the need to limit carbohydrate intake to 30 to 60 grams per meal to help with blood sugar control.   The need to take medication as prescribed, test blood sugar as directed, and to call between visits if there is a concern that blood sugar is uncontrolled is also discussed.   [name] is reminded of the importance of daily foot exam, annual eye examination, and good blood sugar, blood pressure and cholesterol control. Uncontrolled and not at goal, reports intermittent use of insulin, this despite recommendation of daily use of 7 units lantus  Diabetic Labs Latest Ref Rng & Units 12/15/2018 11/23/2018 11/22/2018 11/21/2018 11/20/2018  HbA1c <5.7 % of total Hgb - - - - -  Microalbumin mg/dL - - - - -  Micro/Creat Ratio <30 mcg/mg creat - - - - -  Chol <200 mg/dL - - - - -  HDL >40 mg/dL - - - - -  Calc LDL mg/dL (calc) - - - - -  Triglycerides <150 mg/dL - - - - -  Creatinine 0.61 - 1.24 mg/dL 1.28(H) 1.37(H) 1.40(H) 1.38(H) 1.39(H)   BP/Weight 03/08/2019 12/15/2018 12/02/2018 11/23/2018 11/22/2018 5/0/0938 11/19/2991  Systolic BP 716 967 893 810 - 175 102  Diastolic BP 70 56 60 58 - 83 68  Wt. (Lbs) 207 207 202.12 - 208.9 237.4 237.08  BMI 25.87 25.87 25.26 - 26.11 29.67 29.63   Foot/eye exam completion dates Latest Ref Rng & Units 12/22/2018 07/02/2017  Eye Exam No Retinopathy No Retinopathy -  Foot Form Completion - - Done

## 2019-03-13 NOTE — Assessment & Plan Note (Signed)
Controlled, no change in medication DASH diet and commitment to daily physical activity for a minimum of 30 minutes discussed and encouraged, as a part of hypertension management. The importance of attaining a healthy weight is also discussed.  BP/Weight 03/08/2019 12/15/2018 12/02/2018 11/23/2018 11/22/2018 04/19/4502 06/19/8279  Systolic BP 034 917 915 056 - 979 480  Diastolic BP 70 56 60 58 - 83 68  Wt. (Lbs) 207 207 202.12 - 208.9 237.4 237.08  BMI 25.87 25.87 25.26 - 26.11 29.67 29.63

## 2019-03-19 ENCOUNTER — Telehealth: Payer: Self-pay

## 2019-03-19 NOTE — Telephone Encounter (Signed)
Pt agrees to phone visit. Will have vitals/meds ready prior.      Virtual Visit Pre-Appointment Phone Call  "(Name), I am calling you today to discuss your upcoming appointment. We are currently trying to limit exposure to the virus that causes COVID-19 by seeing patients at home rather than in the office."  1. "What is the BEST phone number to call the day of the visit?" - include this in appointment notes  2. "Do you have or have access to (through a family member/friend) a smartphone with video capability that we can use for your visit?" a. If yes - list this number in appt notes as "cell" (if different from BEST phone #) and list the appointment type as a VIDEO visit in appointment notes b. If no - list the appointment type as a PHONE visit in appointment notes  3. Confirm consent - "In the setting of the current Covid19 crisis, you are scheduled for a (phone or video) visit with your provider on (date) at (time).  Just as we do with many in-office visits, in order for you to participate in this visit, we must obtain consent.  If you'd like, I can send this to your mychart (if signed up) or email for you to review.  Otherwise, I can obtain your verbal consent now.  All virtual visits are billed to your insurance company just like a normal visit would be.  By agreeing to a virtual visit, we'd like you to understand that the technology does not allow for your provider to perform an examination, and thus may limit your provider's ability to fully assess your condition. If your provider identifies any concerns that need to be evaluated in person, we will make arrangements to do so.  Finally, though the technology is pretty good, we cannot assure that it will always work on either your or our end, and in the setting of a video visit, we may have to convert it to a phone-only visit.  In either situation, we cannot ensure that we have a secure connection.  Are you willing to proceed?" STAFF: Did the  patient verbally acknowledge consent to telehealth visit? Document YES/NO here: yes  4. Advise patient to be prepared - "Two hours prior to your appointment, go ahead and check your blood pressure, pulse, oxygen saturation, and your weight (if you have the equipment to check those) and write them all down. When your visit starts, your provider will ask you for this information. If you have an Apple Watch or Kardia device, please plan to have heart rate information ready on the day of your appointment. Please have a pen and paper handy nearby the day of the visit as well."  5. Give patient instructions for MyChart download to smartphone OR Doximity/Doxy.me as below if video visit (depending on what platform provider is using)  6. Inform patient they will receive a phone call 15 minutes prior to their appointment time (may be from unknown caller ID) so they should be prepared to answer    TELEPHONE CALL NOTE  [name] has been deemed a candidate for a follow-up tele-health visit to limit community exposure during the Covid-19 pandemic. I spoke with the patient via phone to ensure availability of phone/video source, confirm preferred email & phone number, and discuss instructions and expectations.  I reminded [name] to be prepared with any vital sign and/or heart rhythm information that could potentially be obtained via home monitoring, at the time of his  visit. I reminded [name] to expect a phone call prior to his visit.  Drema Dallas, [place] 03/19/2019 10:29 AM   INSTRUCTIONS FOR DOWNLOADING THE MYCHART APP TO SMARTPHONE  - The patient must first make sure to have activated MyChart and know their login information - If Apple, go to [employer] and type in MyChart in the search bar and download the app. If Android, ask patient to go to [employer] and type in [place] in the search bar and download the app. The app is free but as with any other app downloads, their  phone may require them to verify saved payment information or Apple/Android password.  - The patient will need to then log into the app with their MyChart username and password, and select [facility] as their healthcare provider to link the account. When it is time for your visit, go to the MyChart app, find appointments, and click Begin Video Visit. Be sure to Select Allow for your device to access the Microphone and Camera for your visit. You will then be connected, and your provider will be with you shortly.  **If they have any issues connecting, or need assistance please contact MyChart service desk [phone])**  **If using a computer, in order to ensure the best quality for their visit they will need to use either of the following Internet Browsers: [employer], or Google Chrome**  IF USING DOXIMITY or DOXY.ME - The patient will receive a link just prior to their visit by text.     FULL LENGTH CONSENT FOR TELE-HEALTH VISIT   I hereby voluntarily request, consent and authorize [facility] and its employed or contracted physicians, physician assistants, nurse practitioners or other licensed health care professionals (the Practitioner), to provide me with telemedicine health care services (the "Services") as deemed necessary by the treating Practitioner. I acknowledge and consent to receive the Services by the Practitioner via telemedicine. I understand that the telemedicine visit will involve communicating with the Practitioner through live audiovisual communication technology and the disclosure of certain medical information by electronic transmission. I acknowledge that I have been given the opportunity to request an in-person assessment or other available alternative prior to the telemedicine visit and am voluntarily participating in the telemedicine visit.  I understand that I have the right to withhold or withdraw my consent to the use of telemedicine in the course of  my care at any time, without affecting my right to future care or treatment, and that the Practitioner or I may terminate the telemedicine visit at any time. I understand that I have the right to inspect all information obtained and/or recorded in the course of the telemedicine visit and may receive copies of available information for a reasonable fee.  I understand that some of the potential risks of receiving the Services via telemedicine include:  [name] Delay or interruption in medical evaluation due to technological equipment failure or disruption; . Information transmitted may not be sufficient (e.g. poor resolution of images) to allow for appropriate medical decision making by the Practitioner; and/or  . In rare instances, security protocols could fail, causing a breach of personal health information.  Furthermore, I acknowledge that it is my responsibility to provide information about my medical history, conditions and care that is complete and accurate to the best of my ability. I acknowledge that Practitioner's advice, recommendations, and/or decision may be based on factors not within their control, such as incomplete or inaccurate data provided by me  or distortions of diagnostic images or specimens that may result from electronic transmissions. I understand that the practice of medicine is not an exact science and that Practitioner makes no warranties or guarantees regarding treatment outcomes. I acknowledge that I will receive a copy of this consent concurrently upon execution via email to the email address I last provided but may also request a printed copy by calling the office of [facility].    I understand that my insurance will be billed for this visit.   I have read or had this consent read to me. . I understand the contents of this consent, which adequately explains the benefits and risks of the Services being provided via telemedicine.  . I have been provided ample opportunity to ask  questions regarding this consent and the Services and have had my questions answered to my satisfaction. . I give my informed consent for the services to be provided through the use of telemedicine in my medical care  By participating in this telemedicine visit I agree to the above.

## 2019-03-23 ENCOUNTER — Ambulatory Visit: Payer: Medicare Other | Admitting: Family Medicine

## 2019-03-24 ENCOUNTER — Encounter: Payer: Self-pay | Admitting: Family Medicine

## 2019-03-24 ENCOUNTER — Telehealth (INDEPENDENT_AMBULATORY_CARE_PROVIDER_SITE_OTHER): Payer: Medicare Other | Admitting: Cardiology

## 2019-03-24 ENCOUNTER — Ambulatory Visit (INDEPENDENT_AMBULATORY_CARE_PROVIDER_SITE_OTHER): Payer: Medicare Other | Admitting: Family Medicine

## 2019-03-24 ENCOUNTER — Encounter: Payer: Self-pay | Admitting: Cardiology

## 2019-03-24 VITALS — BP 116/68 | HR 84 | Wt 210.0 lb

## 2019-03-24 VITALS — BP 116/70 | Ht 75.0 in | Wt 210.0 lb

## 2019-03-24 DIAGNOSIS — Z0000 Encounter for general adult medical examination without abnormal findings: Secondary | ICD-10-CM

## 2019-03-24 DIAGNOSIS — I5032 Chronic diastolic (congestive) heart failure: Secondary | ICD-10-CM | POA: Diagnosis not present

## 2019-03-24 DIAGNOSIS — I2781 Cor pulmonale (chronic): Secondary | ICD-10-CM

## 2019-03-24 DIAGNOSIS — I270 Primary pulmonary hypertension: Secondary | ICD-10-CM

## 2019-03-24 MED ORDER — BLOOD GLUCOSE METER KIT: PACK | 0 refills | Status: DC

## 2019-03-24 NOTE — Patient Instructions (Addendum)
[name] , Thank you for taking time to come for your Medicare Wellness Visit. I appreciate your ongoing commitment to your health goals. Please review the following plan we discussed and let me know if I can assist you in the future.   Screening recommendations/referrals: Colonoscopy: up to date, due again 2026 Recommended yearly ophthalmology/optometry visit for glaucoma screening and checkup Recommended yearly dental visit for hygiene and checkup  Vaccinations: Influenza vaccine:Due Fall 2020 Pneumococcal vaccine: up to date Tdap vaccine: up to date  Advanced directives: Discussed with Dr Moshe Cipro  Next appointment: 08/17/2019 at 1pm annual wellness visit   You also have some labs in the system from last visit with Dr Moshe Cipro. These are to be fasting, if you can get them at beginning of June that would be great.    Preventive Care 55 Years and Older, Male Preventive care refers to lifestyle choices and visits with your health care provider that can promote health and wellness. What does preventive care include?  A yearly physical exam. This is also called an annual well check.  Dental exams once or twice a year.  Routine eye exams. Ask your health care provider how often you should have your eyes checked.  Personal lifestyle choices, including:  Daily care of your teeth and gums.  Regular physical activity.  Eating a healthy diet.  Avoiding tobacco and drug use.  Limiting alcohol use.  Practicing safe sex.  Taking low doses of aspirin every day.  Taking vitamin and mineral supplements as recommended by your health care provider. What happens during an annual well check? The services and screenings done by your health care provider during your annual well check will depend on your age, overall health, lifestyle risk factors, and family history of disease. Counseling  Your health care provider may ask you questions about your:  Alcohol use.  Tobacco use.   Drug use.  Emotional well-being.  Home and relationship well-being.  Sexual activity.  Eating habits.  History of falls.  Memory and ability to understand (cognition).  Work and work Statistician. Screening  You may have the following tests or measurements:  Height, weight, and BMI.  Blood pressure.  Lipid and cholesterol levels. These may be checked every 5 years, or more frequently if you are over 27 years old.  Skin check.  Lung cancer screening. You may have this screening every year starting at age 59 if you have a 30-pack-year history of smoking and currently smoke or have quit within the past 15 years.  Fecal occult blood test (FOBT) of the stool. You may have this test every year starting at age 71.  Flexible sigmoidoscopy or colonoscopy. You may have a sigmoidoscopy every 5 years or a colonoscopy every 10 years starting at age 90.  Prostate cancer screening. Recommendations will vary depending on your family history and other risks.  Hepatitis C blood test.  Hepatitis B blood test.  Sexually transmitted disease (STD) testing.  Diabetes screening. This is done by checking your blood sugar (glucose) after you have not eaten for a while (fasting). You may have this done every 1-3 years.  Abdominal aortic aneurysm (AAA) screening. You may need this if you are a current or former smoker.  Osteoporosis. You may be screened starting at age 31 if you are at high risk. Talk with your health care provider about your test results, treatment options, and if necessary, the need for more tests. Vaccines  Your health care provider may recommend certain vaccines, such  as:  Influenza vaccine. This is recommended every year.  Tetanus, diphtheria, and acellular pertussis (Tdap, Td) vaccine. You may need a Td booster every 10 years.  Zoster vaccine. You may need this after age 65.  Pneumococcal 13-valent conjugate (PCV13) vaccine. One dose is recommended after age 66.   Pneumococcal polysaccharide (PPSV23) vaccine. One dose is recommended after age 4. Talk to your health care provider about which screenings and vaccines you need and how often you need them. This information is not intended to replace advice given to you by your health care provider. Make sure you discuss any questions you have with your health care provider. Document Released: 11/24/2015 Document Revised: 07/17/2016 Document Reviewed: 08/29/2015 Elsevier Interactive Patient Education  2017 [place] Prevention in the Home Falls can cause injuries. They can happen to people of all ages. There are many things you can do to make your home safe and to help prevent falls. What can I do on the outside of my home?  Regularly fix the edges of walkways and driveways and fix any cracks.  Remove anything that might make you trip as you walk through a door, such as a raised step or threshold.  Trim any bushes or trees on the path to your home.  Use bright outdoor lighting.  Clear any walking paths of anything that might make someone trip, such as rocks or tools.  Regularly check to see if handrails are loose or broken. Make sure that both sides of any steps have handrails.  Any raised decks and porches should have guardrails on the edges.  Have any leaves, snow, or ice cleared regularly.  Use sand or salt on walking paths during winter.  Clean up any spills in your garage right away. This includes oil or grease spills. What can I do in the bathroom?  Use night lights.  Install grab bars by the toilet and in the tub and shower. Do not use towel bars as grab bars.  Use non-skid mats or decals in the tub or shower.  If you need to sit down in the shower, use a plastic, non-slip stool.  Keep the floor dry. Clean up any water that spills on the floor as soon as it happens.  Remove soap buildup in the tub or shower regularly.  Attach bath mats securely with double-sided non-slip  rug tape.  Do not have throw rugs and other things on the floor that can make you trip. What can I do in the bedroom?  Use night lights.  Make sure that you have a light by your bed that is easy to reach.  Do not use any sheets or blankets that are too big for your bed. They should not hang down onto the floor.  Have a firm chair that has side arms. You can use this for support while you get dressed.  Do not have throw rugs and other things on the floor that can make you trip. What can I do in the kitchen?  Clean up any spills right away.  Avoid walking on wet floors.  Keep items that you use a lot in easy-to-reach places.  If you need to reach something above you, use a strong step stool that has a grab bar.  Keep electrical cords out of the way.  Do not use floor polish or wax that makes floors slippery. If you must use wax, use non-skid floor wax.  Do not have throw rugs and other things on the  floor that can make you trip. What can I do with my stairs?  Do not leave any items on the stairs.  Make sure that there are handrails on both sides of the stairs and use them. Fix handrails that are broken or loose. Make sure that handrails are as long as the stairways.  Check any carpeting to make sure that it is firmly attached to the stairs. Fix any carpet that is loose or worn.  Avoid having throw rugs at the top or bottom of the stairs. If you do have throw rugs, attach them to the floor with carpet tape.  Make sure that you have a light switch at the top of the stairs and the bottom of the stairs. If you do not have them, ask someone to add them for you. What else can I do to help prevent falls?  Wear shoes that:  Do not have high heels.  Have rubber bottoms.  Are comfortable and fit you well.  Are closed at the toe. Do not wear sandals.  If you use a stepladder:  Make sure that it is fully opened. Do not climb a closed stepladder.  Make sure that both sides of  the stepladder are locked into place.  Ask someone to hold it for you, if possible.  Clearly mark and make sure that you can see:  Any grab bars or handrails.  First and last steps.  Where the edge of each step is.  Use tools that help you move around (mobility aids) if they are needed. These include:  Canes.  Walkers.  Scooters.  Crutches.  Turn on the lights when you go into a dark area. Replace any light bulbs as soon as they burn out.  Set up your furniture so you have a clear path. Avoid moving your furniture around.  If any of your floors are uneven, fix them.  If there are any pets around you, be aware of where they are.  Review your medicines with your doctor. Some medicines can make you feel dizzy. This can increase your chance of falling. Ask your doctor what other things that you can do to help prevent falls. This information is not intended to replace advice given to you by your health care provider. Make sure you discuss any questions you have with your health care provider. Document Released: 08/24/2009 Document Revised: 04/04/2016 Document Reviewed: 12/02/2014 Elsevier Interactive Patient Education  2017 [employer].

## 2019-03-24 NOTE — Progress Notes (Signed)

## 2019-03-24 NOTE — Progress Notes (Signed)
Virtual Visit via Telephone Note   This visit type was conducted due to national recommendations for restrictions regarding the COVID-19 Pandemic (e.g. social distancing) in an effort to limit this patient's exposure and mitigate transmission in our community.  Due to his co-morbid illnesses, this patient is at least at moderate risk for complications without adequate follow up.  This format is felt to be most appropriate for this patient at this time.  The patient did not have access to video technology/had technical difficulties with video requiring transitioning to audio format only (telephone).  All issues noted in this document were discussed and addressed.  No physical exam could be performed with this format.  Please refer to the patient's chart for his  consent to telehealth for [facility].   Date:  03/24/2019   ID:  [name], DOB [birth year], MRN [id number]  Patient Location: Home Provider Location: Home  PCP:  Fayrene Helper, MD  Cardiologist:  Carlyle Dolly, MD  Electrophysiologist:  None   Evaluation Performed:  Follow-Up Visit  Chief Complaint:  3 month follow up  History of Present Illness:    [name] is a 73 y.o. male seen today for follow up of the following medical problems.     1. Pulmonary HTN - RHC showed Mean PA 45, PCWP 5, CI 2.  - followed at [facility] for idiopathic pulmonary HTN. On ambrisentan, tadalafil, treprostinil  - seen at [facility] 01/2019. Recs to continue current therapy.   2. Chronic diastolic HF - seen by pcp with worsening hypoxia, weight gain. 211lb in 07/2018, up to 237 lbs in January.  - increased LE edema, abdominal distension. Reports some high sodium at home. Severe DOE with just a few steps until Thanksigiving. Home O2 up to 6L from 4 L.  - compliant with meds. Lasix increased to 87m tid, had been once daily. Feels like some mild improvement however weights have not decreased.    - admission Jan 2020 with volume  overload.  - diuresed 7L, weight down to 208 lbs.Changed to oral toresmide.  Jan 2020 echo LVEF not reported, grade I diasotlic dysfunction,  severely dilated RV, severe RV dysfunction, PASP 72 - at 12/2018 f/u weight stable at 207 lbs. 02/2019 visit with pcp weight 207 lbs  - home weights around 210 lbs. No significant edema.     The patient does not have symptoms concerning for COVID-19 infection (fever, chills, cough, or new shortness of breath).    Past Medical History:  Diagnosis Date  . Asthma   . Carotid bruit   . COPD (ch[place] obstructive pulmonary disease) ([place]   . Diabetes mellitus without complication ([place]   . DOE (dyspnea on exertion)    2016  . ED (erectile dysfunction)   . History of renal calculi   . Hyperlipidemia   . Hypertension   . Hypoxia    2016  . Pulmonary hypertension ([place]   . PVD [place]peripheral vascular disease) ([place]    Past Surgical History:  Procedure Laterality Date  . CARDIAC CATHETERIZATION N/A 05/31/2015   Procedure: Right Heart Cath;  Surgeon: MSherren Mocha MD;  [facility];  Service: Cardiovascular;  Laterality: N/A;  . CATARACT EXTRACTION W/PHACO Right 05/20/2017   Procedure: CATARACT EXTRACTION PHACO AND INTRAOCULAR LENS PLACEMENT (IOC);  Surgeon: SRutherford Guys MD;  Location: [facility];  Service: Ophthalmology;  Laterality: Right;  CDE: 7.20  . CATARACT EXTRACTION W/PHACO Left 06/03/2017   Procedure: CATARACT EXTRACTION PHACO AND INTRAOCULAR  LENS PLACEMENT (IOC);  Surgeon: Rutherford Guys, MD;  Location: [facility];  Service: Ophthalmology;  Laterality: Left;  CDE: 4.89  . 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 tubular adenomas  . COLONOSCOPY N/A 03/06/2015   JSH:FWYOVZCHY coli/multiple rectal and colon polyps, tubular adenomas. Next colonoscopy April 2019.  [name] left wrist    . LITHOTRIPSY  2010  . [place]    right      Current Meds  Medication Sig  . albuterol (PROVENTIL HFA;VENTOLIN HFA) 108 (90 Base) MCG/ACT inhaler Inhale 2 puffs into the lungs as directed.  [name] ambrisentan (LETAIRIS) 5 MG tablet Take 10 mg by mouth daily.   [name] amLODipine (NORVASC) 5 MG tablet TAKE 1 TABLET BY MOUTH EVERY DAY  . atorvastatin (LIPITOR) 10 MG tablet TAKE 1 TABLET BY MOUTH EVERY DAY  . blood glucose meter kit and supplies Dispense based on patient and insurance preference. Three times daily testing dx e11.65  . cetirizine (ZYRTEC) 10 MG tablet Take 10 mg by mouth daily as needed.   . hydrOXYzine (ATARAX/VISTARIL) 10 MG tablet One tablet at bedtime for itching, as needed  . potassium chloride (K-DUR) 10 MEQ tablet TAKE TWO TABLETS ONCE DAILY WITH FUROSEMIDE  . Tadalafil, PAH, (ADCIRCA) 20 MG TABS Take 2 tablets by mouth daily.  [name] torsemide (DEMADEX) 20 MG tablet Take 1 tablet (20 mg total) by mouth 2 (two) times daily.  . Treprostinil Diolamine ER 2.5 MG TBCR Inhale 2.5 mg into the lungs 4 (four) times daily.     Allergies:   Metformin and related   Social History   Tobacco Use  . Smoking status: Former Smoker    Packs/day: 0.50    Start date: 05/04/1964    Last attempt to quit: 02/09/2013    Years since quitting: 6.1  . Smokeless tobacco: Never Used  Substance Use Topics  . Alcohol use: No    Alcohol/week: 0.0 standard drinks  . Drug use: No     Family Hx: The patient's family history includes Diabetes in his sister.  ROS:   Please see the history of present illness.     All other systems reviewed and are negative.   Prior CV studies:   The following studies were reviewed today:  Jan 2016 echo Study Conclusions  - Left ventricle: The cavity size was normal. Wall thickness was increased increased in a pattern of mild to moderate LVH. Systolic function was normal. The estimated ejection fraction was in the range of 60% to 65%. Wall motion was normal; there were no regional wall  motion abnormalities. Doppler parameters are consistent with abnormal left ventricular relaxation (grade 1 diastolic dysfunction). - Aortic valve: Mildly calcified annulus. Trileaflet; mildly thickened leaflets. - Mitral valve: Mildly calcified annulus. Mildly thickened leaflets . - Left atrium: The atrium was mildly dilated. - Right ventricle: The cavity size was mildly to moderately dilated. The RV shares the apex with the LV. Systolic function was normal. RV TAPSE is 1.7 cm. - Right atrium: The atrium was moderately dilated. - Pulmonary arteries: Systolic pressure was moderately increased. PA peak pressure: 55 mm Hg (S). - Technically adequate study.   03/2013 Carotid US IMPRESSION:  1. Mild atherosclerotic plaque in the bilateral distal common and proximal internal carotid arteries resulting in less than 50% stenosis bilaterally. No significant interval progression compared to January 2011.  2. Vertebral arteries are patent with antegrade flow.  11/2014 CT PE IMPRESSION: No evidence of pulmonary embolism.  Dependent atelectasis in both lower lobes.  Nonspecific enlarged mediastinal and hilar lymph nodes as above.  Differential diagnosis includes reactive processes, calcium in disease, metastatic disease and lymphoma.  Consider short-term followup CT in 6-8 weeks to reassess.   01/2015 nuclear stress IMPRESSION: 1. No reversible ischemia or infarction.  2. Normal left ventricular wall motion.  3. Left ventricular ejection fraction 61%  4. Low-risk stress test findings*.  05/2015 RHC Mean PA 45, PCWP 5, CI 2.   Labs/Other Tests and Data Reviewed:    EKG:  na  Recent Labs: 07/24/2018: TSH 4.27 11/20/2018: ALT 12; B Natriuretic Peptide 597.0 11/22/2018: Hemoglobin 13.9; Platelets 100 12/15/2018: BUN 26; Creatinine, Ser 1.28; Potassium 4.3; Sodium 138   Recent Lipid Panel Lab Results  Component Value Date/Time   CHOL 123  07/24/2018 10:06 AM   TRIG 33 07/24/2018 10:06 AM   HDL 41 07/24/2018 10:06 AM   CHOLHDL 3.0 07/24/2018 10:06 AM   LDLCALC 73 07/24/2018 10:06 AM    Wt Readings from Last 3 Encounters:  03/24/19 210 lb (95.3 kg)  03/24/19 210 lb (95.3 kg)  03/08/19 207 lb (93.9 kg)     Objective:    Vital Signs:  BP 116/68   Pulse 84   Wt 210 lb (95.3 kg)   SpO2 92%   BMI 26.25 kg/m    Normal affect. Normal speech pattern and tone. Comfortable, no apparent distress. No audible signs of SOB or wheezing.   ASSESSMENT & PLAN:    1.Chronic diastolic HF complicated by pulmonary HTN and RV dysfunction - doing well since discharge in Jan 2020 with fluid overload, has done well on torsemide since that time. Baseline weight around 207-210 lbs which he is holding. No current symptoms - continue current meds  2. Idiopathic pulmonary arterial HTN/Cor pulmonale - followed at [facility]. Continue current triple therapy per there recs from 01/2019 appt    COVID-19 Education: The signs and symptoms of COVID-19 were discussed with the patient and how to seek care for testing (follow up with PCP or arrange E-visit).  The importance of social distancing was discussed today.  Time:   Today, I have spent 13 minutes with the patient with telehealth technology discussing the above problems.     Medication Adjustments/Labs and Tests Ordered: Current medicines are reviewed at length with the patient today.  Concerns regarding medicines are outlined above.   Tests Ordered: No orders of the defined types were placed in this encounter.   Medication Changes: No orders of the defined types were placed in this encounter.   Disposition:  Follow up 4 months  Signed, Carlyle Dolly, MD  03/24/2019 2:08 PM    [facility]

## 2019-03-24 NOTE — Addendum Note (Signed)
Addended by: Eual Fines on: 03/24/2019 12:04 PM   Modules accepted: Orders

## 2019-03-24 NOTE — Progress Notes (Signed)
Subjective:   [name] is a 73 y.o. male who presents for Medicare Annual/Subsequent preventive examination.  Location of Patient: Home Location of Provider: Telehealth Consent was obtain for visit to be over via telehealth. I verified that I am speaking with the correct person using two identifiers.   Review of Systems:    Cardiac Risk Factors include: advanced age (>77mn, >>24women);diabetes mellitus;male gender;hypertension     Objective:    Vitals: BP 116/70    Ht _0  (1.905 m)    Wt 210 lb (95.3 kg)    BMI 26.25 kg/m   Body mass index is 26.25 kg/m.  Advanced Directives 11/20/2018 11/20/2018 03/05/2018 06/03/2017 05/15/2017 08/07/2015 05/31/2015  Does Patient Have a Medical Advance Directive? No No Yes No No Yes No  Type of Advance Directive - - Out of facility DNR (pink MOST or yellow form) - - - -  Would patient like information on creating a medical advance directive? No - Patient declined - - No - Patient declined No - Patient declined - No - patient declined information    Tobacco Social History   Tobacco Use  Smoking Status Former Smoker   Packs/day: 0.50   Start date: 05/04/1964   Last attempt to quit: 02/09/2013   Years since quitting: 6.1  Smokeless Tobacco Never Used     Counseling given: Not Answered   Clinical Intake:  Pre-visit preparation completed: Yes  Pain : No/denies pain     BMI - recorded: 26.25 Nutritional Status: BMI 25 -29 Overweight Nutritional Risks: None Diabetes: Yes Did pt. bring in CBG monitor from home?: No  How often do you need to have someone help you when you read instructions, pamphlets, or other written materials from your doctor or pharmacy?: 1 - Never What is the last grade level you completed in school?: college  Interpreter Needed?: No     Past Medical History:  Diagnosis Date   Asthma    Carotid bruit    COPD (chronic obstructive pulmonary disease) ([place]    Diabetes mellitus without  complication ([place]    DOE (dyspnea on exertion)    2016   ED (erectile dysfunction)    History of renal calculi    Hyperlipidemia    Hypertension    Hypoxia    2016   Pulmonary hypertension ([place]    PVD (peripheral vascular disease) ([place]    Past Surgical History:  Procedure Laterality Date   CARDIAC CATHETERIZATION N/A 05/31/2015   Procedure: Right Heart Cath;  Surgeon: MSherren Mocha MD;  [facility];  Service: Cardiovascular;  Laterality: N/A;   CATARACT EXTRACTION W/PHACO Right 05/20/2017   Procedure: CATARACT EXTRACTION PHACO AND INTRAOCULAR LENS PLACEMENT (IOC);  Surgeon: SRutherford Guys MD;  Location: [facility];  Service: Ophthalmology;  Laterality: Right;  CDE: 7.20   CATARACT EXTRACTION W/PHACO Left 06/03/2017   Procedure: CATARACT EXTRACTION PHACO AND INTRAOCULAR LENS PLACEMENT (IOC);  Surgeon: SRutherford Guys MD;  Location: [facility];  Service: Ophthalmology;  Laterality: Left;  CDE: 4.89   COLONOSCOPY   11/25/2007   RFIE:PPIRJJrectum and splenic flexure and sigmoid polyps/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 tubular adenomas   COLONOSCOPY N/A 03/06/2015   ROAC:ZYSAYTKZScoli/multiple rectal and colon polyps, tubular adenomas. Next colonoscopy April 2019.   left wrist     [place]IPSY  2010   [place]  [place]    Family History  Problem Relation Age of Onset   Diabetes Sister  Social History   Socioeconomic History   Marital status: Married    Spouse name: Not on file   Number of children: Not on file   Years of education: Not on file   Highest education level: Not on file  Occupational History   Not on file  Social Needs   Financial resource strain: Not hard at all   Food insecurity:    Worry: Never true    Inability: Never true   Transportation needs:    Medical: No    Non-medical: No  Tobacco Use   Smoking status: Former Smoker    Packs/day: 0.50    Start date: 05/04/1964     Last attempt to quit: 02/09/2013    Years since quitting: 6.1   Smokeless tobacco: Never Used  Substance and Sexual Activity   Alcohol use: No    Alcohol/week: 0.0 standard drinks   Drug use: No   Sexual activity: Not on file  Lifestyle   Physical activity:    Days per week: 0 days    Minutes per session: 0 min   Stress: Not at all  Relationships   Social connections:    Talks on phone: More than three times a week    Gets together: Once a week    Attends religious service: More than 4 times per year    Active member of club or organization: Yes    Attends meetings of clubs or organizations: More than 4 times per year    Relationship status: Married  Other Topics Concern   Not on file  Social History Narrative   Not on file    Outpatient Encounter Medications as of 03/24/2019  Medication Sig   albuterol (PROVENTIL HFA;VENTOLIN HFA) 108 (90 Base) MCG/ACT inhaler Inhale 2 puffs into the lungs as directed.   ambrisentan (LETAIRIS) 5 MG tablet Take 10 mg by mouth daily.    amLODipine (NORVASC) 5 MG tablet TAKE 1 TABLET BY MOUTH EVERY DAY   atorvastatin (LIPITOR) 10 MG tablet TAKE 1 TABLET BY MOUTH EVERY DAY   cetirizine (ZYRTEC) 10 MG tablet Take 10 mg by mouth daily as needed.    hydrOXYzine (ATARAX/VISTARIL) 10 MG tablet One tablet at bedtime for itching, as needed   potassium chloride (K-DUR) 10 MEQ tablet TAKE TWO TABLETS ONCE DAILY WITH FUROSEMIDE   Tadalafil, PAH, (ADCIRCA) 20 MG TABS Take 2 tablets by mouth daily.   torsemide (DEMADEX) 20 MG tablet Take 1 tablet (20 mg total) by mouth 2 (two) times daily.   Treprostinil Diolamine ER 2.5 MG TBCR Inhale 2.5 mg into the lungs 4 (four) times daily.   No facility-administered encounter medications on file as of 03/24/2019.     Activities of Daily Living In your present state of health, do you have any difficulty performing the following activities: 03/24/2019 11/20/2018  Hearing? N N  Vision? N N    Difficulty concentrating or making decisions? N N  Walking or climbing stairs? Y N  Dressing or bathing? N Y  Doing errands, shopping? N N  Preparing Food and eating ? N -  Using the Toilet? N -  In the past six months, have you accidently leaked urine? N -  Do you have problems with loss of bowel control? N -  Managing your Medications? N -  Managing your Finances? N -  Some recent data might be hidden    Patient Care Team: Fayrene Helper, MD as PCP - General Branch, Alphonse Guild, MD as PCP -  Cardiology (Cardiology) Cassandria Anger, MD as Consulting Physician (Endocrinology) Gala Romney Cristopher Estimable, MD as Consulting Physician (Gastroenterology) Test, Lazaro Arms, MD (Pulmonary Disease) Delfino Lovett, MD as Referring Physician (Pulmonary Disease) Erby Pian, MD as Referring Physician (Specialist)   Assessment:   This is a routine wellness examination for [employer]  Exercise Activities and Dietary recommendations Current Exercise Habits: The patient does not participate in regular exercise at present, Exercise limited by: cardiac condition(s)  Goals   None     Fall Risk Fall Risk  03/24/2019 12/02/2018 11/12/2018 07/28/2018 03/18/2018  Falls in the past year? 0 0 0 No No  Number falls in past yr: - 0 0 - -  Comment - - - - -  Injury with Fall? 0 0 0 - -   Is the patient's home free of loose throw rugs in walkways, pet beds, electrical cords, etc?   yes      Grab bars in the bathroom? no      Handrails on the stairs?   yes      Adequate lighting?   yes  Timed Get Up and Go Performed:  Unable to perform telemedicine Depression Screen PHQ 2/9 Scores 03/24/2019 12/02/2018 11/12/2018 07/28/2018  PHQ - 2 Score 0 0 0 1  PHQ- 9 Score - - - 7    Cognitive Function     6CIT Screen 03/24/2019 03/05/2018  What Year? 0 points 0 points  What month? 0 points 0 points  What time? 0 points 0 points  Count back from 20 0 points 0 points  Months in reverse 0 points 0 points  Repeat  phrase 2 points 0 points  Total Score 2 0    Immunization History  Administered Date(s) Administered   Influenza, High Dose Seasonal PF 11/12/2018   Pneumococcal Conjugate-13 06/14/2014   Pneumococcal Polysaccharide-23 03/01/2008, 03/24/2013   Tdap 07/02/2017   Zoster 06/06/2008    Qualifies for Shingles Vaccine?  completed  Screening Tests Health Maintenance  Topic Date Due   FOOT EXAM  07/05/2018   URINE MICROALBUMIN  03/14/2019   HEMOGLOBIN A1C  04/21/2019   INFLUENZA VACCINE  06/12/2019   OPHTHALMOLOGY EXAM  12/23/2019   COLONOSCOPY  03/05/2025   TETANUS/TDAP  07/03/2027   Hepatitis C Screening  Completed   PNA vac Low Risk Adult  Completed   Cancer Screenings: Lung: Low Dose CT Chest recommended if Age 24-80 years, 30 pack-year currently smoking OR have quit w/in 15years. Patient does not qualify. Colorectal:  Due in 2026  Additional Screenings:   Hepatitis C Screening: complete      Plan:      1. Encounter for Medicare annual wellness exam  I have personally reviewed and noted the following in the patients chart:    Medical and social history  Use of alcohol, tobacco or illicit drugs   Current medications and supplements  Functional ability and status  Nutritional status  Physical activity  Advanced directives  List of other physicians  Hospitalizations, surgeries, and ER visits in previous 12 months  Vitals  Screenings to include cognitive, depression, and falls  Referrals and appointments  In addition, I have reviewed and discussed with patient certain preventive protocols, quality metrics, and best practice recommendations. A written personalized care plan for preventive services as well as general preventive health recommendations were provided to patient.   I provided 20 minutes of non-face-to-face time during this encounter.   Perlie Mayo, NP  03/24/2019

## 2019-05-07 ENCOUNTER — Other Ambulatory Visit: Payer: Self-pay | Admitting: Family Medicine

## 2019-05-19 ENCOUNTER — Other Ambulatory Visit: Payer: Self-pay | Admitting: Specialist

## 2019-05-19 ENCOUNTER — Other Ambulatory Visit (HOSPITAL_COMMUNITY): Payer: Self-pay | Admitting: Specialist

## 2019-05-19 DIAGNOSIS — R590 Localized enlarged lymph nodes: Secondary | ICD-10-CM

## 2019-05-28 ENCOUNTER — Other Ambulatory Visit: Payer: Self-pay

## 2019-05-28 ENCOUNTER — Ambulatory Visit
Admission: RE | Admit: 2019-05-28 | Discharge: 2019-05-28 | Disposition: A | Discharge status: Home / Self Care | Payer: Medicare Other | Source: Ambulatory Visit | Attending: Specialist | Admitting: Specialist

## 2019-05-28 ENCOUNTER — Ambulatory Visit: Payer: Medicare Other

## 2019-05-28 DIAGNOSIS — R590 Localized enlarged lymph nodes: Secondary | ICD-10-CM | POA: Diagnosis present

## 2019-06-25 ENCOUNTER — Other Ambulatory Visit: Payer: Self-pay | Admitting: Family Medicine

## 2019-07-18 ENCOUNTER — Other Ambulatory Visit: Payer: Self-pay | Admitting: Family Medicine

## 2019-08-03 ENCOUNTER — Telehealth: Payer: Self-pay | Admitting: Family Medicine

## 2019-08-03 DIAGNOSIS — E1169 Type 2 diabetes mellitus with other specified complication: Secondary | ICD-10-CM

## 2019-08-03 DIAGNOSIS — I10 Essential (primary) hypertension: Secondary | ICD-10-CM

## 2019-08-03 DIAGNOSIS — E785 Hyperlipidemia, unspecified: Secondary | ICD-10-CM

## 2019-08-03 NOTE — Addendum Note (Signed)
Addended by: Eual Fines on: 08/03/2019 10:50 AM   Modules accepted: Orders

## 2019-08-03 NOTE — Telephone Encounter (Signed)
Labs ordered.

## 2019-08-03 NOTE — Telephone Encounter (Signed)
Please send lab orders for PT

## 2019-08-10 LAB — COMPLETE METABOLIC PANEL WITH GFR
AG Ratio: 1.2 (calc) (ref 1.0–2.5)
ALT: 8 U/L — ABNORMAL LOW (ref 9–46)
AST: 14 U/L (ref 10–35)
Albumin: 4 g/dL (ref 3.6–5.1)
Alkaline phosphatase (APISO): 70 U/L (ref 35–144)
BUN: 25 mg/dL (ref 7–25)
CO2: 23 mmol/L (ref 20–32)
Calcium: 9.5 mg/dL (ref 8.6–10.3)
Chloride: 109 mmol/L (ref 98–110)
Creat: 1.15 mg/dL (ref 0.70–1.18)
GFR, Est African American: 72 mL/min/{1.73_m2} (ref >=60)
GFR, Est Non African American: 62 mL/min/{1.73_m2} (ref >=60)
Globulin: 3.4 g/dL (calc) (ref 1.9–3.7)
Glucose, Bld: 138 mg/dL — ABNORMAL HIGH (ref 65–99)
Potassium: 4.4 mmol/L (ref 3.5–5.3)
Sodium: 138 mmol/L (ref 135–146)
Total Bilirubin: 1.4 mg/dL — ABNORMAL HIGH (ref 0.2–1.2)
Total Protein: 7.4 g/dL (ref 6.1–8.1)

## 2019-08-10 LAB — LIPID PANEL
Cholesterol: 116 mg/dL (ref <200)
HDL: 39 mg/dL — ABNORMAL LOW (ref >=40)
LDL Cholesterol (Calc): 65 mg/dL (calc)
Non-HDL Cholesterol (Calc): 77 mg/dL (calc) (ref <130)
Total CHOL/HDL Ratio: 3 (calc) (ref <5.0)
Triglycerides: 42 mg/dL (ref <150)

## 2019-08-10 LAB — HEMOGLOBIN A1C
Hgb A1c MFr Bld: 7.6 % of total Hgb — ABNORMAL HIGH (ref <5.7)
Mean Plasma Glucose: 171 (calc)
eAG (mmol/L): 9.5 (calc)

## 2019-08-17 ENCOUNTER — Encounter: Payer: Self-pay | Admitting: Family Medicine

## 2019-08-17 ENCOUNTER — Ambulatory Visit (INDEPENDENT_AMBULATORY_CARE_PROVIDER_SITE_OTHER): Payer: Medicare Other | Admitting: Family Medicine

## 2019-08-17 ENCOUNTER — Telehealth: Payer: Self-pay | Admitting: *Deleted

## 2019-08-17 ENCOUNTER — Other Ambulatory Visit: Payer: Self-pay

## 2019-08-17 VITALS — BP 118/82 | Temp 98.4°F | Resp 17 | Ht 75.0 in | Wt 225.1 lb

## 2019-08-17 DIAGNOSIS — Z23 Encounter for immunization: Secondary | ICD-10-CM | POA: Diagnosis not present

## 2019-08-17 DIAGNOSIS — I272 Pulmonary hypertension, unspecified: Secondary | ICD-10-CM | POA: Diagnosis not present

## 2019-08-17 DIAGNOSIS — R238 Other skin changes: Secondary | ICD-10-CM

## 2019-08-17 DIAGNOSIS — I739 Peripheral vascular disease, unspecified: Secondary | ICD-10-CM

## 2019-08-17 DIAGNOSIS — R0902 Hypoxemia: Secondary | ICD-10-CM

## 2019-08-17 DIAGNOSIS — R0600 Dyspnea, unspecified: Secondary | ICD-10-CM

## 2019-08-17 DIAGNOSIS — Z0000 Encounter for general adult medical examination without abnormal findings: Secondary | ICD-10-CM | POA: Diagnosis not present

## 2019-08-17 DIAGNOSIS — I5032 Chronic diastolic (congestive) heart failure: Secondary | ICD-10-CM

## 2019-08-17 DIAGNOSIS — E1169 Type 2 diabetes mellitus with other specified complication: Secondary | ICD-10-CM

## 2019-08-17 DIAGNOSIS — Z7409 Other reduced mobility: Secondary | ICD-10-CM

## 2019-08-17 DIAGNOSIS — R0609 Other forms of dyspnea: Secondary | ICD-10-CM

## 2019-08-17 DIAGNOSIS — E785 Hyperlipidemia, unspecified: Secondary | ICD-10-CM

## 2019-08-17 MED ORDER — UNABLE TO FIND: 0 refills | Status: AC

## 2019-08-17 MED ORDER — METOLAZONE 2.5 MG PO TABS: 2.5000 mg | ORAL_TABLET | Freq: Every day | ORAL | 0 refills | Status: AC

## 2019-08-17 NOTE — Patient Instructions (Signed)
Wellness with MD ([place]) in November or December, call if you need me sooner   Pneumonia 23 and flu vaccine today  Excellent BP, cholesterol and blood sugar  You are in heart failure, take zaroxolyn 1 daily for 5 days only and continue demadex as you are doing . I am referring you to Dr Harl Bowie for follow up  Microalb from office today  Foot exam shows poor circulation and  poor sensation please check your feet regularly  You will be referred to Dermatology in [place] if they accept your insurance, we will let you kmow  Knee high compression hose will be sent to [facility] will be referred for evaluation for power wheelchair by PT, ( if appropriate) . Your heart and lung disease qualifies you for this ,I believe just need their input   Thanks for choosing [facility], we consider it a privelige to serve you.

## 2019-08-17 NOTE — Progress Notes (Signed)
[name]     MRN: [id number]      DOB: 1946/10/02   HPI: Patient is in for annual physical exam. C/o worsening shortness of breath limiting safe mobility both inside and outside of his home. He has severe lung disease and relies on oxygen continually, with extremely poor exercise tolerance. He also has severe heart failure which also limits his mobility significantly.  Has significant pVD which limits distance he can walk Upper extremity strength is sufficient to grip and safely manipulate the chair Recent labs, are reviewed.and are excellent Immunization is reviewed , and  updated .    PE; BP 118/82   Temp 98.4 F (36.9 C) (Oral)   Resp 17   Ht 6' 3" (1.905 m)   Wt 225 lb 1.9 oz (102.1 kg)   SpO2 91% Comment: on setting 6 on OCD  BMI 28.14 kg/m   Pleasant male, alert and oriented x 3, in  cardio-pulmonary distress.oxygen dependant continually  Afebrile. HEENT No facial trauma or asymetry. Sinuses non tender. EOMI, . External ears normal,  Neck: supple, no adenopathy,JVD or thyromegaly.No bruits.  Chest: Clear to ascultation bilaterally.No crackles or wheezes.markdly decreased air entry throughout  Cardiovascular system; Heart sounds normal,  S1 and  S2 ,no S3.  No murmur, or thrill.  Peripheral pulses diminishedl.  Abdomen: Soft, non tender    Musculoskeletal exam: Decreased  ROM of spine, hips , shoulders and knees.    Neurologic: Cranial nerves 2 to 12 intact. Power grade 4 in upper and lower extremities tone ,decreased  disturbance in gait. No tremor.  Skin: Intact, erythema  And  vesicles  Noted on both legs, no open ulcers. Hyperpigmentation of lower exrtremities present and scaling rash on feet Psych; Normal mood and affect. Judgement and concentration normal   Assessment & Plan:  Annual physical exam Annual exam as documented. Counseling done  re healthy lifestyle involving  heart healthy diet, and attaining healthy weight.The  importance of adequate sleep also discussed. Regular seat belt use and home safety, is also discussed. Changes in health habits are decided on by the patient with goals and time frames  set for achieving them. Immunization and cancer screening needs are specifically addressed at this visit.   PERIPHERAL VASCULAR DISEASE increased swelling , and skin changes , with rubor and vesicles noted in both lower extremities, knee high hose prescribed and referral to vascular surgery for re eval  Decreased mobility and endurance Severe and progressive heart and lung disease resulting in minimal exercise tolerance and need for continual oxygen. Will benefit from and needs a power wheelchair/ scooter for improved quaity of life , refer to PT for evaluation

## 2019-08-17 NOTE — Telephone Encounter (Signed)
error 

## 2019-08-17 NOTE — Assessment & Plan Note (Signed)
Annual exam as documented. Counseling done  re healthy lifestyle involving  heart healthy diet, and attaining healthy weight.The importance of adequate sleep also discussed. Regular seat belt use and home safety, is also discussed. Changes in health habits are decided on by the patient with goals and time frames  set for achieving them. Immunization and cancer screening needs are specifically addressed at this visit.

## 2019-08-18 ENCOUNTER — Other Ambulatory Visit (HOSPITAL_COMMUNITY)
Admission: RE | Admit: 2019-08-18 | Discharge: 2019-08-18 | Disposition: A | Discharge status: Home / Self Care | Payer: Medicare Other | Source: Ambulatory Visit | Attending: Family Medicine | Admitting: Family Medicine

## 2019-08-18 DIAGNOSIS — E785 Hyperlipidemia, unspecified: Secondary | ICD-10-CM | POA: Insufficient documentation

## 2019-08-18 DIAGNOSIS — E1169 Type 2 diabetes mellitus with other specified complication: Secondary | ICD-10-CM | POA: Insufficient documentation

## 2019-08-19 LAB — MICROALBUMIN / CREATININE URINE RATIO
Creatinine, Urine: 122.6 mg/dL
Microalb Creat Ratio: 39 mg/g creat — ABNORMAL HIGH (ref 0–29)
Microalb, Ur: 47.9 ug/mL — ABNORMAL HIGH

## 2019-08-22 ENCOUNTER — Encounter: Payer: Self-pay | Admitting: Family Medicine

## 2019-08-22 DIAGNOSIS — R238 Other skin changes: Secondary | ICD-10-CM | POA: Insufficient documentation

## 2019-08-22 DIAGNOSIS — Z7409 Other reduced mobility: Secondary | ICD-10-CM | POA: Insufficient documentation

## 2019-08-22 NOTE — Assessment & Plan Note (Signed)
Mr. Zenon is reminded of the importance of commitment to daily physical activity for 30 minutes or more, as able and the need to limit carbohydrate intake to 30 to 60 grams per meal to help with blood sugar control.   Controlled on diet only  Mr. Heinle is reminded of the importance of daily foot exam, annual eye examination, and good blood sugar, blood pressure and cholesterol control.  Diabetic Labs Latest Ref Rng & Units 08/18/2019 08/09/2019 12/15/2018 11/23/2018 11/22/2018  HbA1c <5.7 % of total Hgb - 7.6(H) - - -  Microalbumin Not Estab. ug/mL 47.9(H) - - - -  Micro/Creat Ratio 0 - 29 mg/g creat 39(H) - - - -  Chol <200 mg/dL - 116 - - -  HDL > OR = 40 mg/dL - 39(L) - - -  Calc LDL mg/dL (calc) - 65 - - -  Triglycerides <150 mg/dL - 42 - - -  Creatinine 0.70 - 1.18 mg/dL - 1.15 1.28(H) 1.37(H) 1.40(H)   BP/Weight 08/17/2019 03/24/2019 03/24/2019 03/08/2019 12/15/2018 12/02/2018 11/13/1592  Systolic BP 585 929 244 628 638 177 116  Diastolic BP 82 70 68 70 56 60 58  Wt. (Lbs) 225.12 210 210 207 207 202.12 -  BMI 28.14 26.25 26.25 25.87 25.87 25.26 -   Foot/eye exam completion dates Latest Ref Rng & Units 08/17/2019 12/22/2018  Eye Exam No Retinopathy - No Retinopathy  Foot Form Completion - Done -

## 2019-08-22 NOTE — Assessment & Plan Note (Signed)
Erythema and vesicular rash on both legs, pt has well controlled diabetes and severe PVD, refer to dermatology,

## 2019-08-22 NOTE — Assessment & Plan Note (Signed)
increased swelling , and skin changes , with rubor and vesicles noted in both lower extremities, knee high hose prescribed and referral to vascular surgery for re eval

## 2019-08-22 NOTE — Assessment & Plan Note (Signed)
Severe and progressive heart and lung disease resulting in minimal exercise tolerance and need for continual oxygen. Will benefit from and needs a power wheelchair/ scooter for improved quaity of life , refer to PT for evaluation

## 2019-09-08 ENCOUNTER — Other Ambulatory Visit: Payer: Self-pay

## 2019-09-08 ENCOUNTER — Ambulatory Visit (HOSPITAL_COMMUNITY): Payer: Medicare Other | Attending: Family Medicine

## 2019-09-08 ENCOUNTER — Encounter (HOSPITAL_COMMUNITY): Payer: Self-pay

## 2019-09-08 DIAGNOSIS — R29898 Other symptoms and signs involving the musculoskeletal system: Secondary | ICD-10-CM

## 2019-09-08 NOTE — Therapy (Signed)
[facility] [address] [place], [place], [address] Phone: [phone]   Fax:  [phone]  Occupational Therapy Wheelchair Evaluation  Patient Details  Name: [name] MRN: [id number] Date of Birth: 1946/05/06 Referring Provider (OT): Tula Nakayama, MD   Encounter Date: 09/08/2019  OT End of Session - 09/08/19 1411    Visit Number  1    Number of Visits  1    Authorization Type  [employer] medicare    OT Start Time  1300    OT Stop Time  1400    OT Time Calculation (min)  60 min    Activity Tolerance  Patient tolerated treatment well    Behavior During Therapy  [facility] for tasks assessed/performed       Past Medical History:  Diagnosis Date  . Asthma   . Carotid bruit   . COPD (chronic obstructive pulmonary disease) ([place])   . Diabetes mellitus without complication ([place])   . DOE (dyspnea on exertion)    2016  . ED (erectile dysfunction)   . History of renal calculi   . Hyperlipidemia   . Hypertension   . Hypoxia    2016  . Pulmonary hypertension ([place])   . PVD (peripheral vascular disease) ([place])     Past Surgical History:  Procedure Laterality Date  . CARDIAC CATHETERIZATION N/A 05/31/2015   Procedure: Right Heart Cath;  Surgeon: Sherren Mocha, MD;  Location: [facility];  Service: Cardiovascular;  Laterality: N/A;  . CATARACT EXTRACTION W/PHACO Right 05/20/2017   Procedure: CATARACT EXTRACTION PHACO AND INTRAOCULAR LENS PLACEMENT (IOC);  Surgeon: Rutherford Guys, MD;  Location: [facility];  Service: Ophthalmology;  Laterality: Right;  CDE: 7.20  . CATARACT EXTRACTION W/PHACO Left 06/03/2017   Procedure: CATARACT EXTRACTION PHACO AND INTRAOCULAR LENS PLACEMENT (IOC);  Surgeon: Rutherford Guys, MD;  Location: [facility];  Service: Ophthalmology;  Laterality: Left;  CDE: 4.89  . 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  tubular adenomas  . COLONOSCOPY N/A 03/06/2015   MVE:[id number] coli/multiple rectal and colon polyps, tubular adenomas. Next colonoscopy April 2019.  [name] left wrist    . LITHOTRIPSY  2010  . [place]   right     There were no vitals filed for this visit.  Subjective Assessment - 09/08/19 1409    Currently in Pain?  No/denies        [facility] OT Assessment - 09/08/19 1410      Assessment   Medical Diagnosis  Wheelchair evaluation    Referring Provider (OT)  Tula Nakayama, MD          Date: 09/08/2019 Patient Name: [name] Address: [address] [place], [place] [address] DOB: [birth year]      [name] was seen today in this clinic for a powered wheelchair evaluation. [name] has a past medical history that includes peripheral vascular disease, CHF, asthma, COPD, diabetes, DOE, pulmonary HTN, hypoxia, CKD stage 2, hyperlipidemia, BLE edema, severe progressive heart and lung disease.  [name] lives with his wife a split-level home. There are 4 steps with a railing to enter/exit and 4 steps with a railing to reach the second level. No ramp is currently in place although [name] has the resources to get one when he receives his powered wheelchair. [name] is able to complete bathing, dressing, toileting, and grooming tasks at Modified independence level. He requires  additional rest breaks or pacing due to difficulty breathing and low activity tolerance. [name] reports that he receives assistance with yard work and housekeeping tasks. He states that he enjoys the role of meal prep although requires frequent sitting rest breaks due to decreased activity tolerance and shortness of breath with any type of activity level. [name] reports that he is supposed to be on continuous oxygen at 6 liters and at the moment is only able to utilize oxygen at home due to a malfunctioning portable battery.          A FULL PHYSICAL ASSESSMENT REVEALS THE FOLLOWING     Existing Equipment: Nebulizer, home oxygen concentrator   Transfers: All functional transfers from surface to surface; such as the chair to chair; are completed at Modified independence.    Head and Neck: Decreased cervical ROM is demonstrated for extension and left and right rotation.  Trunk and Pelvis: Trunk: A/ROM is [facility] for right and left rotation.    Hip Able to achieve functional A/ROM bilateral hip flexion. MMT: 4+/5 for right and left hip flexion and hip abduction/adduction.  Knees:  A/ROM WFL. MMT: 4+/5 for left and right flexion and extension.  Feet and Ankles: A/ROM WFL. MMT: right ankle dorsiflexion: 4+/5, left ankle dorsiflexion: 4-/5.   Upper Extremities: BUE shoulder, elbow, and wrist A/ROM is WFL in all ranges. Bilateral weak gross grasp is demonstrated with right hand less than left. MMT is as follows: right shoulder flexion and abduction: 3+/5, internal and external rotation: 3+/5, elbow flexion: 5/5, elbow extension: 3+/5. Left shoulder flexion and abduction: 4-/5, internal and external rotation: 4-/5, elbow flexion: 5/5, elbow extension: 4-/5.  Weight Shifting Ability:  Weight shifting ability is adequate to what is needed to prevent skin breakdown.  Skin Integrity:  No history of skin breakdown.    Cognition:  WFL  Activity Tolerance: [name] reports that his activity tolerance is greatly reduced. He states he is able to stand while completing a meal prep task for approximately 10 minutes before needing a seated rest break. When walking, he can tolerate 3-4 minutes before needing to sit and catch his breathe.    GOALS/OBJECTIVE OF SEATING INTERVENTION:  Recommendation: [name] would benefit from a powered wheelchair for use in his home and in the community. [name] is limited by his decreased BUE strength, decreased grip strength, and pulmonary capacity causing him to be unable to self-propel a manual wheelchair.  A scooter would not be beneficial  for [name] as he does not have adequate space in his home for the required turning radius. [name] is motivated and willing to use his power wheelchair and is physically and mentally able to operate his power wheelchair. A power wheelchair would increase [name] safety and independence with daily activities and his quality of life.  If you require any further information concerning [name] positioning, independence or mobility needs; or any further information why a lesser device will not work, please do not hesitate to contact me at [place], [address] [place] [place],  [phone].   Thank you for this referral,   ____________________        Ailene Ravel OTR/L, CBIS  [phone] (main) [email or web address]                       Visit Diagnosis: Other symptoms and signs involving the musculoskeletal system    Problem List Patient Active Problem List  Diagnosis Date Noted  . Decreased mobility and endurance 08/22/2019  . Rash, vesicular 08/22/2019  . Annual physical exam 08/17/2019  . CHF (congestive heart failure) ([place]) 11/20/2018  . CKD (chronic kidney disease) stage 2, GFR 60-89 ml/min 11/20/2018  . Edema 11/12/2018  . Callus of foot 03/18/2018  . Skin tag 12/15/2016  . Pulmonary hypertension ([place])   . Tubular adenoma of colon 05/12/2015  . Vitamin D deficiency 04/05/2015  . 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 CAD in native artery 01/02/2015  . Mediastinal adenopathy 12/07/2014  . Hypoxia 12/06/2014  . DOE (dyspnea on exertion) 12/06/2014  . Poor vision 12/06/2014  . Seasonal allergies 01/18/2014  . Type 2 diabetes mellitus with hyperlipidemia ([place]) 06/28/2013  . Diabetic neuropathy ([place]) 03/24/2013  . Carotid bruit 03/24/2013  . Kidney stone on left side 01/25/2011  . PERIPHERAL VASCULAR DISEASE 05/31/2010  . FATIGUE 01/25/2010   . OTH NONSPECIFIC ABNORM CV SYSTEM FUNCTION STUDY 11/07/2009  . Hyperlipidemia with target LDL less than 100 12/14/2008  . ERECTILE DYSFUNCTION 12/14/2008  . Essential hypertension 03/09/2008   Ailene Ravel, OTR/L,CBIS  [phone]  09/08/2019, 2:12 PM  [facility] [address] [place], [place], [address] Phone: [phone]   Fax:  [phone]  Name: BRISCOE DANIELLO MRN: [id number] Date of Birth: 06/01/1946

## 2019-09-09 ENCOUNTER — Telehealth: Payer: Self-pay

## 2019-09-09 NOTE — Telephone Encounter (Signed)
Pt is calling regarding is power wheelchair

## 2019-09-13 ENCOUNTER — Other Ambulatory Visit: Payer: Self-pay

## 2019-09-13 MED ORDER — UNABLE TO FIND: 0 refills | Status: AC

## 2019-09-13 NOTE — Telephone Encounter (Signed)
Called patient to let him know that order sent for power wheelchair and that he could call them and get an update on how long it would take to process the order

## 2019-09-13 NOTE — Telephone Encounter (Signed)
Power chair order faxed in

## 2019-09-14 NOTE — Telephone Encounter (Signed)
Aware that it was sent to [employer]

## 2019-10-12 DEATH — deceased

## 2020-03-28 ENCOUNTER — Encounter: Payer: Medicare Other | Admitting: Family Medicine
